# Patient Record
Sex: Female | Born: 1990 | Race: White | Hispanic: No | Marital: Single | Smoking: Never smoker
Health system: Southern US, Community
[De-identification: ages and names within clinical notes are randomized; demographics above are authoritative.]

## PROBLEM LIST (undated history)

## (undated) ENCOUNTER — Inpatient Hospital Stay (HOSPITAL_COMMUNITY): Payer: Self-pay

## (undated) ENCOUNTER — Emergency Department (HOSPITAL_COMMUNITY): Admission: EM | Payer: Managed Care, Other (non HMO)

## (undated) DIAGNOSIS — F319 Bipolar disorder, unspecified: Secondary | ICD-10-CM

## (undated) DIAGNOSIS — F419 Anxiety disorder, unspecified: Secondary | ICD-10-CM

## (undated) DIAGNOSIS — F329 Major depressive disorder, single episode, unspecified: Secondary | ICD-10-CM

## (undated) DIAGNOSIS — S83519A Sprain of anterior cruciate ligament of unspecified knee, initial encounter: Secondary | ICD-10-CM

## (undated) DIAGNOSIS — F429 Obsessive-compulsive disorder, unspecified: Secondary | ICD-10-CM

## (undated) DIAGNOSIS — M419 Scoliosis, unspecified: Secondary | ICD-10-CM

## (undated) DIAGNOSIS — F32A Depression, unspecified: Secondary | ICD-10-CM

## (undated) DIAGNOSIS — F22 Delusional disorders: Secondary | ICD-10-CM

## (undated) HISTORY — PX: COSMETIC SURGERY: SHX468

---
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
## 2010-10-13 ENCOUNTER — Emergency Department (HOSPITAL_COMMUNITY)
Admission: EM | Admit: 2010-10-13 | Discharge: 2010-10-13 | Payer: Self-pay | Source: Home / Self Care | Admitting: Family Medicine

## 2010-12-06 ENCOUNTER — Other Ambulatory Visit: Payer: Self-pay | Admitting: Obstetrics and Gynecology

## 2010-12-06 DIAGNOSIS — N6320 Unspecified lump in the left breast, unspecified quadrant: Secondary | ICD-10-CM

## 2010-12-13 ENCOUNTER — Other Ambulatory Visit: Payer: Self-pay

## 2011-07-31 LAB — COMPREHENSIVE METABOLIC PANEL
Albumin: 4
BUN: 13
Calcium: 9.1
Creatinine, Ser: 0.78
Glucose, Bld: 86
Total Protein: 6.5

## 2011-07-31 LAB — CBC
HCT: 37.3
Hemoglobin: 13.1
MCHC: 35.2
MCV: 85.7
Platelets: 263
RDW: 13.1

## 2011-07-31 LAB — DIFFERENTIAL
Eosinophils Absolute: 0
Eosinophils Relative: 1
Lymphs Abs: 1.4
Monocytes Relative: 7

## 2011-07-31 LAB — POCT I-STAT, CHEM 8
Chloride: 108
Creatinine, Ser: 1
Glucose, Bld: 82
Potassium: 3.6

## 2011-07-31 LAB — RAPID URINE DRUG SCREEN, HOSP PERFORMED
Benzodiazepines: NOT DETECTED
Cocaine: NOT DETECTED
Tetrahydrocannabinol: NOT DETECTED

## 2011-07-31 LAB — ETHANOL: Alcohol, Ethyl (B): 16 — ABNORMAL HIGH

## 2011-08-08 ENCOUNTER — Ambulatory Visit
Admission: RE | Admit: 2011-08-08 | Discharge: 2011-08-08 | Disposition: A | Discharge status: Home / Self Care | Payer: Self-pay | Source: Ambulatory Visit | Attending: Obstetrics and Gynecology | Admitting: Obstetrics and Gynecology

## 2011-08-08 DIAGNOSIS — N6320 Unspecified lump in the left breast, unspecified quadrant: Secondary | ICD-10-CM

## 2012-02-06 ENCOUNTER — Encounter (HOSPITAL_COMMUNITY): Payer: Self-pay | Admitting: Family Medicine

## 2012-02-06 ENCOUNTER — Emergency Department (HOSPITAL_COMMUNITY)
Admission: EM | Admit: 2012-02-06 | Discharge: 2012-02-07 | Disposition: A | Discharge status: Home / Self Care | Payer: Managed Care, Other (non HMO) | Attending: Emergency Medicine | Admitting: Emergency Medicine

## 2012-02-06 DIAGNOSIS — F329 Major depressive disorder, single episode, unspecified: Secondary | ICD-10-CM

## 2012-02-06 DIAGNOSIS — F3289 Other specified depressive episodes: Secondary | ICD-10-CM | POA: Insufficient documentation

## 2012-02-06 HISTORY — DX: Obsessive-compulsive disorder, unspecified: F42.9

## 2012-02-06 HISTORY — DX: Anxiety disorder, unspecified: F41.9

## 2012-02-06 HISTORY — DX: Delusional disorders: F22

## 2012-02-06 LAB — CBC
Hemoglobin: 13.9 g/dL (ref 12.0–15.0)
MCHC: 33.9 g/dL (ref 30.0–36.0)
Platelets: 327 10*3/uL (ref 150–400)
RBC: 4.79 MIL/uL (ref 3.87–5.11)

## 2012-02-06 LAB — COMPREHENSIVE METABOLIC PANEL
ALT: 17 U/L (ref 0–35)
AST: 19 U/L (ref 0–37)
Albumin: 4.7 g/dL (ref 3.5–5.2)
Alkaline Phosphatase: 77 U/L (ref 39–117)
GFR calc Af Amer: >90 mL/min (ref >90)
Glucose, Bld: 84 mg/dL (ref 70–99)
Potassium: 3.1 mEq/L — ABNORMAL LOW (ref 3.5–5.1)
Sodium: 137 mEq/L (ref 135–145)
Total Protein: 7.9 g/dL (ref 6.0–8.3)

## 2012-02-06 LAB — ACETAMINOPHEN LEVEL: Acetaminophen (Tylenol), Serum: <15 ug/mL (ref 10–30)

## 2012-02-06 LAB — SALICYLATE LEVEL: Salicylate Lvl: <2 mg/dL — ABNORMAL LOW (ref 2.8–20.0)

## 2012-02-06 NOTE — ED Notes (Addendum)
Patient states "I am here to get help so my mom can be happy." Mom states that she is seeing changes in the patient. Patient states that she got her meds today and "I should be fine."

## 2012-02-07 LAB — URINE MICROSCOPIC-ADD ON

## 2012-02-07 LAB — URINALYSIS, ROUTINE W REFLEX MICROSCOPIC
Ketones, ur: 15 mg/dL — AB
Leukocytes, UA: NEGATIVE
Nitrite: NEGATIVE
Protein, ur: NEGATIVE mg/dL

## 2012-02-07 LAB — RAPID URINE DRUG SCREEN, HOSP PERFORMED: Amphetamines: NOT DETECTED

## 2012-02-07 MED ORDER — ARIPIPRAZOLE 5 MG PO TABS
5.0000 mg | ORAL_TABLET | Freq: Every day | ORAL | Status: DC
  Filled 2012-02-07: qty 1

## 2012-02-07 MED ORDER — ATOMOXETINE HCL 25 MG PO CAPS: 25.0000 mg | ORAL_CAPSULE | Freq: Every day | ORAL | Status: DC

## 2012-02-07 MED ORDER — LORAZEPAM 1 MG PO TABS: 1.0000 mg | ORAL_TABLET | Freq: Three times a day (TID) | ORAL | Status: DC | PRN

## 2012-02-07 MED ORDER — ZOLPIDEM TARTRATE 5 MG PO TABS: 5.0000 mg | ORAL_TABLET | Freq: Every evening | ORAL | Status: DC | PRN

## 2012-02-07 MED ORDER — ACETAMINOPHEN 325 MG PO TABS: 650.0000 mg | ORAL_TABLET | ORAL | Status: DC | PRN

## 2012-02-07 MED ORDER — ONDANSETRON HCL 4 MG PO TABS: 4.0000 mg | ORAL_TABLET | Freq: Three times a day (TID) | ORAL | Status: DC | PRN

## 2012-02-07 MED ORDER — ALUM & MAG HYDROXIDE-SIMETH 200-200-20 MG/5ML PO SUSP: 30.0000 mL | ORAL | Status: DC | PRN

## 2012-02-07 MED ORDER — POTASSIUM CHLORIDE CRYS ER 20 MEQ PO TBCR
40.0000 meq | EXTENDED_RELEASE_TABLET | Freq: Once | ORAL | Status: AC
  Administered 2012-02-07: 40 meq via ORAL
  Filled 2012-02-07: qty 2

## 2012-02-07 MED ORDER — IBUPROFEN 600 MG PO TABS: 600.0000 mg | ORAL_TABLET | Freq: Three times a day (TID) | ORAL | Status: DC | PRN

## 2012-02-07 MED ORDER — SERTRALINE HCL 50 MG PO TABS
150.0000 mg | ORAL_TABLET | Freq: Every day | ORAL | Status: DC
  Administered 2012-02-07: 150 mg via ORAL
  Filled 2012-02-07: qty 3

## 2012-02-07 NOTE — ED Provider Notes (Signed)
History     CSN: [id number]  Arrival date & time 02/06/12  2054   First MD Initiated Contact with Patient 02/06/12 2311      Chief Complaint  Patient presents with  . Medical Clearance    (Consider location/radiation/quality/duration/timing/severity/associated sxs/prior treatment) The history is provided by the patient, a relative and a parent.  21 y/o F with hx psychotic episodes when not on medication for OCD/anxiety/paranoia presents to ED with c/c of erratic behavior and request for medical clearance per mother. Pt denies any complaints and does not feel she needs to be here. Denies recreational drug use. Occasional alcohol use. Denies SI, HI, hallucinations. Per pt mother, pt did not take her medication for several weeks and for the last few days has begun to display increasingly erratic behavior similar to prior episodes of psychosis. Behavior includes meeting up with strangers from the internet, riding a known combative horse without a saddle down a busy street, attempting to go to a public event in soiled clothes. Her roommates recently kicked her out of the apartment and her boyfriend broke up with her this weekend as a result of her recent behavior. Medication was refilled yesterday and patient has taken it since that time, but her mother expresses concern that it typically takes a few weeks to work. She would like for the patient to have some form of inpatient care while her behavior is erratic.  Past Medical History  Diagnosis Date  . Obsessive compulsive disorder   . Anxiety   . Paranoia     History reviewed. No pertinent past surgical history.  History reviewed. No pertinent family history.  History  Substance Use Topics  . Smoking status: Never Smoker   . Smokeless tobacco: Not on file  . Alcohol Use: Yes     Occasional     Review of Systems 10 systems reviewed and are negative for acute change except as noted in the HPI.  Allergies  Review of patient's  allergies indicates no known allergies.  Home Medications   Current Outpatient Rx  Name Route Sig Dispense Refill  . ARIPIPRAZOLE 5 MG PO TABS Oral Take 5 mg by mouth daily.    . ATOMOXETINE HCL 25 MG PO CAPS Oral Take 25 mg by mouth daily.    Marland Kitchen DM-GUAIFENESIN ER 30-600 MG PO TB12 Oral Take 1 tablet by mouth every 12 (twelve) hours.    . SERTRALINE HCL 100 MG PO TABS Oral Take 150 mg by mouth daily.      BP 107/66  Pulse 91  Temp(Src) 98 F (36.7 C) (Oral)  Resp 18  SpO2 100%  LMP 02/06/2012  Physical Exam  Nursing note and vitals reviewed. Constitutional: She is oriented to person, place, and time. She appears well-developed and well-nourished. No distress.  HENT:  Head: Normocephalic and atraumatic.  Right Ear: External ear normal.  Left Ear: External ear normal.  Mouth/Throat: Oropharynx is clear and moist.  Eyes: Pupils are equal, round, and reactive to light.       Bilateral conjunctiva injected, mascara smudged around eyes as if pt has been crying  Neck: Normal range of motion. Neck supple.  Cardiovascular: Normal rate and regular rhythm.   Pulmonary/Chest: Effort normal. No respiratory distress.  Abdominal: Soft. She exhibits no distension. There is no tenderness.  Musculoskeletal: She exhibits no edema and no tenderness.  Neurological: She is alert and oriented to person, place, and time. No cranial nerve deficit. Coordination normal.  Skin: Skin is warm and  dry. No rash noted.  Psychiatric: Her mood appears anxious. Rapid and pressured: speech alternates between pressured and delayed. She is agitated and is hyperactive. She is not actively hallucinating. She expresses impulsivity and inappropriate judgment. She expresses no homicidal and no suicidal ideation. She is inattentive.    ED Course  Procedures (including critical care time)  Labs Reviewed  SALICYLATE LEVEL - Abnormal; Notable for the following:    Salicylate Lvl <2.0 (*)    All other components within  normal limits  COMPREHENSIVE METABOLIC PANEL - Abnormal; Notable for the following:    Potassium 3.1 (*)    All other components within normal limits  ACETAMINOPHEN LEVEL  CBC  POCT PREGNANCY, URINE  URINALYSIS, ROUTINE W REFLEX MICROSCOPIC  URINE RAPID DRUG SCREEN (HOSP PERFORMED)   No results found.    MDM  Manic behaviors in pt with known history of dangerous behavior when not taking psychiatric medications. ACT team consulted for hopeful placement. Will supplement hypokalemia.      1:59 AM EDP [facility] is aware of this patient and will monitor overnight while awaiting placement.  Shaaron Adler, [place] 02/07/12 [phone]

## 2012-02-07 NOTE — ED Notes (Signed)
telepsych in progress 

## 2012-02-07 NOTE — ED Notes (Signed)
Dc instructions reviewed w/ pt and mom.  Pt verbalized understanding.  Pt encouraged to take her medications as directed and follow up w/ her dr at [facility].

## 2012-02-07 NOTE — ED Provider Notes (Addendum)
7:43 AM  Resting comfortable. Will ask telepsych to evaluate the patient at this time for disposition questions. May represent medication noncompliance. Hx of OCD, anxiety and paranoia  Filed Vitals:   02/07/12 0530  BP: 95/59  Pulse: 89  Temp: 97.8 F (36.6 C)  Resp: 20     Lyanne Co, MD 02/07/12 0743  1:15 PM The patient was seen and evaluated by the psychiatrist Dr. Henderson Cloud who recommends discharge home without any changes in her medications.  He reports he is not a threat to herself or to others and is stable for outpatient treatment and therapy.  The patient is agreeable to outpatient plan  Lyanne Co, MD 02/07/12 1315

## 2012-02-07 NOTE — ED Notes (Signed)
telepsych info refaxed--origional fax did not go thru
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
## 2012-02-07 NOTE — ED Notes (Signed)
Pt's mom into see 

## 2012-02-07 NOTE — ED Provider Notes (Signed)
Medical screening examination/treatment/procedure(s) were performed by non-physician practitioner and as supervising physician I was immediately available for consultation/collaboration.   Gwyneth Sprout, MD 02/07/12 2113

## 2012-02-16 ENCOUNTER — Encounter (HOSPITAL_COMMUNITY): Payer: Self-pay | Admitting: Emergency Medicine

## 2012-02-16 ENCOUNTER — Emergency Department (HOSPITAL_COMMUNITY)
Admission: EM | Admit: 2012-02-16 | Discharge: 2012-02-16 | Disposition: A | Discharge status: Other Acute Inpatient Hospital | Payer: Managed Care, Other (non HMO) | Attending: Emergency Medicine | Admitting: Emergency Medicine

## 2012-02-16 ENCOUNTER — Inpatient Hospital Stay (HOSPITAL_COMMUNITY)
Admission: AD | Admit: 2012-02-16 | Discharge: 2012-03-01 | DRG: 885 | Disposition: A | Discharge status: Home / Self Care | Payer: Managed Care, Other (non HMO) | Source: Ambulatory Visit | Attending: Psychiatry | Admitting: Psychiatry

## 2012-02-16 DIAGNOSIS — Z79899 Other long term (current) drug therapy: Secondary | ICD-10-CM | POA: Insufficient documentation

## 2012-02-16 DIAGNOSIS — F411 Generalized anxiety disorder: Secondary | ICD-10-CM | POA: Diagnosis present

## 2012-02-16 DIAGNOSIS — F429 Obsessive-compulsive disorder, unspecified: Secondary | ICD-10-CM | POA: Diagnosis present

## 2012-02-16 DIAGNOSIS — R4586 Emotional lability: Secondary | ICD-10-CM | POA: Insufficient documentation

## 2012-02-16 DIAGNOSIS — F22 Delusional disorders: Secondary | ICD-10-CM | POA: Insufficient documentation

## 2012-02-16 DIAGNOSIS — F250 Schizoaffective disorder, bipolar type: Secondary | ICD-10-CM | POA: Diagnosis present

## 2012-02-16 DIAGNOSIS — F332 Major depressive disorder, recurrent severe without psychotic features: Secondary | ICD-10-CM | POA: Diagnosis present

## 2012-02-16 DIAGNOSIS — F339 Major depressive disorder, recurrent, unspecified: Secondary | ICD-10-CM | POA: Diagnosis present

## 2012-02-16 DIAGNOSIS — F259 Schizoaffective disorder, unspecified: Principal | ICD-10-CM | POA: Diagnosis present

## 2012-02-16 DIAGNOSIS — E876 Hypokalemia: Secondary | ICD-10-CM | POA: Diagnosis present

## 2012-02-16 DIAGNOSIS — F29 Unspecified psychosis not due to a substance or known physiological condition: Secondary | ICD-10-CM

## 2012-02-16 LAB — CBC
HCT: 39.2 % (ref 36.0–46.0)
Hemoglobin: 13.3 g/dL (ref 12.0–15.0)
MCH: 28.9 pg (ref 26.0–34.0)
MCHC: 33.9 g/dL (ref 30.0–36.0)
MCV: 85.2 fL (ref 78.0–100.0)
Platelets: 370 10*3/uL (ref 150–400)
RBC: 4.6 MIL/uL (ref 3.87–5.11)
RDW: 14.1 % (ref 11.5–15.5)
WBC: 8 K/uL (ref 4.0–10.5)

## 2012-02-16 LAB — COMPREHENSIVE METABOLIC PANEL WITH GFR
BUN: 13 mg/dL (ref 6–23)
CO2: 23 meq/L (ref 19–32)
Chloride: 102 meq/L (ref 96–112)
Creatinine, Ser: 0.74 mg/dL (ref 0.50–1.10)
GFR calc Af Amer: >90 mL/min (ref >90)
GFR calc non Af Amer: >90 mL/min (ref >90)
Total Bilirubin: 0.3 mg/dL (ref 0.3–1.2)

## 2012-02-16 LAB — RAPID URINE DRUG SCREEN, HOSP PERFORMED
Amphetamines: NOT DETECTED
Barbiturates: NOT DETECTED
Benzodiazepines: NOT DETECTED
Cocaine: NOT DETECTED
Opiates: NOT DETECTED
Tetrahydrocannabinol: NOT DETECTED

## 2012-02-16 LAB — POCT PREGNANCY, URINE: Preg Test, Ur: NEGATIVE

## 2012-02-16 LAB — COMPREHENSIVE METABOLIC PANEL
ALT: 17 U/L (ref 0–35)
AST: 20 U/L (ref 0–37)
Albumin: 4.4 g/dL (ref 3.5–5.2)
Alkaline Phosphatase: 77 U/L (ref 39–117)
Calcium: 9.6 mg/dL (ref 8.4–10.5)
Glucose, Bld: 106 mg/dL — ABNORMAL HIGH (ref 70–99)
Potassium: 3.3 mEq/L — ABNORMAL LOW (ref 3.5–5.1)
Sodium: 138 mEq/L (ref 135–145)
Total Protein: 7.6 g/dL (ref 6.0–8.3)

## 2012-02-16 LAB — ETHANOL: Alcohol, Ethyl (B): <11 mg/dL (ref 0–11)

## 2012-02-16 MED ORDER — SERTRALINE HCL 50 MG PO TABS
150.0000 mg | ORAL_TABLET | Freq: Every day | ORAL | Status: DC
  Administered 2012-02-17: 150 mg via ORAL
  Filled 2012-02-16 (×3): qty 1

## 2012-02-16 MED ORDER — ALUM & MAG HYDROXIDE-SIMETH 200-200-20 MG/5ML PO SUSP: 30.0000 mL | ORAL | Status: DC | PRN

## 2012-02-16 MED ORDER — ARIPIPRAZOLE 5 MG PO TABS
5.0000 mg | ORAL_TABLET | Freq: Every day | ORAL | Status: DC
  Administered 2012-02-17 – 2012-02-18 (×2): 5 mg via ORAL
  Filled 2012-02-16 (×3): qty 1

## 2012-02-16 MED ORDER — MAGNESIUM HYDROXIDE 400 MG/5ML PO SUSP: 30.0000 mL | Freq: Every day | ORAL | Status: DC | PRN

## 2012-02-16 MED ORDER — POTASSIUM CHLORIDE CRYS ER 20 MEQ PO TBCR
20.0000 meq | EXTENDED_RELEASE_TABLET | Freq: Once | ORAL | Status: AC
  Administered 2012-02-16: 20 meq via ORAL
  Filled 2012-02-16: qty 1

## 2012-02-16 MED ORDER — ACETAMINOPHEN 325 MG PO TABS
650.0000 mg | ORAL_TABLET | Freq: Four times a day (QID) | ORAL | Status: DC | PRN
  Administered 2012-02-25: 650 mg via ORAL

## 2012-02-16 NOTE — ED Notes (Signed)
Pt asleep, awaiting ACT recommendations.

## 2012-02-16 NOTE — ED Notes (Addendum)
Pt informed that she is going to transfer to behavioral health facility. Gave permission to inform her mother.
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
## 2012-02-16 NOTE — ED Notes (Signed)
Mother took all of patient's belongings. Patient wanded by security.
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
## 2012-02-16 NOTE — ED Notes (Addendum)
Per [facility]'s report, pt verbalized that "I don't feel safe."  Reports states that pt is paranoid, wanted to start a non-profit organization so she opened a facebook account but now is getting too many requests.  Is unsafe at home and at work.  States "I have offended someone", Pt's mother feels that pt is non-compliant with her meds.  Denies SI/HI at this time.  Pt is calm and cooperative at this time.

## 2012-02-16 NOTE — ED Notes (Signed)
Tele psyche completed 

## 2012-02-16 NOTE — ED Notes (Signed)
Pt mother called with concerns after hanging up with patient on the phone patient continuing medications and continuing care with her psychiatric, Dr. Erick Colace or Dr. Kirtland Bouchard.  Mother phone number is [phone]

## 2012-02-16 NOTE — ED Notes (Signed)
Pt. Is upset and crying after tele- psych consult. Safety sitter at bedside talking with pt. She is cooperative.

## 2012-02-16 NOTE — [facility] Counselor (Signed)
Tele psych in progress at this time. 

## 2012-02-16 NOTE — ED Provider Notes (Signed)
History     CSN: [id number]  Arrival date & time 02/16/12  4098   First MD Initiated Contact with Patient 02/16/12 1146      Chief Complaint  Patient presents with  . Medical Clearance    from [facility]    (Consider location/radiation/quality/duration/timing/severity/associated sxs/prior treatment) HPI Comments: Pt with a h/o OCD, anxiety is sent here from [facility] with pt's mother due to paranoia and possibly new onset of psychosis.  Pt is somewhat paranoid, thinking her father is sending her messages through radio commercials.  She also describes on Facebook, she has upset others and she is worried that she is getting so many friend requests by people unknown to her and thinks they are invading her pivacy and feels unsafe.  No overt SI or HI.  She has seen psychiatrists on and off for years.  I spoke to psychiatrist at [facility] who felt pt needed inpatient treatment, but they had no holding beds available at present so mother brought her here. Level 5 due to psychosis   The history is provided by the patient and a relative.    Past Medical History  Diagnosis Date  . Obsessive compulsive disorder   . Anxiety   . Paranoia     History reviewed. No pertinent past surgical history.  History reviewed. No pertinent family history.  History  Substance Use Topics  . Smoking status: Never Smoker   . Smokeless tobacco: Not on file  . Alcohol Use: Yes     Occasional    OB History    Grav Para Term Preterm Abortions TAB SAB Ect Mult Living                  Review of Systems  Unable to perform ROS: Psychiatric disorder    Allergies  Review of patient's allergies indicates no known allergies.  Home Medications   Current Outpatient Rx  Name Route Sig Dispense Refill  . ARIPIPRAZOLE 5 MG PO TABS Oral Take 5 mg by mouth daily.    . ATOMOXETINE HCL 25 MG PO CAPS Oral Take 25 mg by mouth daily.    Marland Kitchen DM-GUAIFENESIN ER 30-600 MG PO TB12 Oral Take 1 tablet by mouth every 12 (twelve)  hours.    . SERTRALINE HCL 100 MG PO TABS Oral Take 150 mg by mouth daily.      BP 111/75  Pulse 66  Temp(Src) 97.8 F (36.6 C) (Oral)  Resp 16  SpO2 100%  LMP 02/06/2012  Physical Exam  Constitutional: She is oriented to person, place, and time. She appears well-developed and well-nourished. No distress.  HENT:  Head: Normocephalic and atraumatic.  Eyes: Pupils are equal, round, and reactive to light. No scleral icterus.  Neck: Neck supple.  Cardiovascular: Normal rate.   Pulmonary/Chest: Effort normal and breath sounds normal.  Abdominal: Soft. Bowel sounds are normal.  Neurological: She is oriented to person, place, and time. Coordination normal.  Skin: Skin is warm and dry. She is not diaphoretic.  Psychiatric: Her affect is blunt and labile. Her affect is not angry. Her speech is rapid and/or pressured. Thought content is delusional. She does not exhibit a depressed mood. She expresses no homicidal and no suicidal ideation.    ED Course  Procedures (including critical care time)  Labs Reviewed  COMPREHENSIVE METABOLIC PANEL - Abnormal; Notable for the following:    Potassium 3.3 (*)    Glucose, Bld 106 (*)    All other components within normal limits  CBC  ETHANOL  URINE RAPID DRUG SCREEN (HOSP PERFORMED)  POCT PREGNANCY, URINE   No results found.   1. Psychosis    Medically clear.  K+ is minimally low, will likely normalize with proper diet, given 1 oral replacement tablet here in the ED.     MDM  Pt with admitted delusions, seems somewhat manic in behavior and has described some paranoid thoughts.  Pt wishes to be somewhere safe.  No SI or HI. Is here voluntarily for now.  I have spoken to [facility] who will see pt in the ED.          Gavin Pound. Skylah Delauter, MD 02/16/12 1310

## 2012-02-16 NOTE — ED Notes (Signed)
Pt updated on plan of care. Awaiting psychiatrist to see pt.

## 2012-02-17 DIAGNOSIS — F429 Obsessive-compulsive disorder, unspecified: Secondary | ICD-10-CM | POA: Diagnosis present

## 2012-02-17 DIAGNOSIS — F411 Generalized anxiety disorder: Secondary | ICD-10-CM

## 2012-02-17 DIAGNOSIS — F339 Major depressive disorder, recurrent, unspecified: Secondary | ICD-10-CM | POA: Diagnosis present

## 2012-02-17 DIAGNOSIS — F259 Schizoaffective disorder, unspecified: Principal | ICD-10-CM

## 2012-02-17 MED ORDER — POTASSIUM CHLORIDE CRYS ER 20 MEQ PO TBCR
20.0000 meq | EXTENDED_RELEASE_TABLET | Freq: Two times a day (BID) | ORAL | Status: DC
  Administered 2012-02-17 – 2012-02-19 (×4): 20 meq via ORAL
  Filled 2012-02-17 (×5): qty 1

## 2012-02-17 MED ORDER — SERTRALINE HCL 100 MG PO TABS
200.0000 mg | ORAL_TABLET | Freq: Every day | ORAL | Status: DC
  Administered 2012-02-18 – 2012-03-01 (×13): 200 mg via ORAL
  Filled 2012-02-17: qty 14
  Filled 2012-02-17 (×3): qty 2
  Filled 2012-02-17: qty 14
  Filled 2012-02-17 (×5): qty 2
  Filled 2012-02-17: qty 14
  Filled 2012-02-17 (×4): qty 2
  Filled 2012-02-17: qty 14

## 2012-02-17 MED ORDER — TRAZODONE HCL 50 MG PO TABS
50.0000 mg | ORAL_TABLET | Freq: Every evening | ORAL | Status: DC | PRN
  Administered 2012-02-17 – 2012-02-19 (×5): 50 mg via ORAL
  Filled 2012-02-17 (×13): qty 1

## 2012-02-17 MED ORDER — HALOPERIDOL 1 MG PO TABS
2.0000 mg | ORAL_TABLET | Freq: Four times a day (QID) | ORAL | Status: DC | PRN
  Administered 2012-02-17 – 2012-02-22 (×6): 2 mg via ORAL
  Filled 2012-02-17 (×6): qty 2
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
## 2012-02-17 NOTE — Progress Notes (Signed)
[facility] Group Notes:  (Counselor/Nursing/MHT/Case Management/Adjunct)  02/17/2012 1:53 PM  Type of Therapy:  Group Therapy  Participation Level:  Active  Participation Quality:  Attentive and Sharing  Affect:  Anxious  Cognitive:  Oriented  Insight:  Limited  Engagement in Group:  Good  Engagement in Therapy:  Good  Modes of Intervention:  Education, Limit-setting and Support  Summary of Progress/Problems: Patient talked in very global terms about the state of affairs and how this made people like her more anxious. Had difficulty focusing just on herself. Gave another peer feedback about her observations but became concerned that she had made him leave the group.  Opal Mckellips, Aram Beecham 02/17/2012, 1:53 PM
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
## 2012-02-17 NOTE — Progress Notes (Signed)
Says no prior psychiatric hospitalizations.  Unable to state  why she is here.  Denies SI, HI, or A/V hallucinations. Acknowledges feeling  frightened,  Untrusting, and  Sad. Tearful.  Tangential statements about needing to go home to help Aunt care for and feed the horses and pigs.  Says doctors have said she has OCD and anxiety.  Unable to give accurate account of home meds or last time these were taken.  Mental status evaluation reveals minimal  confused thinking, anxiety, and possible paranoia.   She is verbal and cooperative. Oriented to unit and assisted to bed.

## 2012-02-17 NOTE — Tx Team (Signed)
Interdisciplinary Treatment Plan Update (Adult)  Date:  02/17/2012  Time Reviewed:  10:15AM-11:00AM  Progress in Treatment: Attending groups:  Yes Participating in groups:    Yes Taking medication as prescribed:    Yes Tolerating medication:  Yes  Family/Significant other contact made:  Yes, counselor spoke with mother Patient understands diagnosis:   Limited insight Discussing patient identified problems/goals with staff:   Yes, fully engaged Medical problems stabilized or resolved:   Yes Denies suicidal/homicidal ideation:  Yes Issues/concerns per patient self-inventory:   None Other:    New problem(s) identified: No, Describe:    Reason for Continuation of Hospitalization: Anxiety Depression Medication stabilization Other; describe OCD symptoms, racing thoughts, fear, paranoia  Interventions implemented related to continuation of hospitalization:  Medication monitoring and adjustment, safety checks Q15 min., suicide risk assessment, group therapy, psychoeducation, collateral contact, aftercare planning, ongoing physician assessments, medication education  Additional comments:  Patient states "I see the world as so much different than when I got here.  I see a lot of flags lately, I don't know why."  Discusses some OCD issues, both past and current.  Estimated length of stay:  5-6 days  Discharge Plan:  Unknown at this point, states she does not want to return to aunt's house to live.  Unknown re follow-up.  New goal(s):  Not applicable  Review of initial/current patient goals per problem list:   1.  Goal(s):  Reduce paranoia to baseline per patient and family.  Met:  No  Target date:  By Discharge   As evidenced by:  Remains paranoid, but reports she feels safe in the hospital.  2.  Goal(s):  Reduce anxiety from admission level to no greater than 3 at discharge.  Met:  No  Target date:  By Discharge   As evidenced by:  "I get anxiety when people are mean, start  acting like they're going to fight or won't listen."  Problems in group this morning with several other residents, all for different reasons, all provoke anxiety in patient.  3.  Goal(s):  Determine aftercare placement.  Met:  No  Target date:  By Discharge   As evidenced by:    4.  Goal(s):  Medication stabilization so OCD symptoms under control.  Met:  No  Target date:  By Discharge   As evidenced by:  Still c/o OCD sx, displays these  5.  Goal(s):  Reduce racing thoughts to baseline.  Met:  No  Target date:  By Discharge   As evidenced by: Still racing  Attendees: Patient:  Cheryl Acevedo  02/17/2012 10:15AM-11:00AM  Family:     Physician:  Dr. Harvie Heck Readling 02/17/2012 10:15AM-11:00AM  Nursing:   Neill Loft, RN 02/17/2012 10:15AM -11:00AM   Case Manager:  Ambrose Mantle, LCSW 02/17/2012 10:15AM-11:00AM  Counselor:  Veto Kemps, MT-BC 02/17/2012 10:15AM-11:00AM  Other:   Lynann Bologna, NP 02/17/2012 10:15AM-11:00AM  Other:      Other:      Other:       Scribe for Treatment Team:   Sarina Ser, 02/17/2012, 10:15AM-11:15AM

## 2012-02-17 NOTE — Progress Notes (Signed)
Pt reported before dinner that she was actively hallucinating. Pt was very anxious & wanted a med to help her.Pt. Reports still having some confusion & also racing thoughts.Positive for [facility].Dr Allena Katz was made aware & haldol PRN was ordered. Pt. Was given the 2 mg. Haldol @ 1827 with good relief. Continues on 15 minute checks. Pt.safety maintained. Supported & encouraged.

## 2012-02-17 NOTE — [facility] Suicide Risk Assessment (Signed)
Suicide Risk Assessment  Admission Assessment     Demographic factors:  Assessment Details Time of Assessment: Admission Information Obtained From: Patient Current Mental Status:  AO x 3. Loss Factors:    Historical Factors:  Historical Factors: Family history of suicide Risk Reduction Factors:  Risk Reduction Factors: Sense of responsibility to family  CLINICAL FACTORS:   Severe Anxiety and/or Agitation Depression:   Anhedonia Obsessive-Compulsive Disorder More than one psychiatric diagnosis Previous Psychiatric Diagnoses and Treatments  COGNITIVE FEATURES THAT CONTRIBUTE TO RISK:  None Noted.   Diagnosis:  Axis I: Major Depressive Disorder - Recurrent. Obsessive Compulsive Disorder. Generalized Anxiety Disorder.  The patient was seen today and reports the following:   ADL's: Intact.  Sleep: The patient reports to sleeping well last night.  Appetite: The patient reports a good appetite today.   Mild>(1-10) >Severe  Hopelessness (1-10): 0  Depression (1-10): 0  Anxiety (1-10): 5   Suicidal Ideation: The patient denies any suicidal ideations today.  Plan: No  Intent: No  Means: No  Homicidal Ideation: The patient denies any homicidal ideations today.  Plan: No  Intent: No.  Means: No   General Appearance/Behavior: The patient was mostly cooperative with this provider today but appeared more depressed than she reports. Eye Contact: Good.  Speech: Appropriate in rate and volume with no pressuring noted today.  Motor Behavior: wnl.  Level of Consciousness: Alert and Oriented x 3.  Mental Status: Alert and Oriented x 3.  Mood: Moderately Depressed.  Affect: Moderately Constricted and tearful today.  Anxiety Level: Moderate anxiety reported today.  Thought Process: wnl.  Thought Content: The patient denies any auditory or visual hallucinations today. The patient denies any paranoid delusions today but states that she feels "someone is out to get me."  Perception:.  wnl.  Judgment: Fair.  Insight: Fair.  Cognition: Oriented to person, place and time.   COGNITIVE FEATURES THAT CONTRIBUTE TO RISK:  Polarized thinking   Time was spent today discussing with the patient the situation leading to her admission. The patient states that she was having "trust issues" and did not feel safe.  She reports a long history of OCD, depression and anxiety and had become non-compliant with her medications with a resultant worsening of her symptoms.  The patient states that she is in the hospital for medication stabilization and further treatment of her psychiatric symptoms.  Treatment Plan Summary:  1. Daily contact with patient to assess and evaluate symptoms and progress in treatment  2. Medication management  3. The patient will deny suicidal ideations or homicidal ideations for 48 hours prior to discharge and have a depression and anxiety rating of 3 or less. The patient will also deny any auditory or visual hallucinations or delusional thinking.  4. The patient will deny any symptoms of substance withdrawal at time of discharge.   Plan:  1. The patient was restarted on the medication Zoloft but at the increased dosage of 200 mgs po q am to further address her depression, anxiety and OCD symptoms. 2. The patient was restarted on the medication Abilify at 5 mgs po q am to provide mood stabilization  3. The patient was started on the medication KCL 20 mEq po BID x 5 doses for hypokalemia.  4. The patient was started on the medication Trazodone 50 mgs po qhs for sleep.  5. Laboratory studies reviewed.  6. Will continue to monitor.   SUICIDE RISK:  Minimal: No identifiable suicidal ideation.  Patients presenting with no  risk factors but with morbid ruminations; may be classified as minimal risk based on the severity of the depressive symptoms  Abdur Hoglund 02/17/2012, 2:24 PM
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
## 2012-02-17 NOTE — Progress Notes (Signed)
Patient ID: Cheryl Acevedo, female   DOB: 1991/05/08, 20 y.o.   MRN: [id number] Pt attended treatment team and became tearful talking about how she is feeling.  She says that her mind doesn't work the same when she is not taking her meds.  She shared a picture of a heart that had dark areas and says that she is experiencing some confusion and some racing thoughts.  Pt talked about being afraid of her father and deciding he is not a positive person in her life.  Pt has been attending groups and interacting with peers and staff

## 2012-02-18 LAB — COMPREHENSIVE METABOLIC PANEL
AST: 17 U/L (ref 0–37)
Albumin: 4.3 g/dL (ref 3.5–5.2)
BUN: 17 mg/dL (ref 6–23)
Calcium: 9.6 mg/dL (ref 8.4–10.5)
Chloride: 104 mEq/L (ref 96–112)
Creatinine, Ser: 0.87 mg/dL (ref 0.50–1.10)
Total Bilirubin: 0.2 mg/dL — ABNORMAL LOW (ref 0.3–1.2)

## 2012-02-18 MED ORDER — ARIPIPRAZOLE 10 MG PO TABS
10.0000 mg | ORAL_TABLET | Freq: Every day | ORAL | Status: DC
  Administered 2012-02-19 – 2012-02-22 (×4): 10 mg via ORAL
  Filled 2012-02-18 (×6): qty 1

## 2012-02-18 NOTE — Discharge Planning (Signed)
Met with patient in Aftercare Planning Group.   While another patient spoke about their discharge plans, patient was giggling loudly on the other side of the room, upsetting the first person who expressed feeling that they were being laughed at.  Although another person complained also of the loud laughter and feeling targeted, patient continued laughing and said that she was laughing at something personal that was going on in her head.  She gave an appropriate response, but was not able to stop the behavior.  When called on, patient spent her time speaking about not wanting to be judged by staff because she asks so many questions.  She talked at length about being a curious person her whole life.  She expressed no case management needs today.  Ambrose Mantle, LCSW 02/18/2012, 10:03 AM

## 2012-02-18 NOTE — Progress Notes (Signed)
Lying quietly in bed with eyes closed.  No physical or behavioral problems reported or observed.  Q 15 minute safety checks, in progress.

## 2012-02-18 NOTE — Tx Team (Signed)
Initial Interdisciplinary Treatment Plan  PATIENT STRENGTHS: (choose at least two) Supportive family General fund of knowledge Supportive family/friends  PATIENT STRESSORS: Medication change or noncompliance Occupational concerns   PROBLEM LIST: Problem List/Patient Goals Date to be addressed Date deferred Reason deferred Estimated date of resolution  Anxiety 02/16/12     Confused thinking 02/16/12     Hx of OCD 02/16/12                                          DISCHARGE CRITERIA:  Ability to meet basic life and health needs Adequate post-discharge living arrangements Improved stabilization in mood, thinking, and/or behavior Medical problems require only outpatient monitoring Motivation to continue treatment in a less acute level of care Verbal commitment to aftercare and medication compliance  PRELIMINARY DISCHARGE PLAN: Outpatient therapy Participate in family therapy Return to previous living arrangement Return to previous work or school arrangements  PATIENT/FAMIILY INVOLVEMENT: This treatment plan has been presented to and reviewed with the patient, Cheryl Acevedo, and/or family member, .  The patient and family have been given the opportunity to ask questions and make suggestions.  [name] 02/18/2012, 1:56 AM

## 2012-02-18 NOTE — Progress Notes (Signed)
Recreation Therapy Notes  02/18/2012         Time: 0930      Group Topic/Focus: The focus of the group is on enhancing the patients' ability to cope with stressors by understanding what coping is, why it is important, the negative effects of stress and developing healthier coping skills.  Participation Level: Active  Participation Quality: Intrusive  Affect: Excited  Cognitive: Alert   Additional Comments: Patient speaking for other patients, staring into their eyes, saying she knows what they are saying. Patient easily distracted, requiring frequent redirection.   Klea Nall 02/18/2012 1:00 PM

## 2012-02-18 NOTE — Progress Notes (Signed)
[facility] MD Progress Note  02/18/2012 1:30 PM  Diagnosis:  Axis I: Major Depressive Disorder - Recurrent.  Obsessive Compulsive Disorder.  Generalized Anxiety Disorder.  Axis II:  Schizotypal Personality Disorder.  The patient was seen today and reports the following:   ADL's: Intact.  Sleep: The patient reports to sleeping well last night with the medication Trazodone.  Appetite: The patient reports a good appetite today.   Mild>(1-10) >Severe  Hopelessness (1-10): 0  Depression (1-10): 0  Anxiety (1-10): 3   Suicidal Ideation: The patient adamantly denies any suicidal ideations today.  Plan: No  Intent: No  Means: No   Homicidal Ideation: The patient adamantly denies any homicidal ideations today.  Plan: No  Intent: No.  Means: No   General Appearance/Behavior: The patient was friendly and cooperative with this provider today but again appeared more depressed than she reports.  Eye Contact: Good.  Speech: Appropriate in rate and volume with no pressuring noted today.  Motor Behavior: wnl.  Level of Consciousness: Alert and Oriented x 3.  Mental Status: Alert and Oriented x 3.  Mood: Mild to moderately depressed.  Affect: Mild to moderately constricted today.  Anxiety Level: Mild anxiety reported today.  Thought Process: The patient tends to discuss esoteric topics such as the significant of the Wizard of Oz and [place]. Thought Content: The patient denies any current auditory or visual hallucinations today. The patient denies any paranoid delusions today. Perception:. The patient states that at times she can see things happening out of the "corner of her eye" when speaking to others..  Judgment: Fair.  Insight: Fair.  Cognition: Oriented to person, place and time.  Sleep:  Number of Hours: 6.25    Vital Signs:Blood pressure 102/66, pulse 94, temperature 97.9 F (36.6 C), temperature source Oral, resp. rate 18, height 5\' 11"  (1.803 m), weight 68.493 kg (151 lb), last  menstrual period 02/06/2012.  Current Medications: Current Facility-Administered Medications  Medication Dose Route Frequency Provider Last Rate Last Dose  . acetaminophen (TYLENOL) tablet 650 mg  650 mg Oral Q6H PRN Verne Spurr, PA-C      . alum & mag hydroxide-simeth (MAALOX/MYLANTA) 200-200-20 MG/5ML suspension 30 mL  30 mL Oral Q4H PRN Verne Spurr, PA-C      . ARIPiprazole (ABILIFY) tablet 10 mg  10 mg Oral Daily Curlene Labrum Kortney Schoenfelder, MD      . haloperidol (HALDOL) tablet 2 mg  2 mg Oral Q6H PRN Ronny Bacon, MD   2 mg at 02/17/12 1827  . magnesium hydroxide (MILK OF MAGNESIA) suspension 30 mL  30 mL Oral Daily PRN Verne Spurr, PA-C      . potassium chloride SA (K-DUR,KLOR-CON) CR tablet 20 mEq  20 mEq Oral BID Curlene Labrum Israel Wunder, MD   20 mEq at 02/18/12 0758  . sertraline (ZOLOFT) tablet 200 mg  200 mg Oral Daily Curlene Labrum Darcia Lampi, MD   200 mg at 02/18/12 0758  . traZODone (DESYREL) tablet 50 mg  50 mg Oral QHS,MR X 1 Curlene Labrum Sonnie Pawloski, MD   50 mg at 02/17/12 2240  . DISCONTD: ARIPiprazole (ABILIFY) tablet 5 mg  5 mg Oral Daily Curlene Labrum Teng Decou, MD   5 mg at 02/18/12 0757  . DISCONTD: sertraline (ZOLOFT) tablet 150 mg  150 mg Oral Daily Verne Spurr, PA-C   150 mg at 02/17/12 1610   Lab Results: No results found for this or any previous visit (from the past 48 hour(s)).  Time was spent today discussing with  the patient her current symptoms.  The patient reports to having difficulty sleeping last night but slept well after being given the medication Trazodone. She denies any depressive symptoms but does appear to be mild to moderately depressed.  She denies any suicidal ideations.  She did discuss today that she finds significance in the Wizard of [place] as well as [place].  She also reports to being able to see things happening "in her mind" while speaking to others.  Treatment Plan Summary:  1. Daily contact with patient to assess and evaluate symptoms and progress in treatment  2.  Medication management  3. The patient will deny suicidal ideations or homicidal ideations for 48 hours prior to discharge and have a depression and anxiety rating of 3 or less. The patient will also deny any auditory or visual hallucinations or delusional thinking.  4. The patient will deny any symptoms of substance withdrawal at time of discharge.   Plan:  1. Will continue the patient on her current medications. 2. Will increase the medication Abilify to 10 mgs po q am to provide further mood stabilization and to address unusual thoughts.  3. Laboratory studies reviewed. 4. Will repeat a CMP, CBC with Diff, TSH, Free T3 and Free T4 to further assess the patient's metabolic state.  5. Will continue to monitor.   Pacey Willadsen 02/18/2012, 1:30 PM

## 2012-02-18 NOTE — Progress Notes (Signed)
[facility] Group Notes:  (Counselor/Nursing/MHT/Case Management/Adjunct)  02/18/2012 2:06 PM  Type of Therapy:  Psychoeducational Skills  Participation Level:  Active  Participation: Patient was attentive to speaker from mental health association. She made a statement to the speaker "has anyone ever told you that you are a positive version of a person that is negative?".  Shavontae Gibeault, Aram Beecham 02/18/2012, 2:06 PM

## 2012-02-18 NOTE — Progress Notes (Signed)
Patient ID: Cheryl Acevedo, female   DOB: 1991/07/26, 20 y.o.   MRN: [id number] Pt. Makes eye contact, but restless. Pt. Also laughs inappropriately when asked how her day was and says "anything that's a reality to me is a reality to you, if you know what I mean." Writer asked pt. to elaborate and pt. Responds "the things I've been seeing is reality, it helps makes things better for this world", "I'm reacting the best way I can". The pt. Also reports grogginess this am with Trazodone. "I was getting sleepy with one dose, but then I took a second one that zonked me out," "I think I need to just stick to one dose." Writer explained to pt. That this medication could be reduced if it was to strong for pt. But pt. Adamant that the one dose was sufficient.  Writer will monitor for med effectiveness and possible side effects. Staff will monitor q11min for safety.

## 2012-02-18 NOTE — Progress Notes (Signed)
Patient ID: Cheryl Acevedo, female   DOB: Feb 11, 1991, 20 y.o.   MRN: [id number] Pt denies SI/HI/AVH.  She reported on her self inventory that she slept well once she went to sleep, appetite good, energy level normal, ability to pay attention improving, 1/10 on hopelessness and depression, no pain.  Imaan did write, "zombified this morning" with feelings of lightheadedness, blurred vision, and some dizziness--care providers notified.

## 2012-02-19 LAB — T4, FREE: Free T4: 1.06 ng/dL (ref 0.80–1.80)

## 2012-02-19 LAB — T3, FREE: T3, Free: 3.1 pg/mL (ref 2.3–4.2)

## 2012-02-19 LAB — TSH: TSH: 0.626 u[IU]/mL (ref 0.350–4.500)

## 2012-02-19 MED ORDER — DIVALPROEX SODIUM ER 500 MG PO TB24
500.0000 mg | ORAL_TABLET | Freq: Every day | ORAL | Status: DC
  Administered 2012-02-19: 500 mg via ORAL
  Filled 2012-02-19 (×2): qty 1

## 2012-02-19 NOTE — Progress Notes (Signed)
[facility] Group Notes:  (Counselor/Nursing/MHT/Case Management/Adjunct)  02/19/2012 9:57 AM  Type of Therapy:  Group Therapy  Participation Level:  Active  Participation Quality:  Attentive and Sharing  Affect:  Appropriate  Cognitive:  Oriented  Insight:  Limited  Engagement in Group:  Good  Engagement in Therapy:  Good  Modes of Intervention:  Clarification, Education, Problem-solving and Support  Summary of Progress/Problems: Patient came into group and talked about being calmer, less anxious and feeling like her medications were working. She was glad she wasn't talking as much. She talked about wanting to find peace and not worrying so much about others. She talked about a way of coping and getting more focused would be to laugh out loud, however in school this caused her to get in a lot of trouble because the teacher stated she was disrupting the class. She also had run into trouble with this in groups on the unit. Encouraged her to look at a different way of getting focused, but she had difficulty with this feedback. Patient had been logical in conversation until she started talking about things coming out her ears and back in again. Patient's conversation continued to regress.   [name] 02/19/2012, 9:57 AM
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
## 2012-02-19 NOTE — Progress Notes (Signed)
[facility] MD Progress Note  02/19/2012 9:49 AM  Diagnosis:  Axis I: Major Depressive Disorder - Recurrent.  Obsessive Compulsive Disorder.  Generalized Anxiety Disorder.  Axis II:  Schizotypal Personality Disorder.  The patient was seen today and reports the following:   ADL's: Intact.  Sleep: The patient reports to having significant difficulty sleeping last night stating "my shadow scared me." Appetite: The patient reports a good appetite today.   Mild>(1-10) >Severe  Hopelessness (1-10): 0  Depression (1-10): 0  Anxiety (1-10): 0   Suicidal Ideation: The patient adamantly denies any suicidal ideations today.  Plan: No  Intent: No  Means: No   Homicidal Ideation: The patient adamantly denies any homicidal ideations today.  Plan: No  Intent: No.  Means: No   General Appearance/Behavior: The patient remained friendly and cooperative with this provider today but was unusual thought processes.   Eye Contact: Good.  Speech: Appropriate in rate and volume with no pressuring noted today.  Motor Behavior: wnl.  Level of Consciousness: Alert and Oriented x 3.  Mental Status: Alert and Oriented x 3.  Mood: Mildly depressed.  Affect: Mildly constricted.  Anxiety Level: No anxiety reported at time of interview today.  Thought Process: The patient continues to display significant unusual thought processes today stating her shadow looks differently today and frightened her. Thought Content: The patient denies any current auditory or visual hallucinations today. The patient displays some paranoid thoughts today. Perception:. The patient states that she was frightened last night by her shadow. Judgment: Fair.  Insight: Fair.  Cognition: Oriented to person, place and time.  Sleep:  Number of Hours: 3.75    Vital Signs:Blood pressure 115/83, pulse 106, temperature 98.2 F (36.8 C), temperature source Oral, resp. rate 20, height 5\' 11"  (1.803 m), weight 68.493 kg (151 lb), last menstrual  period 02/06/2012.  Current Medications: Current Facility-Administered Medications  Medication Dose Route Frequency Provider Last Rate Last Dose  . acetaminophen (TYLENOL) tablet 650 mg  650 mg Oral Q6H PRN Verne Spurr, PA-C      . alum & mag hydroxide-simeth (MAALOX/MYLANTA) 200-200-20 MG/5ML suspension 30 mL  30 mL Oral Q4H PRN Verne Spurr, PA-C      . ARIPiprazole (ABILIFY) tablet 10 mg  10 mg Oral Daily Curlene Labrum Darnelle Derrick, MD   10 mg at 02/19/12 0821  . divalproex (DEPAKOTE ER) 24 hr tablet 500 mg  500 mg Oral QHS Lonnetta Kniskern D Heidi Maclin, MD      . haloperidol (HALDOL) tablet 2 mg  2 mg Oral Q6H PRN Ronny Bacon, MD   2 mg at 02/19/12 0537  . magnesium hydroxide (MILK OF MAGNESIA) suspension 30 mL  30 mL Oral Daily PRN Verne Spurr, PA-C      . sertraline (ZOLOFT) tablet 200 mg  200 mg Oral Daily Curlene Labrum Wanna Gully, MD   200 mg at 02/19/12 0821  . traZODone (DESYREL) tablet 50 mg  50 mg Oral QHS,MR X 1 Curlene Labrum Sharnell Knight, MD   50 mg at 02/18/12 2128  . DISCONTD: ARIPiprazole (ABILIFY) tablet 5 mg  5 mg Oral Daily Curlene Labrum Mela Perham, MD   5 mg at 02/18/12 0757  . DISCONTD: potassium chloride SA (K-DUR,KLOR-CON) CR tablet 20 mEq  20 mEq Oral BID Ronny Bacon, MD   20 mEq at 02/19/12 4098   Lab Results:  Results for orders placed during the hospital encounter of 02/16/12 (from the past 48 hour(s))  COMPREHENSIVE METABOLIC PANEL     Status: Abnormal  Collection Time   02/18/12  7:30 PM      Component Value Range Comment   Sodium 140  135 - 145 (mEq/L)    Potassium 4.2  3.5 - 5.1 (mEq/L)    Chloride 104  96 - 112 (mEq/L)    CO2 25  19 - 32 (mEq/L)    Glucose, Bld 81  70 - 99 (mg/dL)    BUN 17  6 - 23 (mg/dL)    Creatinine, Ser 1.61  0.50 - 1.10 (mg/dL)    Calcium 9.6  8.4 - 10.5 (mg/dL)    Total Protein 7.3  6.0 - 8.3 (g/dL)    Albumin 4.3  3.5 - 5.2 (g/dL)    AST 17  0 - 37 (U/L)    ALT 17  0 - 35 (U/L)    Alkaline Phosphatase 71  39 - 117 (U/L)    Total Bilirubin 0.2 (*) 0.3 - 1.2  (mg/dL)    GFR calc non Af Amer >90  >90 (mL/min)    GFR calc Af Amer >90  >90 (mL/min)   TSH     Status: Normal   Collection Time   02/18/12  7:30 PM      Component Value Range Comment   TSH 0.626  0.350 - 4.500 (uIU/mL)   T3, FREE     Status: Normal   Collection Time   02/18/12  7:30 PM      Component Value Range Comment   T3, Free 3.1  2.3 - 4.2 (pg/mL)   T4, FREE     Status: Normal   Collection Time   02/18/12  7:30 PM      Component Value Range Comment   Free T4 1.06  0.80 - 1.80 (ng/dL)    Time was spent today discussing with the patient her current symptoms.  The patient reports to having difficulty sleeping last night due to being frightened by her shadow.  The patient also accessorized her outfit last night with toilet tissue and reports to "dancing" in order to "burn off energy."   She denies any suicidal or homicidal ideations today.  She also denied any auditory or visual hallucinations today.  Since the patient could be displaying some evidence of a schizoaffective disorder - bipolar type, Depakote ER will be added for further mood stabilization.  Treatment Plan Summary:  1. Daily contact with patient to assess and evaluate symptoms and progress in treatment  2. Medication management  3. The patient will deny suicidal ideations or homicidal ideations for 48 hours prior to discharge and have a depression and anxiety rating of 3 or less. The patient will also deny any auditory or visual hallucinations or delusional thinking.  4. The patient will deny any symptoms of substance withdrawal at time of discharge.   Plan:  1. Will continue the patient on her current medications. 2. Will start the medication Depakote ER 500 mgs po qhs for further mood stabilization.  3. Laboratory studies reviewed. 4. Will discontinue any further supplementation of potassium since the patient's serum potassium level today is 4.2. 5. Will continue to monitor.   Sencere Symonette 02/19/2012, 9:49 AM

## 2012-02-19 NOTE — Discharge Planning (Signed)
Met with patient in Aftercare Planning Group.  She laughed constantly throughout group, and got up to distribute torn pieces of paper to everyone in the room.  She asked inappropriate questions of other patients, intruding into their personal business, was difficult to redirect.  Ambrose Mantle, LCSW 02/19/2012, 9:56 AM

## 2012-02-19 NOTE — Progress Notes (Signed)
Pt. Exhibiting erratic, labile behavior. Pt. Used toilet tissue accentuate her outfit, put tissue in her shoes, around her waist, her bra on the outside of her clothing and made her a hat out of toilet tissue wrap. Pt. Says it's "wacky tacky day". Writer informed pt. Today is not wacky tacky day and to have her bra on her top clothes was inappropriate. Pt. Agreed to take bra off, but keep on paper tissue hat. Writer gave meds accordingly see MAR. Staff will monitor q66min for safety.

## 2012-02-19 NOTE — Progress Notes (Addendum)
Nurse introduced herself to patient who shook hands with nurse.   Looking forward to going outside for recreation.   Has been laughing/talking to peers waiting in line to go to recreation.   Patient has been appropriate and cheerful with this nurse. 1830  Patient's mother, Hartley Barefoot cell phone [phone], came to see her daughter.  Daughter stated nurse could talk to her mother.  We were all together in patient's room.  Mother would like to talk to MD/case manager about patient's status.  How long will patient be here at [facility], what are the staff's thoughts about patient's status.  Mother believes her daughter's condition has not improved.  Mother stated she took her daughter to [facility] ER 2 weeks ago and discharged her.  Monday night took daughter to [facility] ER, and then to [facility] on Tuesday.   Patient told her mom that today is "tacky day".   Then patient and mother said they loved each other, hugged.   Patient stated she finally feels safe here at [facility] and safe with her mom, feels safe in her own mind.  Thanked her mother for bringing her to [facility].   Patient has drawn picture on her wall with crayons.  Crayons were taken out of patient's room.

## 2012-02-20 DIAGNOSIS — F250 Schizoaffective disorder, bipolar type: Secondary | ICD-10-CM | POA: Diagnosis present

## 2012-02-20 MED ORDER — TRAZODONE HCL 100 MG PO TABS
100.0000 mg | ORAL_TABLET | Freq: Every evening | ORAL | Status: DC | PRN
  Administered 2012-02-20 – 2012-02-21 (×2): 100 mg via ORAL
  Filled 2012-02-20 (×8): qty 1

## 2012-02-20 MED ORDER — DIVALPROEX SODIUM ER 500 MG PO TB24
1000.0000 mg | ORAL_TABLET | Freq: Every day | ORAL | Status: DC
  Administered 2012-02-20 – 2012-02-29 (×10): 1000 mg via ORAL
  Filled 2012-02-20 (×3): qty 2
  Filled 2012-02-20 (×3): qty 14
  Filled 2012-02-20 (×6): qty 2
  Filled 2012-02-20: qty 14

## 2012-02-20 NOTE — Progress Notes (Signed)
[facility] Group Notes:  (Counselor/Nursing/MHT/Case Management/Adjunct)  02/20/2012 2:44 PM  Type of Therapy:  Group Therapy  Participation Level:  Active  Participation Quality:  Inattentive, Redirectable and Resistant  Affect:  Irritable  Cognitive:  Oriented  Insight:  None  Engagement in Group:  Good  Engagement in Therapy:  Good  Modes of Intervention:  Education, Orientation, Support and Exploration  Summary of Progress/Problems: Patient exhibited irritability when counselor confronted her about her spurts of laughing. She stated "can't I have freedom of speech". She stated that her laughing had nothing to do with her peer talking however she did not see anything wrong with her behavior. She was continually talking while others were talking. Conversation was not logical. Moving from place to place in the room.   HartisAram Beecham 02/20/2012, 2:44 PM

## 2012-02-20 NOTE — Tx Team (Signed)
Interdisciplinary Treatment Plan Update (Adult)  Date:  02/20/2012  Time Reviewed:  10:15AM-11:00AM  Progress in Treatment: Attending groups:  Yes Participating in groups:    Not appropriately Taking medication as prescribed:    Yes Tolerating medication:   Yes Family/Significant other contact made:  Yes Patient understands diagnosis:   No Discussing patient identified problems/goals with staff:   Yes Medical problems stabilized or resolved:   Yes Denies suicidal/homicidal ideation:  Yes Issues/concerns per patient self-inventory:   None Other:    New problem(s) identified: Yes, Describe:  A/V hallucinations and lack of sleep  Reason for Continuation of Hospitalization: Anxiety Delusions  Hallucinations Mania Medication stabilization  Interventions implemented related to continuation of hospitalization:  Medication monitoring and adjustment, safety checks Q15 min., suicide risk assessment, group therapy, psychoeducation, collateral contact, aftercare planning, ongoing physician assessments, medication education  Additional comments:  Not applicable  Estimated length of stay:  4-5 days  Discharge Plan:  Unknown at this time where she will live although can stay with mother or aunt.  Follow-up at [facility].  New goal(s):  #6 Return to normal sleep pattern of 6+ hrs per night. - Did not sleep last night, states she was up dancing all night. #7 Deny Auditory and Visual hallucinations and delusions. - States she is seeing things that make her laugh (laughs constantly and inappropriately).  States her feet were climbing the wall last night without her.  Review of initial/current patient goals per problem list:   1.  Goal(s):  Reduce paranoia to baseline per patient and family.  Met:  No  Target date:  By Discharge   As evidenced by:  Remains paranoid  2.  Goal(s):  Reduce anxiety from admission level to no greater than 3 at discharge.  Met:  No  Target date:  By Discharge     As evidenced by:  Reports none, but appears moderately anxious often  3.  Goal(s):  Determine aftercare placement.  Met:  No  Target date:  By Discharge   As evidenced by:  Patient not stable to make decision, seems not to be considering this  4.  Goal(s):  Medication stabilization so OCD symptoms under control.  Met:  No  Target date:  By Discharge   As evidenced by:  Medications still being adjusted  5. Goal(s): Reduce racing thoughts to baseline.  Met: No  Target date: By Discharge  As evidenced by: Still racing   Attendees: Patient:  Cheryl Acevedo  02/20/2012 10:15AM-11:00AM  Family:     Physician:  Dr. Harvie Heck Readling 02/20/2012 10:15AM-11:00AM  Nursing:   Tacy Learn, RN 02/20/2012 10:15AM -11:00AM   Case Manager:  Ambrose Mantle, LCSW 02/20/2012 10:15AM-11:00AM  Counselor:  Veto Kemps, MT-BC 02/20/2012 10:15AM-11:00AM  Other:   Lynann Bologna, NP 02/20/2012 10:15AM-11:00AM  Other:      Other:      Other:       Scribe for Treatment Team:   Sarina Ser, 02/20/2012, 10:15AM-11:15AM

## 2012-02-20 NOTE — Progress Notes (Signed)
Patient ID: Cheryl Acevedo, female   DOB: 08/19/1991, 20 y.o.   MRN: [id number] Pt. denies lethality and A/V/H's, but Pt.'s speech is pressured and she shows tangential thinking as well as some loose associations at times.  Pt.'s physical behavior is often seen to be childlike at times with giggling and "dancing" and demonstrates poor boundaries, by approaching into staff or peers' personal spaces.  Pt. is not overtly sexual in her interactions with others, but sometimes makes vague statements with sexual innuendo.   Pt. was asked about the crayon drawing she applied to the wall in her room in front of her visiting mother and Pt. began to ramble on about the "working of my mind and this is my reality".  Pt. was also seen laughing at or toward her mother when her mother became upset about Pt.'s discarding some clothing and personal items she had newly bought for Pt.: Pt. also took a toothbrush her mother had given her and this Clinical research associate saw her drop in the linen basket.  Staff MHT's went down to the laundry/linen room in the lower level of the hospital to try to find some Pt.'s things: 2 shirts were found and when mother was asked if she recognized them, she said she did: mother was tearful by this time, after being irritable and accusing staff of stealing Pt's things.  Mother was calmer and stopped accusing staff and just expressed her concern about Pt.'s receiving her medication, which Pt. had stopped taking, prior to admission.  After being reassured that all of Pt.'s records had been received from [facility] and that Pt. was on the same medications she had been prescribed before, Pt.'s mother was calm and was able to smile. Pt. took her HS meds and went to bed after observing peers take theirs.

## 2012-02-20 NOTE — Progress Notes (Signed)
Pt has been up and has been active in the milieu today, pt reports having difficulty sleeping and having racing thoughts, has had bizarre behaviors today, talked about not being able to trust anyone, has received all her medications today without incident, support provided, will continue to monitor

## 2012-02-20 NOTE — Discharge Planning (Signed)
Met with patient in Aftercare Planning Group.   She was late to group, sat down and was very somber for a few minutes then broke out in raucous laughter.  Patient stated she sees "stuff and that makes me laugh".  She laughed during the rest of group, even when serious topics were being discussed such as suicide.  Later during Treatment Team there was loud screaming and wailing coming from the hall, and when Case Manager investigated, this was patient who was upset to the extent that Doctor ordered that her PRN Haldol be administered immediately.  Patient then was walking in the hall crying, and grabbed another patient, female, to hug despite staff trying to remind her of hospital guidelines.  Patient remained depressed in appearance at least until lunchtime.  Utilization review done for additional days.  Ambrose Mantle, LCSW 02/20/2012, 2:13 PM

## 2012-02-20 NOTE — Progress Notes (Signed)
[facility] MD Progress Note  02/20/2012 2:51 PM  Diagnosis:  Axis I:  Schizoaffective Disorder - Bipolar Type. Major Depressive Disorder - Recurrent.  Obsessive Compulsive Disorder.  Generalized Anxiety Disorder.  Axis II: Schizotypal Personality Disorder.   The patient was seen today and reports the following:   ADL's: Intact.  Sleep: The patient reports to having ongoing significant difficulty sleeping last night.  She states she was having racing thoughts and felt "my feet were climbing the wall." Appetite: The patient reports a good appetite today.   Mild>(1-10) >Severe  Hopelessness (1-10): 0  Depression (1-10): 0  Anxiety (1-10): 0   Suicidal Ideation: The patient adamantly denies any suicidal ideations today.  Plan: No  Intent: No  Means: No   Homicidal Ideation: The patient adamantly denies any homicidal ideations today.  Plan: No  Intent: No.  Means: No   General Appearance/Behavior: The patient remained friendly and cooperative with this provider today but with ongoing unusual thoughts today.  Eye Contact: Good.  Speech: Appropriate in rate and volume with no pressuring noted today.  Motor Behavior: wnl.  Level of Consciousness: Alert and Oriented x 3.  Mental Status: Alert and Oriented x 3.  Mood: Mildly depressed.  Affect: Mildly constricted.  Anxiety Level: No anxiety reported at time of interview today.  Thought Process: The patient continues to display significant unusual thought processes today stating that she "drank something out of a cup which made me hallucinate."  Thought Content: The patient denies any current auditory hallucinations but reports visual disturbances today related to "seeing colors" which are not there and feeling that liquids frighten her. The patient also displays some ongoing paranoid thoughts today.  Perception:. The patient reports some thought disturbances as described above.  Judgment: Fair to Poor.  Insight: Fair to Poor.  Cognition:  Oriented to person, place and time.  Sleep:  Number of Hours: 2.25    Vital Signs:Blood pressure 119/86, pulse 110, temperature 97.8 F (36.6 C), temperature source Oral, resp. rate 18, height 5\' 11"  (1.803 m), weight 68.493 kg (151 lb), last menstrual period 02/06/2012.  Current Medications: Current Facility-Administered Medications  Medication Dose Route Frequency Provider Last Rate Last Dose  . acetaminophen (TYLENOL) tablet 650 mg  650 mg Oral Q6H PRN Verne Spurr, PA-C      . alum & mag hydroxide-simeth (MAALOX/MYLANTA) 200-200-20 MG/5ML suspension 30 mL  30 mL Oral Q4H PRN Verne Spurr, PA-C      . ARIPiprazole (ABILIFY) tablet 10 mg  10 mg Oral Daily Curlene Labrum Johnwesley Lederman, MD   10 mg at 02/20/12 0805  . divalproex (DEPAKOTE ER) 24 hr tablet 1,000 mg  1,000 mg Oral QHS Curlene Labrum Johncharles Fusselman, MD      . haloperidol (HALDOL) tablet 2 mg  2 mg Oral Q6H PRN Curlene Labrum Xyla Leisner, MD   2 mg at 02/20/12 1033  . magnesium hydroxide (MILK OF MAGNESIA) suspension 30 mL  30 mL Oral Daily PRN Verne Spurr, PA-C      . sertraline (ZOLOFT) tablet 200 mg  200 mg Oral Daily Curlene Labrum Berdell Nevitt, MD   200 mg at 02/20/12 0805  . traZODone (DESYREL) tablet 50 mg  50 mg Oral QHS,MR X 1 Curlene Labrum Kymani Laursen, MD   50 mg at 02/19/12 2121  . DISCONTD: divalproex (DEPAKOTE ER) 24 hr tablet 500 mg  500 mg Oral QHS Curlene Labrum Amber Williard, MD   500 mg at 02/19/12 2121   Lab Results:  Results for orders placed during the hospital  encounter of 02/16/12 (from the past 48 hour(s))  COMPREHENSIVE METABOLIC PANEL     Status: Abnormal   Collection Time   02/18/12  7:30 PM      Component Value Range Comment   Sodium 140  135 - 145 (mEq/L)    Potassium 4.2  3.5 - 5.1 (mEq/L)    Chloride 104  96 - 112 (mEq/L)    CO2 25  19 - 32 (mEq/L)    Glucose, Bld 81  70 - 99 (mg/dL)    BUN 17  6 - 23 (mg/dL)    Creatinine, Ser 1.61  0.50 - 1.10 (mg/dL)    Calcium 9.6  8.4 - 10.5 (mg/dL)    Total Protein 7.3  6.0 - 8.3 (g/dL)    Albumin 4.3  3.5 - 5.2  (g/dL)    AST 17  0 - 37 (U/L)    ALT 17  0 - 35 (U/L)    Alkaline Phosphatase 71  39 - 117 (U/L)    Total Bilirubin 0.2 (*) 0.3 - 1.2 (mg/dL)    GFR calc non Af Amer >90  >90 (mL/min)    GFR calc Af Amer >90  >90 (mL/min)   TSH     Status: Normal   Collection Time   02/18/12  7:30 PM      Component Value Range Comment   TSH 0.626  0.350 - 4.500 (uIU/mL)   T3, FREE     Status: Normal   Collection Time   02/18/12  7:30 PM      Component Value Range Comment   T3, Free 3.1  2.3 - 4.2 (pg/mL)   T4, FREE     Status: Normal   Collection Time   02/18/12  7:30 PM      Component Value Range Comment   Free T4 1.06  0.80 - 1.80 (ng/dL)    Time was spent today discussing with the patient her current symptoms. The patient reports to having ongoing difficulty initiating and maintaining sleep stating that she wanted to "dance" and "my feet wanted to climb the wall."  She also states that she drank something this morning out of a cup in the dayroom and began to hallucinate.  The patient reports that she has no knowledge of what it was she drank.  She continues to be somewhat labile in her mood and continues to display an unusual thought process.  Treatment Plan Summary:  1. Daily contact with patient to assess and evaluate symptoms and progress in treatment  2. Medication management  3. The patient will deny suicidal ideations or homicidal ideations for 48 hours prior to discharge and have a depression and anxiety rating of 3 or less. The patient will also deny any auditory or visual hallucinations or delusional thinking.  4. The patient will deny any symptoms of substance withdrawal at time of discharge.   Plan:  1. Will continue the patient on her current medications.  2. Will increase the medication Depakote ER to 1000 mgs po qhs for further mood stabilization.  3. Will increase the medication Trazodone to 100 mgs po qhs for sleep. 4. Laboratory studies reviewed.  5. Will order a repeat serum  Depakote Level for February 22, 2012. 6. Will continue to monitor.   Chrisangel Eskenazi 02/20/2012, 2:51 PM

## 2012-02-20 NOTE — Progress Notes (Signed)
Pt is flirtatious and overly friendly  She was wearing tags on her jeans when she went to karoke group tonight  She takes her medications without a problem  She had to be brought back early from group due to inappropriate boundaries and behavior   Verbal support given  Medications administered and effectiveness monitored  Q 15 min checks   Pt safe at present

## 2012-02-21 NOTE — Progress Notes (Signed)
[facility] Group Notes:  (Counselor/Nursing/MHT/Case Management/Adjunct)  02/21/2012 11 AM  Type of Therapy:  Group Therapy, Dance/Movement Therapy   Participation Level:  Did Not Attend  Pt. Did not attend aftercare planning group.    Lanai Conlee  

## 2012-02-21 NOTE — Progress Notes (Signed)
Patient ID: Cheryl Acevedo, female   DOB: [birth year], 20 y.o.   MRN: [id number] The patient has disorganized thoughts and is behaving in a labile manner. She has some delusional thoughts regarding a female patient and needs frequent reminders regarding boundaries. Went into her room and wet her hair as she was laughing inappropriately. Was very disruptive during group and had to be asked to leave. Needs constant redirection.

## 2012-02-21 NOTE — Progress Notes (Signed)
Patient ID: Cheryl Acevedo, female   DOB: May 15, 1991, 20 y.o.   MRN: [id number] [facility] MD Progress Note  02/21/2012 9:45 PM  Diagnosis:  Axis I:  Schizoaffective Disorder - Bipolar Type. Major Depressive Disorder - Recurrent.  Obsessive Compulsive Disorder.  Generalized Anxiety Disorder.  Axis II: Schizotypal Personality Disorder.   The patient was seen today and reports the following:   ADL's: Intact.  Sleep: good per pt. Appetite: The patient reports a good appetite today.   Mild>(1-10) >Severe  Hopelessness (1-10): 0  Depression (1-10): 0  Anxiety (1-10): 0   Suicidal Ideation: The patient adamantly denies any suicidal ideations today.  Plan: No  Intent: No  Means: No   Homicidal Ideation: The patient adamantly denies any homicidal ideations today.  Plan: No  Intent: No.  Means: No   General Appearance/Behavior: The patient remained friendly and cooperative with this provider today but with ongoing unusual thoughts today.  Eye Contact: Good.  Speech: Appropriate in rate and volume with no pressuring noted today.  Motor Behavior: wnl.  Level of Consciousness: Alert and Oriented x 3.  Mental Status: Alert and Oriented x 3.  Mood: Mildly depressed.  Affect: Mildly constricted.  Anxiety Level: No anxiety reported at time of interview today.  Thought Process: disorganized, circumstantial Thought Content: The patient denies any current auditory hallucinations  Perception:. The patient reports some thought disturbances as described above.  Judgment: Fair to Poor.  Insight: Fair to Poor.  Cognition: Oriented to person, place and time.  Sleep:  Number of Hours: 6.75    Vital Signs:Blood pressure 126/87, pulse 71, temperature 97.2 F (36.2 C), temperature source Oral, resp. rate 16, height 5\' 11"  (1.803 m), weight 68.493 kg (151 lb), last menstrual period 02/06/2012.  Current Medications: Current Facility-Administered Medications  Medication Dose Route Frequency Provider  Last Rate Last Dose  . acetaminophen (TYLENOL) tablet 650 mg  650 mg Oral Q6H PRN Verne Spurr, PA-C      . alum & mag hydroxide-simeth (MAALOX/MYLANTA) 200-200-20 MG/5ML suspension 30 mL  30 mL Oral Q4H PRN Verne Spurr, PA-C      . ARIPiprazole (ABILIFY) tablet 10 mg  10 mg Oral Daily Curlene Labrum Readling, MD   10 mg at 02/21/12 4098  . divalproex (DEPAKOTE ER) 24 hr tablet 1,000 mg  1,000 mg Oral QHS Curlene Labrum Readling, MD   1,000 mg at 02/20/12 2132  . haloperidol (HALDOL) tablet 2 mg  2 mg Oral Q6H PRN Ronny Bacon, MD   2 mg at 02/21/12 1054  . magnesium hydroxide (MILK OF MAGNESIA) suspension 30 mL  30 mL Oral Daily PRN Verne Spurr, PA-C      . sertraline (ZOLOFT) tablet 200 mg  200 mg Oral Daily Curlene Labrum Readling, MD   200 mg at 02/21/12 0808  . traZODone (DESYREL) tablet 100 mg  100 mg Oral QHS,MR X 1 Curlene Labrum Readling, MD   100 mg at 02/20/12 2132   Lab Results:  No results found for this or any previous visit (from the past 48 hour(s)).  Time was spent today discussing with the patient her current symptoms. The patient reports missing her ex boy friend. Focussed on him most of the conversation. Trying to tell about the reason for the break up but could not convey on repeated attempts. In good mood at times but was angry at times. Able to sleep and fair hieygene,  Plan:   1. Will continue the patient on her current medications.  Wonda Cerise 02/21/2012, 9:45 PM

## 2012-02-21 NOTE — Progress Notes (Signed)
Pt is intrusive and flirtatious with female pts  Her mood is labile and thoughts disorganized   She said she wanted a pill that would make her feel cause she wasn't feeling   She continues to have ritualistic behavior   Messing up her room throwing paper and water on the floor  She is having frequent mood swings laughing and friendly one minute and tearful the next  Verbal support given  Medications administered and effectiveness monitored   Q 15 min checks  Pt safe at present

## 2012-02-22 MED ORDER — BENZTROPINE MESYLATE 1 MG/ML IJ SOLN
INTRAMUSCULAR | Status: AC
  Administered 2012-02-22: 2 mg via INTRAMUSCULAR
  Filled 2012-02-22: qty 2

## 2012-02-22 MED ORDER — TRAZODONE HCL 50 MG PO TABS
150.0000 mg | ORAL_TABLET | Freq: Every evening | ORAL | Status: DC | PRN
  Administered 2012-02-22 – 2012-02-26 (×5): 150 mg via ORAL
  Filled 2012-02-22 (×4): qty 3
  Filled 2012-02-22: qty 42
  Filled 2012-02-22 (×5): qty 3
  Filled 2012-02-22 (×2): qty 42
  Filled 2012-02-22 (×2): qty 3
  Filled 2012-02-22: qty 42
  Filled 2012-02-22: qty 3

## 2012-02-22 MED ORDER — BENZTROPINE MESYLATE 1 MG/ML IJ SOLN
2.0000 mg | Freq: Once | INTRAMUSCULAR | Status: AC
  Administered 2012-02-22: 2 mg via INTRAMUSCULAR

## 2012-02-22 MED ORDER — HYDROXYZINE HCL 50 MG PO TABS
50.0000 mg | ORAL_TABLET | Freq: Three times a day (TID) | ORAL | Status: DC | PRN
  Filled 2012-02-22: qty 1

## 2012-02-22 MED ORDER — OLANZAPINE 5 MG PO TBDP
5.0000 mg | ORAL_TABLET | Freq: Once | ORAL | Status: DC
  Filled 2012-02-22: qty 1

## 2012-02-22 MED ORDER — BENZTROPINE MESYLATE 1 MG/ML IJ SOLN: 2.0000 mg | Freq: Four times a day (QID) | INTRAMUSCULAR | Status: DC | PRN

## 2012-02-22 MED ORDER — OLANZAPINE 5 MG PO TBDP: 5.0000 mg | ORAL_TABLET | Freq: Four times a day (QID) | ORAL | Status: DC | PRN

## 2012-02-22 MED ORDER — HALOPERIDOL 5 MG PO TABS
5.0000 mg | ORAL_TABLET | Freq: Four times a day (QID) | ORAL | Status: DC | PRN
  Filled 2012-02-22: qty 1

## 2012-02-22 MED ORDER — BENZTROPINE MESYLATE 1 MG PO TABS: 2.0000 mg | ORAL_TABLET | Freq: Four times a day (QID) | ORAL | Status: DC | PRN

## 2012-02-22 MED ORDER — ARIPIPRAZOLE 2 MG PO TABS
12.0000 mg | ORAL_TABLET | Freq: Every day | ORAL | Status: DC
  Administered 2012-02-23: 12 mg via ORAL
  Filled 2012-02-22 (×3): qty 6
  Filled 2012-02-22: qty 1

## 2012-02-22 MED ORDER — HALOPERIDOL LACTATE 5 MG/ML IJ SOLN
5.0000 mg | Freq: Once | INTRAMUSCULAR | Status: AC
  Administered 2012-02-22: 5 mg via INTRAMUSCULAR

## 2012-02-22 MED ORDER — OLANZAPINE 5 MG PO TBDP
ORAL_TABLET | ORAL | Status: AC
  Filled 2012-02-22: qty 1

## 2012-02-22 MED ORDER — HALOPERIDOL LACTATE 5 MG/ML IJ SOLN
INTRAMUSCULAR | Status: AC
  Administered 2012-02-22: 5 mg via INTRAMUSCULAR
  Filled 2012-02-22: qty 1

## 2012-02-22 NOTE — Progress Notes (Signed)
[facility] Group Notes:  (Counselor/Nursing/MHT/Case Management/Adjunct)  02/22/2012 2:01 PM  Type of Therapy:  Group Therapy  Participation Level:  Active  Participation Quality:  Appropriate  Affect:  Anxious and Appropriate  Cognitive:  Appropriate  Insight:  Good  Engagement in Group:  Good  Engagement in Therapy:  Good  Modes of Intervention:  Clarification, Limit-setting, Socialization and Support  Summary of Progress/Problems: Pt. Participated in group discussion on supports and who their supports are in their lives. Pt. spoke about opening up and trusting people in order for them to be a support. Pt. seemed distrustful and kept getting and walking in and out of group.   Lamar Blinks [place] 02/22/2012, 2:01 PM

## 2012-02-22 NOTE — Progress Notes (Signed)
Pt has been calmer and more easily redirected since receiving the administered medications this morning around 7am  She continues to perform ritualistic behaviors and tries to wet everything  She is attention seeking and walked  in and out of spirituality group this morning and did not participate in the discussion afterward  Md and nurse spoke with pt mother about her medications this morning and made some changes regarding medications    Verbal support given  Medications administered and effectiveness monitored  Q 15 min checks  Pt safe at present

## 2012-02-22 NOTE — Progress Notes (Addendum)
Patient ID: Cheryl Acevedo, female   DOB: 10/14/1991, 21 y.o.   MRN: [id number] [facility] MD Progress Note  02/22/2012 8:14 PM  Diagnosis:  Axis I:  Schizoaffective Disorder - Bipolar Type. Major Depressive Disorder - Recurrent.  Obsessive Compulsive Disorder.  Generalized Anxiety Disorder.  Axis II: Schizotypal Personality Disorder.   The patient was seen today and reports the following:   ADL's: Intact.  Sleep: good per pt. Appetite: The patient reports a good appetite today.   Mild>(1-10) >Severe  Hopelessness (1-10): 0  Depression (1-10): 0  Anxiety (1-10): 0   Suicidal Ideation: The patient adamantly denies any suicidal ideations today.  Plan: No  Intent: No  Means: No   Homicidal Ideation: The patient adamantly denies any homicidal ideations today.  Plan: No  Intent: No.  Means: No   General Appearance/Behavior: The patient remained friendly and cooperative with this provider today  Eye Contact: Good.  Speech: Appropriate in rate and volume with no pressuring noted today.  Motor Behavior: wnl.  Level of Consciousness: Alert and Oriented x 3.  Mental Status: Alert and Oriented x 3.  Mood: fine Affect: labile Anxiety Level: No anxiety reported at time of interview today.  Thought Process: disorganized, circumstantial Thought Content: The patient denies any current auditory hallucinations  Perception:. The patient reports some thought disturbances as described above.  Judgment: Fair to Poor.  Insight: Fair to Poor.  Cognition: Oriented to person, place and time.  Sleep:  Number of Hours: 4.25    Vital Signs:Blood pressure 117/84, pulse 88, temperature 97.2 F (36.2 C), temperature source Oral, resp. rate 18, height 5\' 11"  (1.803 m), weight 68.493 kg (151 lb), last menstrual period 02/06/2012.  Current Medications: Current Facility-Administered Medications  Medication Dose Route Frequency Provider Last Rate Last Dose  . acetaminophen (TYLENOL) tablet 650 mg  650 mg  Oral Q6H PRN Verne Spurr, PA-C      . alum & mag hydroxide-simeth (MAALOX/MYLANTA) 200-200-20 MG/5ML suspension 30 mL  30 mL Oral Q4H PRN Verne Spurr, PA-C      . ARIPiprazole (ABILIFY) tablet 12 mg  12 mg Oral Daily Wonda Cerise, MD      . benztropine (COGENTIN) tablet 2 mg  2 mg Oral Q6H PRN Wonda Cerise, MD       Or  . benztropine mesylate (COGENTIN) injection 2 mg  2 mg Intramuscular Q6H PRN Wonda Cerise, MD      . benztropine mesylate (COGENTIN) injection 2 mg  2 mg Intramuscular Once Wonda Cerise, MD   2 mg at 02/22/12 0715  . divalproex (DEPAKOTE ER) 24 hr tablet 1,000 mg  1,000 mg Oral QHS Curlene Labrum Readling, MD   1,000 mg at 02/21/12 2146  . haloperidol (HALDOL) tablet 5 mg  5 mg Oral Q6H PRN Wonda Cerise, MD      . haloperidol lactate (HALDOL) injection 5 mg  5 mg Intramuscular Once Wonda Cerise, MD   5 mg at 02/22/12 0715  . hydrOXYzine (ATARAX/VISTARIL) tablet 50 mg  50 mg Oral TID PRN Wonda Cerise, MD      . magnesium hydroxide (MILK OF MAGNESIA) suspension 30 mL  30 mL Oral Daily PRN Verne Spurr, PA-C      . sertraline (ZOLOFT) tablet 200 mg  200 mg Oral Daily Curlene Labrum Readling, MD   200 mg at 02/22/12 0802  . traZODone (DESYREL) tablet 150 mg  150 mg Oral QHS,MR X 1 Wonda Cerise, MD      . DISCONTD: ARIPiprazole (ABILIFY) tablet  10 mg  10 mg Oral Daily Curlene Labrum Readling, MD   10 mg at 02/22/12 0802  . DISCONTD: haloperidol (HALDOL) tablet 2 mg  2 mg Oral Q6H PRN Ronny Bacon, MD   2 mg at 02/22/12 0450  . DISCONTD: OLANZapine zydis (ZYPREXA) disintegrating tablet 5 mg  5 mg Oral Once Wonda Cerise, MD      . DISCONTD: OLANZapine zydis (ZYPREXA) disintegrating tablet 5 mg  5 mg Oral Q6H PRN Wonda Cerise, MD      . DISCONTD: traZODone (DESYREL) tablet 100 mg  100 mg Oral QHS,MR X 1 Curlene Labrum Readling, MD   100 mg at 02/21/12 2146   Lab Results:  No results found for this or any previous visit (from the past 48 hour(s)).  Time was spent today discussing with the patient her current symptoms. She  was disruptive, naked at times, flooded her room with water, repated handwashing, obsessive about dirt on everything per nursing staff. She complained about the things that are dirty now and she needs to clean. she is focussed too much on her own hyeigene and washing hands again and again. The patient reports she is in good mood at this time and does not admit her behavior from last night other that cleaning and washing hands. She is ble to sleep and fair hieygene. Her mother was updated about her progress and her meds including meds changes today. She was encouraged to talk with her primary team tomorrow if needed. She agreed with the staff. Able to sleep but wake after few hours.  Plan:   1. Will increase abilify to12 mg QD 2. Will increase haldol prn to 5 mg for agiataion  3. Will increase trazdone to 15o mg for sleep 4. Vistaril for anxiety and sleep prn   Wonda Cerise 02/22/2012, 8:14 PM

## 2012-02-22 NOTE — Progress Notes (Signed)
Patient ID: Cheryl Acevedo, female   DOB: 08/25/1991, 20 y.o.   MRN: [id number] The patient is a little better this evening. Her thoughts are still disorganized but her obsessive/compulsive behaviors are minimal. She has an anxious mood and affect and is restless. Requested her HS medications early so that she could go to bed. Compliant with medications.

## 2012-02-22 NOTE — Progress Notes (Signed)
[facility] Group Notes:  (Counselor/Nursing/MHT/Case Management/Adjunct)  02/22/2012 1030  Type of Therapy:  Discharge Planning  Participation Level:  Active  Summary of Progress/Problems: Pt attended aftercare planning group. Pt stated that she is feeling good today. Pt states that she chose not to eat breakfast this morning. Pt was alert and talkative. Pt did not have any concerns regarding aftercare and D/C.   Kiara Mcdowell 02/22/2012, 3:00 PM
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
## 2012-02-23 MED ORDER — ARIPIPRAZOLE 10 MG PO TABS
10.0000 mg | ORAL_TABLET | Freq: Every day | ORAL | Status: DC
  Administered 2012-02-24 – 2012-03-01 (×7): 10 mg via ORAL
  Filled 2012-02-23: qty 7
  Filled 2012-02-23 (×3): qty 1
  Filled 2012-02-23 (×3): qty 7
  Filled 2012-02-23 (×2): qty 1

## 2012-02-23 MED ORDER — HALOPERIDOL 2 MG PO TABS
2.0000 mg | ORAL_TABLET | Freq: Every day | ORAL | Status: DC
  Administered 2012-02-23 – 2012-02-26 (×4): 2 mg via ORAL
  Filled 2012-02-23 (×4): qty 1
  Filled 2012-02-23: qty 7
  Filled 2012-02-23: qty 1
  Filled 2012-02-23: qty 7

## 2012-02-23 NOTE — Discharge Planning (Signed)
Met with patient in Aftercare Planning Group.   She was quiet and somber throughout group, then left early.  She requested to speak to the Case Manager in an individual session, and indicated that it has to do with making a decision regarding where she will live at discharge.  On three occasions, Case Manager went to her room to talk with her.  One time she was not there, and was with another staff.  The other two times she was in the shower.  Case Manager asked the MHTs on the hall how many showers she has had so far today, and they report at least 4-5.  This was reported by CM to the doctor.  Ambrose Mantle, LCSW 02/23/2012, 4:51 PM

## 2012-02-23 NOTE — Progress Notes (Signed)
Pt has been up and has been active while in the milieu today, pt has been attending and participating in various milieu activities, pt has been mildly intrusive in regards to other patients and their care, pt did respond appropriately to re-direction, pt also mentioned about only being able to trust her mother and mentioned that she does feel safe while she is here, pt has received all medications without incident, support provided, will continue to monitor
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
## 2012-02-23 NOTE — Progress Notes (Signed)
[facility] Group Notes:  (Counselor/Nursing/MHT/Case Management/Adjunct)  02/23/2012 2:21 PM  Type of Therapy:  Group Therapy  Participation Level:  Active  Participation Quality:  Attentive and Sharing  Affect:  Anxious  Cognitive:  Confused  Insight:  Limited  Engagement in Group:  Good  Engagement in Therapy:  Good  Modes of Intervention:  Clarification, Education, Problem-solving and Support  Summary of Progress/Problems: Patient was a little calmer in group however she talked about being anxious about certain people. She assured group members it wasn't any of them but she was vague about who was scaring her. She kept repeating the statement "it's nobody's fault" as feedback and as a statement in general. Talked about the visits with mom. Continues to laugh and giggle inappropriately.   Reuben Knoblock, Aram Beecham 02/23/2012, 2:21 PM
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
## 2012-02-23 NOTE — Progress Notes (Signed)
Patient ID: Cheryl Acevedo, female   DOB: May 23, 1991, 20 y.o.   MRN: [id number] Pt. denies lethality and A/V/H's but admits to engaging in OCD behaviors like repetative face washing to the point of causing her face to appear to be sun-burnt.  Pt. ambulates about the 400 hallway and interacts appropriately with peers. 18:00--Pt.'s mother is here to visit. 21:45--Pt. came to the med. window to take her HS meds, then she went to bed.

## 2012-02-24 NOTE — Tx Team (Signed)
Interdisciplinary Treatment Plan Update (Adult)  Date:  02/24/2012  Time Reviewed:  10:15AM-11:00AM  Progress in Treatment: Attending groups:  Yes Participating in groups:    Yes Taking medication as prescribed:    Yes Tolerating medication:   Yes Family/Significant other contact made:  Yes Patient understands diagnosis:   Yes, limited insight, poor judgment Discussing patient identified problems/goals with staff:   Yes Medical problems stabilized or resolved:   Yes Denies suicidal/homicidal ideation:  Yes Issues/concerns per patient self-inventory:   None Other:    New problem(s) identified: No, Describe:    Reason for Continuation of Hospitalization: Anxiety Depression Hallucinations Medication stabilization Other; describe sleep, OCD symptoms, therapy  Interventions implemented related to continuation of hospitalization:  Medication monitoring and adjustment, safety checks Q15 min., suicide risk assessment, group therapy, psychoeducation, collateral contact, aftercare planning, ongoing physician assessments, medication education  Additional comments:  Not applicable  Estimated length of stay:  2-3 days  Discharge Plan:  Has decided to go live with mother, follow up is with Vesta Mixer  New goal(s):  Not applicable  Review of initial/current patient goals per problem list:   1.  Goal(s):  Reduce paranoia to baseline per patient and family.  Met:  No  Target date:  By Discharge   As evidenced by: During treatment team, patient continues to display paranoia, particularly about other patients and staff at this time  2.  Goal(s):  Reduce anxiety from admission level to no greater than 3 at discharge.  Met:  No  Target date:  By Discharge   As evidenced by:  Still moderate anxiety is apparent.  3.  Goal(s):  Determine aftercare placement.  Met:  Yes  Target date:  By Discharge   As evidenced by:  Will live with mother.  4.  Goal(s):  Medication stabilization so  OCD symptoms under control.  Met:  No  Target date:  By Discharge   As evidenced by:  Still working on the OCD symptoms.  Patient is showering many times daily, is obsessing over thoughts that someone may have moved her things in her room at various times.  5. Goal(s): Reduce racing thoughts to baseline.  Met: No Target date: By Discharge  As evidenced by: Is improving daily  6.  Goal(s): Return to normal sleep pattern of 6+ hrs per night.  Met: Yes  Target date:  By Discharge   As evidenced by:  Patient notes that her sleep is much better -- record states 6.75 hours last night.  7.  Goal(s):  Deny Auditory and Visual hallucinations and delusions.  Met:  No  Target date:  By Discharge   As evidenced by:  Is improving daily.   Attendees: Patient:  Cheryl Acevedo  02/24/2012 10:15AM-11:00AM  Family:  Hartley Barefoot, mother 02/24/2012 11:00AM  Physician:  Dr. Harvie Heck Readling 02/24/2012 10:15AM-11:00AM  Nursing:   Izola Price, RN 02/24/2012 10:15AM -11:00AM   Case Manager:  Ambrose Mantle, LCSW 02/24/2012 10:15AM-11:00AM  Counselor:  Veto Kemps, MT-BC 02/24/2012 10:15AM-11:00AM  Other:   Lynann Bologna, NP 02/24/2012 10:15AM-11:00AM  Other:      Other:      Other:       Scribe for Treatment Team:   Sarina Ser, 02/24/2012, 10:15AM-11:15AM
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
## 2012-02-24 NOTE — Discharge Planning (Signed)
Met with patient in Aftercare Planning Group.   She expressed no case management needs, is aware of upcoming meeting with mother.  When asked how she feels, she stated she is fine.  Case Manager noted to her that she is no longer giggling uncontrollably, but that she appears to have gone the opposite direction now, and appears depressed.  She replied, "no I'm fine, I'm just sleepy."  Ambrose Mantle, LCSW 02/24/2012, 9:12 AM

## 2012-02-24 NOTE — Progress Notes (Signed)
Lying in bed with eyes closed.  Exhibiting normal sleep behavior.  Safety checks conducted Q 15 minutes. 

## 2012-02-24 NOTE — Progress Notes (Signed)
[name] Progress Note  02/24/2012 2:18 PM  Diagnosis:  Axis I:  Schizoaffective Disorder - Bipolar Type. Major Depressive Disorder - Recurrent.  Obsessive Compulsive Disorder.  Generalized Anxiety Disorder.  Axis II: Schizotypal Personality Disorder.   The patient was seen today and reports the following:   ADL's: Intact.  Sleep: The patient reports to sleeping very well last night. Appetite: The patient reports a good appetite today.   Mild>(1-10) >Severe  Hopelessness (1-10): 0  Depression (1-10): 0  Anxiety (1-10): 0   Suicidal Ideation: The patient adamantly denies any suicidal ideations today.  Plan: No  Intent: No  Means: No   Homicidal Ideation: The patient adamantly denies any homicidal ideations today.  Plan: No  Intent: No.  Means: No   General Appearance/Behavior: The patient remains friendly and cooperative with this provider today with a reduction in her unusual thinking.  Eye Contact: Good.  Speech: Appropriate in rate and volume with no pressuring noted today.  Motor Behavior: wnl.  Level of Consciousness: Alert and Oriented x 3.  Mental Status: Alert and Oriented x 3.  Mood: Essentially Euthymic.  Affect: Mildly constricted.  Anxiety Level: No anxiety reported at time of interview today.  Thought Process: The patient continues to display significant unusual thought processes but much improved today. Thought Content: The patient denies any current auditory or visual hallucinations.  She continues to report some unusual thinking which was much reduced today. Perception:. The patient reports some ongoing thought disturbances as described above.  Judgment: Fair.  Insight: Fair.  Cognition: Oriented to person, place and time.  Sleep:  Number of Hours: 6.75    Vital Signs:Blood pressure 90/63, pulse 108, temperature 98.9 F (37.2 C), temperature source Oral, resp. rate 16, height 5\' 11"  (1.803 m), weight 68.493 kg (151 lb), last menstrual period  02/06/2012.  Current Medications: Current Facility-Administered Medications  Medication Dose Route Frequency Provider Last Rate Last Dose  . acetaminophen (TYLENOL) tablet 650 mg  650 mg Oral Q6H PRN Verne Spurr, PA-C      . alum & mag hydroxide-simeth (MAALOX/MYLANTA) 200-200-20 MG/5ML suspension 30 mL  30 mL Oral Q4H PRN Verne Spurr, PA-C      . ARIPiprazole (ABILIFY) tablet 10 mg  10 mg Oral Daily Curlene Labrum Delona Clasby, MD   10 mg at 02/24/12 0809  . benztropine (COGENTIN) tablet 2 mg  2 mg Oral Q6H PRN Wonda Cerise, MD       Or  . benztropine mesylate (COGENTIN) injection 2 mg  2 mg Intramuscular Q6H PRN Wonda Cerise, MD      . divalproex (DEPAKOTE ER) 24 hr tablet 1,000 mg  1,000 mg Oral QHS Curlene Labrum Tayte Childers, MD   1,000 mg at 02/23/12 2201  . haloperidol (HALDOL) tablet 2 mg  2 mg Oral QHS Curlene Labrum Aleksa Catterton, MD   2 mg at 02/23/12 2202  . haloperidol (HALDOL) tablet 5 mg  5 mg Oral Q6H PRN Wonda Cerise, MD      . hydrOXYzine (ATARAX/VISTARIL) tablet 50 mg  50 mg Oral TID PRN Wonda Cerise, MD      . magnesium hydroxide (MILK OF MAGNESIA) suspension 30 mL  30 mL Oral Daily PRN Verne Spurr, PA-C      . sertraline (ZOLOFT) tablet 200 mg  200 mg Oral Daily Curlene Labrum Chayse Zatarain, MD   200 mg at 02/24/12 0809  . traZODone (DESYREL) tablet 150 mg  150 mg Oral QHS,MR X 1 Wonda Cerise, MD   150 mg at 02/23/12  2202   Lab Results:  Results for orders placed during the hospital encounter of 02/16/12 (from the past 48 hour(s))  VALPROIC ACID LEVEL     Status: Normal   Collection Time   02/22/12  7:29 PM      Component Value Range Comment   Valproic Acid Lvl 70.4  50.0 - 100.0 (ug/mL)    Time was spent today discussing with the patient her current symptoms.  The patient states that she slept well last night for the first time since her hospitalization and feels better the morning.  She reports a good appetite and denies any significant feelings of sadness, anhedonia or depressed mood.  She adamantly denies any  suicidal or homicidal ideations as well as any auditory or visual hallucinations today.  She does continue to display some unusual thought process stating that "some of the patients on the unit need to go home and get some sleep." She also states that she feels she has made someone angry.     Treatment Plan Summary:  1. Daily contact with patient to assess and evaluate symptoms and progress in treatment  2. Medication management  3. The patient will deny suicidal ideations or homicidal ideations for 48 hours prior to discharge and have a depression and anxiety rating of 3 or less. The patient will also deny any auditory or visual hallucinations or delusional thinking.  4. The patient will deny any symptoms of substance withdrawal at time of discharge.   Plan:  1. Will continue the patient on her current medications.  2. Laboratory studies reviewed.  3. Will continue to monitor.  4. Possible discharge later this week.  Cheryl Acevedo 02/24/2012, 2:18 PM

## 2012-02-25 NOTE — Progress Notes (Signed)
[facility] MD Progress Note  02/25/2012 12:06 PM  Diagnosis:  Axis I:  Schizoaffective Disorder - Bipolar Type. Major Depressive Disorder - Recurrent.  Obsessive Compulsive Disorder.  Generalized Anxiety Disorder.  Axis II: Schizotypal Personality Disorder.   The patient was seen today and reports the following:   ADL's: Intact.  Sleep: The patient reports to again sleeping very well last night. Appetite: The patient reports an improved appetite today.   Mild>(1-10) >Severe  Hopelessness (1-10): 0  Depression (1-10): 2  Anxiety (1-10): 0   Suicidal Ideation: The patient adamantly denies any suicidal ideations today.  Plan: No  Intent: No  Means: No   Homicidal Ideation: The patient adamantly denies any homicidal ideations today.  Plan: No  Intent: No.  Means: No   General Appearance/Behavior: The patient remains friendly and cooperative with this provider today with ongoing reduction in her unusual thinking.  She again states that some of the patient "are tired and need to go home."  She also states "it makes me sad to see others sad."   Eye Contact: Good.  Speech: Appropriate in rate and volume with no pressuring noted today.  Motor Behavior: wnl.  Level of Consciousness: Alert and Oriented x 3.  Mental Status: Alert and Oriented x 3.  Mood: Mildly depressed.  Affect: Mildly constricted.  Anxiety Level: No anxiety reported or noted at time of interview today.  Thought Process: The patient continues to display some mild unusual thought processes but much improved today. Thought Content: The patient denies any current auditory or visual hallucinations.  She continues to report some unusual thinking which remained much reduced today. Perception:. The patient reports some ongoing thought disturbances as described above.  Judgment: Fair.  Insight: Fair.  Cognition: Oriented to person, place and time.  Sleep:  Number of Hours: 6.75    Vital Signs:Blood pressure 106/72, pulse 102,  temperature 97.9 F (36.6 C), temperature source Oral, resp. rate 16, height 5\' 11"  (1.803 m), weight 68.493 kg (151 lb), last menstrual period 02/06/2012.  Current Medications: Current Facility-Administered Medications  Medication Dose Route Frequency Provider Last Rate Last Dose  . acetaminophen (TYLENOL) tablet 650 mg  650 mg Oral Q6H PRN Verne Spurr, PA-C   650 mg at 02/25/12 1151  . alum & mag hydroxide-simeth (MAALOX/MYLANTA) 200-200-20 MG/5ML suspension 30 mL  30 mL Oral Q4H PRN Verne Spurr, PA-C      . ARIPiprazole (ABILIFY) tablet 10 mg  10 mg Oral Daily Curlene Labrum Ulric Salzman, MD   10 mg at 02/25/12 0808  . benztropine (COGENTIN) tablet 2 mg  2 mg Oral Q6H PRN Wonda Cerise, MD       Or  . benztropine mesylate (COGENTIN) injection 2 mg  2 mg Intramuscular Q6H PRN Wonda Cerise, MD      . divalproex (DEPAKOTE ER) 24 hr tablet 1,000 mg  1,000 mg Oral QHS Curlene Labrum Addylin Manke, MD   1,000 mg at 02/24/12 2203  . haloperidol (HALDOL) tablet 2 mg  2 mg Oral QHS Curlene Labrum Stephanne Greeley, MD   2 mg at 02/24/12 2204  . haloperidol (HALDOL) tablet 5 mg  5 mg Oral Q6H PRN Wonda Cerise, MD      . hydrOXYzine (ATARAX/VISTARIL) tablet 50 mg  50 mg Oral TID PRN Wonda Cerise, MD      . magnesium hydroxide (MILK OF MAGNESIA) suspension 30 mL  30 mL Oral Daily PRN Verne Spurr, PA-C      . sertraline (ZOLOFT) tablet 200 mg  200 mg  Oral Daily Curlene Labrum Meloni Hinz, MD   200 mg at 02/25/12 1610  . traZODone (DESYREL) tablet 150 mg  150 mg Oral QHS,MR X 1 Wonda Cerise, MD   150 mg at 02/24/12 2204   Lab Results:  No results found for this or any previous visit (from the past 48 hour(s)).  Time was spent today discussing with the patient her current symptoms.  The patient reports to sleeping well without difficulty.  She reports that her appetite is improving but until today has never mentioned a decreased appetite.  She reports some mild feelings of sadness, anhedonia and depressed mood but states that she is depressed because "seeing  others sad makes me sad."  She adamantly denies any SI/HI.  The patient also denies any auditory or visual hallucinations or delusional thinking.  She continues to display some unusual thought processes as described above but appears to be approaching baseline.  Treatment Plan Summary:  1. Daily contact with patient to assess and evaluate symptoms and progress in treatment.  2. Medication management  3. The patient will deny suicidal ideations or homicidal ideations for 48 hours prior to discharge and have a depression and anxiety rating of 3 or less. The patient will also deny any auditory or visual hallucinations or delusional thinking.  4. The patient will deny any symptoms of substance withdrawal at time of discharge.   Plan:  1. Will continue the patient on her current medications.  2. Laboratory studies reviewed.  3. Will continue to monitor.  4. Possible discharge later this week.  Klare Criss 02/25/2012, 12:06 PM

## 2012-02-25 NOTE — Progress Notes (Signed)
Pt. Reports that she is fine and is concerned about others and don't want them to be sad and if the other patients are "OK " then she is "OK".   Writer told pt. That staff will take care of the other patients and  encouraged pt . to focus on working on the issues she has while she is here.  Encouragement and support given.  Pt. Receptive.

## 2012-02-25 NOTE — Progress Notes (Signed)
Pt has been up and active in the milieu today.  She filled out her self-inventory and rated her depression a 2 then wrote beside it "I don't like to see people sad" and beside hopelessness she wrote "I have hope".  She rated her anxiety a 2 and feels she is doing much better now and thinks she is ready to go home.  No medication changes thus far.  She did request tylenol for "mensrtrual cramps" at 1151 which she admitted did help her.  She stated,"This is the first period I have had in a while"  She went on to say that she used to have a birthcontrol ring in place but had it removed.  She was unsure of when this took place.  Asked what she was now using for birthcontrol.  She laughed and stated,"I am not having sex with anyone now"  Encouraged her to be sure and talk with her doctor about a birthcontrol method for her.  Pt did voice understanding.

## 2012-02-25 NOTE — Discharge Planning (Signed)
Patient was seen in Aftercare Planning Group, and she continues to show improvement in insight and mood.  She discussed at length her self-awareness and the realization that if she wants to be her best self, she must stay on the medications that helps her to actually be herself.  Case Manager called [facility] referral coordinators for a follow-up appointment, awaiting call back.  Ambrose Mantle, LCSW 02/25/2012, 10:09 AM

## 2012-02-25 NOTE — Progress Notes (Signed)
Lying quietly in bed with eyes closed.  Room very warm ----- thermostat  turned to "high" causing room to feel hot which apparently is her usual night time preference.  No obsessive compulsive behaviors reported or observed this night.

## 2012-02-26 NOTE — Progress Notes (Signed)
Patient ID: Cheryl Acevedo, female   DOB: 03-12-1991, 20 y.o.   MRN: [id number]   Appears to be sleeping at present. Respirations even and non-labored. Will continue to monitor on q 15 minute checks.

## 2012-02-26 NOTE — Progress Notes (Signed)
In dayroom on approach. Appears flat but brightens at intervals. Calm and cooperative with assessment. No acute distress. Was involved in an argument with a peer earlier. She was asked to go to her room as the other pt was escalating rapid and her presence was a contributing factor. She did not understand the request but complied. Writer was able to go back and reassure her she was not being punished and Clinical research associate was somply trying to maintain safety of milieu and regain rapport with peer. She understood the necessity more fully after explanation. States she may D/C in AM. When asked if she was ready, she replied she felt like she did need additional time for her meds to adjust and she feels like her mom has too many questions which cause her unneeded anxiety. Support and encouragement provided. Encouraged to be honest with Tx team in Am and allow them to help her decide. Denies SI/HI/AVH, but did say she can see what other people see. Denies pain. POC and medications reviewed and understanding verbalized. Safety has been maintained with Q18minute observation. Will continue current POC.

## 2012-02-26 NOTE — Discharge Planning (Signed)
Pt was present at group this morning for discharge planning group.  She reported that she felt good, "feels like herself again.  In addition, she reported that she felt at peace with herself.  Pt denied any feelings of hopelessness and depression.  Pt denies SI/HI and no AVH.  Pt stated that she eat well, yet did not sleep as well last night as she had the previous nights due to a disturbance from one of the other patients on her hall.  Pt agreed to follow up [facility] and she does has access to medication.  Upon discharge pt's mother will pick her up from the hospital and she will return to live with her mother. Johnny Gorter Claudette Laws, [place] 02/26/2012  11:43 AM

## 2012-02-26 NOTE — Progress Notes (Signed)
[facility] MD Progress Note  02/26/2012 2:20 PM  Diagnosis:  Axis I:  Schizoaffective Disorder - Bipolar Type. Major Depressive Disorder - Recurrent.  Obsessive Compulsive Disorder.  Generalized Anxiety Disorder.  Axis II: Schizotypal Personality Disorder.   The patient was seen today and reports the following:   ADL's: Intact.  Sleep: The patient reports to having some difficulty sleeping last night secondary to noise on the unit. Appetite: The patient reports a good appetite today.   Mild>(1-10) >Severe  Hopelessness (1-10): 0  Depression (1-10): 0  Anxiety (1-10): 0   Suicidal Ideation: The patient adamantly denies any suicidal ideations today.  Plan: No  Intent: No  Means: No   Homicidal Ideation: The patient adamantly denies any homicidal ideations today.  Plan: No  Intent: No.  Means: No   General Appearance/Behavior: The patient remains friendly and cooperative with this provider today and was appropriate in her thought processes. Eye Contact: Good.  Speech: Appropriate in rate and volume with no pressuring noted today.  Motor Behavior: wnl.  Level of Consciousness: Alert and Oriented x 3.  Mental Status: Alert and Oriented x 3.  Mood: Essentially Euthymic.  Affect: Bright and Full.  Anxiety Level: No anxiety reported today.  Thought Process: wnl. Thought Content: The patient denies any current auditory or visual hallucinations or delusional thinking. Perception:. wnl Judgment: Fair to good.  Insight: Fair to good.  Cognition: Oriented to person, place and time.  Sleep:  Number of Hours: 5.75    Vital Signs:Blood pressure 106/72, pulse 102, temperature 97.9 F (36.6 C), temperature source Oral, resp. rate 16, height 5\' 11"  (1.803 m), weight 68.493 kg (151 lb), last menstrual period 02/06/2012.  Current Medications: Current Facility-Administered Medications  Medication Dose Route Frequency Provider Last Rate Last Dose  . acetaminophen (TYLENOL) tablet 650 mg  650  mg Oral Q6H PRN Verne Spurr, PA-C   650 mg at 02/25/12 1151  . alum & mag hydroxide-simeth (MAALOX/MYLANTA) 200-200-20 MG/5ML suspension 30 mL  30 mL Oral Q4H PRN Verne Spurr, PA-C      . ARIPiprazole (ABILIFY) tablet 10 mg  10 mg Oral Daily Curlene Labrum Rhyatt Muska, MD   10 mg at 02/26/12 0749  . benztropine (COGENTIN) tablet 2 mg  2 mg Oral Q6H PRN Wonda Cerise, MD       Or  . benztropine mesylate (COGENTIN) injection 2 mg  2 mg Intramuscular Q6H PRN Wonda Cerise, MD      . divalproex (DEPAKOTE ER) 24 hr tablet 1,000 mg  1,000 mg Oral QHS Curlene Labrum Robinn Overholt, MD   1,000 mg at 02/25/12 2224  . haloperidol (HALDOL) tablet 2 mg  2 mg Oral QHS Curlene Labrum Dawid Dupriest, MD   2 mg at 02/25/12 2224  . haloperidol (HALDOL) tablet 5 mg  5 mg Oral Q6H PRN Wonda Cerise, MD      . hydrOXYzine (ATARAX/VISTARIL) tablet 50 mg  50 mg Oral TID PRN Wonda Cerise, MD      . magnesium hydroxide (MILK OF MAGNESIA) suspension 30 mL  30 mL Oral Daily PRN Verne Spurr, PA-C      . sertraline (ZOLOFT) tablet 200 mg  200 mg Oral Daily Curlene Labrum Liala Codispoti, MD   200 mg at 02/26/12 0749  . traZODone (DESYREL) tablet 150 mg  150 mg Oral QHS,MR X 1 Wonda Cerise, MD   150 mg at 02/25/12 2224   Lab Results:  No results found for this or any previous visit (from the past 48 hour(s)).  Time  was spent today discussing with the patient her current symptoms.  The patient reports to having some difficulty sleeping last night due to noise on the unit.  She reports a good appetite and denies any SI/HI.  She also denies any auditory or visual hallucinations or delusional thinking.  She also denies any significant depressive symptoms.  The patient states that she feels ready for discharge soon and would like to arrange with her mother to transport her home tomorrow.  Treatment Plan Summary:  1. Daily contact with patient to assess and evaluate symptoms and progress in treatment.  2. Medication management  3. The patient will deny suicidal ideations or homicidal  ideations for 48 hours prior to discharge and have a depression and anxiety rating of 3 or less. The patient will also deny any auditory or visual hallucinations or delusional thinking.  4. The patient will deny any symptoms of substance withdrawal at time of discharge.   Plan:  1. Will continue the patient on her current medications.  2. Laboratory studies reviewed.  3. Will continue to monitor.  4. Likely discharge tomorrow.  Rasheed Welty 02/26/2012, 2:20 PM

## 2012-02-26 NOTE — Progress Notes (Signed)
Patient ID: Cheryl Acevedo, female   DOB: 1991-01-20, 20 y.o.   MRN: [id number] Called patient's mother to inform her that the doctor planned to discharge patient on Friday. Left a message asking her to call with any questions.

## 2012-02-26 NOTE — Progress Notes (Signed)
Patient up and in the milieu today.  Attending groups.  Interacting well with staff and peers.  States she feels ready for discharge tomorrow and is looking forward to it.  Denies depressive symptoms or thoughts of self harm.  Appetite good.  Has been pleasant and cooperative today.

## 2012-02-27 MED ORDER — TRAZODONE HCL 150 MG PO TABS: 150.0000 mg | ORAL_TABLET | Freq: Every evening | ORAL | Status: DC | PRN

## 2012-02-27 MED ORDER — SERTRALINE HCL 100 MG PO TABS: 200.0000 mg | ORAL_TABLET | Freq: Every day | ORAL | Status: DC

## 2012-02-27 MED ORDER — HALOPERIDOL 2 MG PO TABS: 2.0000 mg | ORAL_TABLET | Freq: Every day | ORAL | Status: DC

## 2012-02-27 MED ORDER — TRAZODONE HCL 50 MG PO TABS
50.0000 mg | ORAL_TABLET | Freq: Every evening | ORAL | Status: DC | PRN
  Administered 2012-02-27 – 2012-02-29 (×3): 50 mg via ORAL
  Filled 2012-02-27 (×6): qty 1
  Filled 2012-02-27: qty 14
  Filled 2012-02-27 (×2): qty 1

## 2012-02-27 MED ORDER — DIVALPROEX SODIUM ER 500 MG PO TB24: 1000.0000 mg | ORAL_TABLET | Freq: Every day | ORAL | Status: DC

## 2012-02-27 MED ORDER — ARIPIPRAZOLE 10 MG PO TABS: 10.0000 mg | ORAL_TABLET | Freq: Every day | ORAL | Status: DC

## 2012-02-27 MED ORDER — HALOPERIDOL 5 MG PO TABS
5.0000 mg | ORAL_TABLET | Freq: Every day | ORAL | Status: DC
  Administered 2012-02-27 – 2012-02-29 (×3): 5 mg via ORAL
  Filled 2012-02-27 (×5): qty 1

## 2012-02-27 NOTE — Progress Notes (Signed)
[facility] MD Progress Note  02/27/2012 3:51 PM  Diagnosis:  Axis I:  Schizoaffective Disorder - Bipolar Type. Major Depressive Disorder - Recurrent.  Obsessive Compulsive Disorder.  Generalized Anxiety Disorder.  Axis II: Schizotypal Personality Disorder.   The patient was seen today and reports the following:   ADL's: Intact.  Sleep: The patient reports to again sleeping very well last night. Appetite: The patient reports an improved appetite today.   Mild>(1-10) >Severe  Hopelessness (1-10): 0  Depression (1-10): 0  Anxiety (1-10): 0   Suicidal Ideation: The patient adamantly denies any suicidal ideations today.  Plan: No  Intent: No  Means: No   Homicidal Ideation: The patient adamantly denies any homicidal ideations today.  Plan: No  Intent: No.  Means: No   General Appearance/Behavior: The patient remains friendly and cooperative with this provider today but was again preoccupied with the safety of patient's on the unit and with her Mother. Eye Contact: Good.  Speech: Appropriate in rate and volume with no pressuring noted today.  Motor Behavior: wnl.  Level of Consciousness: Alert and Oriented x 3.  Mental Status: Alert and Oriented x 3.  Mood: Appears mildly depressed.  Affect: Appears mildly constricted.  Anxiety Level: No anxiety reported or noted at time of interview today.  Thought Process: The patient continues to display some mild unusual thought processes which is worse today. Thought Content: The patient denies any current auditory or visual hallucinations.  She continues to report some unusual thinking which is worse today. Perception:. The patient reports some ongoing thought disturbances as described above.  Judgment: Fair.  Insight: Fair.  Cognition: Oriented to person, place and time.  Sleep:  Number of Hours: 6.75    Vital Signs:Blood pressure 105/68, pulse 85, temperature 98 F (36.7 C), temperature source Oral, resp. rate 18, height 5\' 11"  (1.803 m),  weight 68.493 kg (151 lb), last menstrual period 02/06/2012.  Current Medications: Current Facility-Administered Medications  Medication Dose Route Frequency Provider Last Rate Last Dose  . acetaminophen (TYLENOL) tablet 650 mg  650 mg Oral Q6H PRN Verne Spurr, PA-C   650 mg at 02/25/12 1151  . alum & mag hydroxide-simeth (MAALOX/MYLANTA) 200-200-20 MG/5ML suspension 30 mL  30 mL Oral Q4H PRN Verne Spurr, PA-C      . ARIPiprazole (ABILIFY) tablet 10 mg  10 mg Oral Daily Curlene Labrum Angle Karel, MD   10 mg at 02/27/12 0835  . benztropine (COGENTIN) tablet 2 mg  2 mg Oral Q6H PRN Wonda Cerise, MD       Or  . benztropine mesylate (COGENTIN) injection 2 mg  2 mg Intramuscular Q6H PRN Wonda Cerise, MD      . divalproex (DEPAKOTE ER) 24 hr tablet 1,000 mg  1,000 mg Oral QHS Curlene Labrum Ocie Tino, MD   1,000 mg at 02/26/12 2217  . haloperidol (HALDOL) tablet 5 mg  5 mg Oral Q6H PRN Wonda Cerise, MD      . haloperidol (HALDOL) tablet 5 mg  5 mg Oral QHS Curlene Labrum Sherine Cortese, MD      . hydrOXYzine (ATARAX/VISTARIL) tablet 50 mg  50 mg Oral TID PRN Wonda Cerise, MD      . magnesium hydroxide (MILK OF MAGNESIA) suspension 30 mL  30 mL Oral Daily PRN Verne Spurr, PA-C      . sertraline (ZOLOFT) tablet 200 mg  200 mg Oral Daily Curlene Labrum Jaylinn Hellenbrand, MD   200 mg at 02/27/12 0835  . traZODone (DESYREL) tablet 50 mg  50 mg  Oral QHS,MR X 1 Curlene Labrum Siarra Gilkerson, MD      . DISCONTD: haloperidol (HALDOL) tablet 2 mg  2 mg Oral QHS Curlene Labrum Keileigh Vahey, MD   2 mg at 02/26/12 2217  . DISCONTD: traZODone (DESYREL) tablet 150 mg  150 mg Oral QHS,MR X 1 Wonda Cerise, MD   150 mg at 02/26/12 2217   Lab Results:  No results found for this or any previous visit (from the past 48 hour(s)).  Time was spent today discussing with the patient her current symptoms.  The patient reports to sleeping well without difficulty.  She reports that her appetite is good and denies any depressive symptoms.  However the patient appears to have mild feelings of sadness,  anhedonia and depressed mood.  She adamantly denies any SI/HI.  The patient also denies any auditory or visual hallucinations or delusional thinking.  She continues to display some unusual thought processes today which have worsened.  She reports concerns about the safety of patient's on the unit as well as the safety of her Mother.    The patient's Mother phoned the Case Manager today and reported that these safety concerns were voiced last night by the patient and she does not feel the patient is ready to discharge and is reluctant to allow her to return home today.  Treatment Plan Summary:  1. Daily contact with patient to assess and evaluate symptoms and progress in treatment.  2. Medication management  3. The patient will deny suicidal ideations or homicidal ideations for 48 hours prior to discharge and have a depression and anxiety rating of 3 or less. The patient will also deny any auditory or visual hallucinations or delusional thinking.  4. The patient will deny any symptoms of substance withdrawal at time of discharge.   Plan:  1. Will continue the patient on her current medications.  2. Will increase the medication Haldol to 5 mgs po qhs to further address the patient's thought disturbances. 3. Will decrease the medication Trazodone to 50 mgs po qhs to help prevent oversedation with the above increase in Haldol. 4. Laboratory studies reviewed.  5. Will continue to monitor.  5. Planned discharge for today was cancelled secondary to concerns voiced by patient's Mother.  Kamesha Herne 02/27/2012, 3:51 PM

## 2012-02-27 NOTE — Progress Notes (Signed)
Pt tearful and paranoid. Mother is present. Pt states that "people" are talking about her and she "will not put up with them talking about her personal life when they don't know her from [place]." Pt would not state the specifics of what was said, nor would she state who said it. Pt states that the comments about her were said "indirectly."

## 2012-02-27 NOTE — Discharge Planning (Signed)
Met with patient in Aftercare Planning Group.  She expressed readiness for discharge, and was pleasant.  She does anticipate going back to work, and requests a letter at discharge.  During Treatment Team, it was brought up by MHT that mother is very concerned about patient's readiness for discharge.  At direction of team, Case Manager called her mother Hartley Barefoot [phone] to discuss concerns.  She voiced the following:   Patient is obsessing about the things she is paranoid about, whispering everything in her mother's ear.  Says "you can't trust anyone here.")  Keeps telling mother she (mother) is not safe and needs to go on vacation.  Says that she cannot tell mother why she is not safe, that secret could also make her not safe.  Is checking on mother's safety through phone calls multiple times daily.  She has taken an empty bottle and put water in it, says she has added mascara although that cannot be seen, and says it is for her hair, that she is putting it on her hair, although it just appears to be water.  Is washing her floors and bathroom with towels multiple times daily.  Is paranoid about some staff members here more than others (We also saw this in Treatment Team, when she described whom she trusts and whom she does not.)  Case Manager mentioned to mother that people on the hall are quite sick right now, and we question how good this may be for the patient, especially because she keeps wanting to take care of the other patients.  She responded that it is always the first sign to her that patient is getting sick when she wants to help other people intensely, that she feels she sees signs that they need help.  Case Manager asked mother if patient will still be able to come home if we discharge her today, and she responded that she does not know, since she has no way of knowing where patient will end up.  She said that when she is at work if patient becomes scared, she does not know what  lengths patient will go to in order to hide, and if they would be able to find her.  She also said this is the worst episode patient has had since her first one at the age of 70.  Case Manager spoke to doctor, who decided to keep patient over the weekend.  Mother was once again called, was informed of this.  She asked if any medications could have made this change to more paranoia, and CM reviewed, told her no significant changes had been made mid-week.  Utilization review done due to unexpected continued stay.  Ambrose Mantle, LCSW 02/27/2012, 12:42 PM

## 2012-02-27 NOTE — Progress Notes (Signed)
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  Therapist prompted Pts to identify what they plan to do after discharge to improve their life.  Pt reported she plans to take her medications as prescribed and get plenty of sleep.  She is also looking forward to riding her horse.  Pt was confronted on care-taking in group.   Therapist explained how the use of illicit drugs could cause brain damage.  Therapist encouraged Pt's not to give up and understand that treatment is a process.  Therapist offered support and encouragement. Minimal Progress noted.    Marni Griffon C 02/27/2012, 3:20 PM

## 2012-02-27 NOTE — Progress Notes (Addendum)
Patient ID: Cheryl Acevedo, female   DOB: [birth year], 20 y.o.   MRN: [id number] The patient was very quiet and isolative this evening. She only attended evening group for a few minutes. Stated she was staying away from everyone because she didn't want any trouble. She was unable to explain what she meant by that statement. Compliant with medication.

## 2012-02-27 NOTE — Progress Notes (Signed)
[facility] Group Notes:  (Counselor/Nursing/MHT/Case Management/Adjunct)  02/27/2012 8:00 AM  Type of Therapy:  Music Therapy  Participation Level:  Active  Participation Quality:  Attentive and Sharing  Affect:  Appropriate  Cognitive:  Oriented  Insight:  Good  Engagement in Group:  Good  Engagement in Therapy:  Good  Modes of Intervention:  Activity, Education and Support  Summary of Progress/Problems: Patient participated in music relaxation activities and stated that she uses music to relax at home. Also listens to [employer] when she is trying to get out her anger.   Vartan Kerins 02/27/2012, 8:00 AM

## 2012-02-27 NOTE — Progress Notes (Signed)
[facility] Group Notes:  (Counselor/Nursing/MHT/Case Management/Adjunct)  02/27/2012 7:58 AM  Type of Therapy:  Group Therapy 02/25/12  Participation Level:  Active  Participation Quality:  Attentive and Sharing  Affect:  Appropriate  Cognitive:  Oriented  Insight:  Good  Engagement in Group:  Good  Engagement in Therapy:  Good  Modes of Intervention:  Education, Problem-solving and Support  Summary of Progress/Problems: Patient stated that she is doing better. She appears less anxious. Talked about being concerned about people but can also focus on herself.   Reisa Coppola, Aram Beecham 02/27/2012, 7:58 AM

## 2012-02-27 NOTE — Tx Team (Addendum)
Interdisciplinary Treatment Plan Update (Adult)  Date:  02/27/2012  Time Reviewed:  10:15AM-11:15AM  Progress in Treatment: Attending groups:  Yes Participating in groups:   Yes , fully engaged Taking medication as prescribed:    Yes, no refusals Tolerating medication:   Yes, side effects have not been reported by patient or noted by staff Family/Significant other contact made:  Yes, with mother Patient understands diagnosis:   Yes, to some extent Discussing patient identified problems/goals with staff:   Yes, fully cooperative Medical problems stabilized or resolved:   Yes Denies suicidal/homicidal ideation:  Yes Issues/concerns per patient self-inventory:   None Other:    New problem(s) identified: Yes, Describe:  mother is concerned about her discharging today.  Case Manager will contact mother prior to D/C.  Reason for Continuation of Hospitalization: None  Interventions implemented related to continuation of hospitalization:  Medication monitoring and adjustment, safety checks Q15 min., suicide risk assessment, group therapy, psychoeducation, collateral contact, aftercare planning, ongoing physician assessments, medication education - UNTIL DISCHARGE  Additional comments:  Not applicable  Estimated length of stay:  Discharge today  Discharge Plan:  Go home to live with mother, follow up with [facility]  New goal(s):  Not applicable  Review of initial/current patient goals per problem list:   1.  Goal(s):  Reduce paranoia to baseline per patient and family.  Met:  Yes  Target date:  By Discharge   As evidenced by:  Stable  2.  Goal(s):  Reduce anxiety from admission level to no greater than 3 at discharge.  Met:  Yes  Target date:  By Discharge   As evidenced by:  "0" today  3.  Goal(s):  Determine aftercare placement.  Met:  Yes  Target date:  By Discharge   As evidenced by:  Will live with mother  4.  Goal(s):  Medication stabilization so OCD symptoms under  control.  Met:  Yes  Target date:  By Discharge   As evidenced by:  stable  5.  Goal(s):  Reduce racing thoughts to baseline.   Met:  Yes  Target date:  By Discharge   As evidenced by:  Stable, under control  6.  Goal(s):  Return to normal sleep pattern of 6+ hrs per night.   Met:  Yes  Target date:  By Discharge   As evidenced by:  Has been having more than 6 hours nightly  5.  Goal(s):  Deny Auditory and Visual hallucinations and delusions.  Met:  Yes  Target date:  By Discharge   As evidenced by:  Denies all Attendees: Patient:  Cheryl Acevedo  02/27/2012 10:15AM-11:15AM  Family:     Physician:  Dr. Harvie Heck Readling 02/27/2012 10:15AM-11:15AM  Nursing:   Waynetta Sandy, RN 02/27/2012 10:15AM -11:15AM   Case Manager:  Ambrose Mantle, LCSW 02/27/2012 10:15AM-11:15AM  Counselor:  Veto Kemps, MT-BC 02/27/2012 10:15AM-11:15AM  Other:   Verne Spurr, PA 02/27/2012 10:15AM-11:15AM  Other:      Other:      Other:       Scribe for Treatment Team:   Sarina Ser, 02/27/2012, 10:15AM-11:15AM   Due to concerns voice by mother, discharge has been postponed.  Case Manager called mother, see progress note for information.  Ambrose Mantle, LCSW 02/27/2012, 12:23 PM

## 2012-02-27 NOTE — Progress Notes (Signed)
[facility] Group Notes:  (Counselor/Nursing/MHT/Case Management/Adjunct)  02/27/2012 7:59 AM  Type of Therapy:  Group Therapy 02/26/12  Participation Level:  Active  Participation Quality:  Attentive and Sharing  Affect:  Appropriate  Cognitive:  Oriented  Insight:  Good  Engagement in Group:  Good  Engagement in Therapy:  Good  Modes of Intervention:  Education and Support  Summary of Progress/Problems: Patient continues to show improvement. She is looking forward to going home. Talked about need to take her medications.   Branson Kranz, Aram Beecham 02/27/2012, 7:59 AM

## 2012-02-28 DIAGNOSIS — F332 Major depressive disorder, recurrent severe without psychotic features: Secondary | ICD-10-CM

## 2012-02-28 NOTE — Progress Notes (Signed)
[facility] Group Notes:  (Counselor/Nursing/MHT/Case Management/Adjunct)  02/28/2012 5:23 PM  Type of Therapy:  Group Therapy  Participation Level:  Active  Participation Quality:  Appropriate  Affect:  Appropriate  Cognitive:  Appropriate  Insight:  Good  Engagement in Group:  Good  Engagement in Therapy:  Good  Modes of Intervention:  Education, Limit-setting and Support  Summary of Progress/Problems: The pt. participated in group on self-sabotaging behaviors and was asked how they were doing today and to share what color that they felt like today. Pt. Stated she felt like a yellow orange due to it reminding er of the sun. The pt. stated that the pt. color she did not want to feel like  was brown. Pt. discussed making her self feel better and by moving ahead  and helping herself. Pt. Did talk and at times said some similar things but appears to be enjoying the groups and after group the pt. told the therapist that their groups were helpful to her.   Lamar Blinks [place] 02/28/2012, 5:23 PM

## 2012-02-28 NOTE — Progress Notes (Signed)
[facility] MD Progress Note  02/28/2012 2:16 PM  Diagnosis:  Axis I: Generalized Anxiety Disorder, Major Depression, Recurrent severe, Obsessive Compulsive Disorder and Schizoaffective Disorder  ADL's:  Intact  Sleep: Good  Appetite:  Good  Suicidal Ideation:  Plan:  no Intent:  no Means:  no Homicidal Ideation:  Plan:  no Intent:  no Means:  no  AEB (as evidenced by): Patient has no complaints today.  Mental Status Examination/Evaluation: Objective:  Appearance: Casual, Fairly Groomed, Neat and Well Groomed  Eye Contact::  Good  Speech:  Clear and Coherent and Normal Rate  Volume:  Normal  Mood:  Euthymic  Affect:  Appropriate, Congruent and Full Range  Thought Process:  Coherent and Goal Directed  Orientation:  Full  Thought Content:  WDL  Suicidal Thoughts:  No  Homicidal Thoughts:  No  Memory:  Immediate;   Good  Judgement:  Intact  Insight:  Fair  Psychomotor Activity:  Normal  Concentration:  Fair  Recall:  Good  Akathisia:  No  Handed:  Right  AIMS (if indicated):     Assets:  Communication Skills Desire for Improvement Housing Leisure Time Physical Health Resilience Social Support Transportation  Sleep:  Number of Hours: 6.75    Vital Signs:Blood pressure 97/63, pulse 100, temperature 97.9 F (36.6 C), temperature source Oral, resp. rate 18, height 5\' 11"  (1.803 m), weight 151 lb (68.493 kg), last menstrual period 02/06/2012. Current Medications: Current Facility-Administered Medications  Medication Dose Route Frequency Provider Last Rate Last Dose  . acetaminophen (TYLENOL) tablet 650 mg  650 mg Oral Q6H PRN Verne Spurr, PA-C   650 mg at 02/25/12 1151  . alum & mag hydroxide-simeth (MAALOX/MYLANTA) 200-200-20 MG/5ML suspension 30 mL  30 mL Oral Q4H PRN Verne Spurr, PA-C      . ARIPiprazole (ABILIFY) tablet 10 mg  10 mg Oral Daily Curlene Labrum Readling, MD   10 mg at 02/28/12 0808  . benztropine (COGENTIN) tablet 2 mg  2 mg Oral Q6H PRN Wonda Cerise, MD         Or  . benztropine mesylate (COGENTIN) injection 2 mg  2 mg Intramuscular Q6H PRN Wonda Cerise, MD      . divalproex (DEPAKOTE ER) 24 hr tablet 1,000 mg  1,000 mg Oral QHS Curlene Labrum Readling, MD   1,000 mg at 02/27/12 2058  . haloperidol (HALDOL) tablet 5 mg  5 mg Oral Q6H PRN Wonda Cerise, MD      . haloperidol (HALDOL) tablet 5 mg  5 mg Oral QHS Curlene Labrum Readling, MD   5 mg at 02/27/12 2059  . hydrOXYzine (ATARAX/VISTARIL) tablet 50 mg  50 mg Oral TID PRN Wonda Cerise, MD      . magnesium hydroxide (MILK OF MAGNESIA) suspension 30 mL  30 mL Oral Daily PRN Verne Spurr, PA-C      . sertraline (ZOLOFT) tablet 200 mg  200 mg Oral Daily Curlene Labrum Readling, MD   200 mg at 02/28/12 7425  . traZODone (DESYREL) tablet 50 mg  50 mg Oral QHS,MR X 1 Curlene Labrum Readling, MD   50 mg at 02/27/12 2058  . DISCONTD: haloperidol (HALDOL) tablet 2 mg  2 mg Oral QHS Curlene Labrum Readling, MD   2 mg at 02/26/12 2217  . DISCONTD: traZODone (DESYREL) tablet 150 mg  150 mg Oral QHS,MR X 1 Wonda Cerise, MD   150 mg at 02/26/12 2217    Lab Results: No results found for this or any previous visit (from  the past 48 hour(s)).  Physical Findings: AIMS:  , ,  ,  ,    CIWA:    COWS:     Treatment Plan Summary: Daily contact with patient to assess and evaluate symptoms and progress in treatment Medication management  Plan: Awaiting for discharge and waiting for clearance   Ambreen Tufte,JANARDHAHA R. 02/28/2012, 2:16 PM

## 2012-02-28 NOTE — Progress Notes (Signed)
Patient ID: EVVA DIN, female   DOB: 1991-02-06, 20 y.o.   MRN: [id number] The patient was much improved from yesterday. She was able to tolerate being in the dayroom all evening. Attended and actively participated in the evening group. Interacted appropriately in the milieu. Her thoughts and speech were logical and relevant. Compliant wit medication.

## 2012-02-28 NOTE — Progress Notes (Signed)
[facility] Group Notes:  (Counselor/Nursing/MHT/Case Management/Adjunct)  02/28/2012 10:30 am   Type of Therapy:  Discharge Planning  Summary of Progress/Problems: Pt. attended and participated in aftercare planning group. Suicide prevention information was given as well as crisis line numbers to use. Pt described her mood today as the color yellow and orange. Pt stated that she wants to stay on her medications everyday. Pt stated that she is happy and that she is not letting things get to her anymore. Pt denies SI/HI.       [place], Desiree 02/28/2012; 3:15 PM

## 2012-02-28 NOTE — Progress Notes (Signed)
Pt is sad and depressed this morning   She took her medications and was able to indicate the dosages and theraputic use  She is calm and cooperative and compliant with treatment  Verbal support given  Medications administered and effectiveness monitored  Q 15 min checks  Pt safe at present

## 2012-02-29 LAB — COMPREHENSIVE METABOLIC PANEL
ALT: 64 U/L — ABNORMAL HIGH (ref 0–35)
Calcium: 9.4 mg/dL (ref 8.4–10.5)
Creatinine, Ser: 0.81 mg/dL (ref 0.50–1.10)
GFR calc Af Amer: >90 mL/min (ref >90)
Glucose, Bld: 104 mg/dL — ABNORMAL HIGH (ref 70–99)
Sodium: 139 mEq/L (ref 135–145)
Total Protein: 7.3 g/dL (ref 6.0–8.3)

## 2012-02-29 LAB — CBC
Hemoglobin: 14.1 g/dL (ref 12.0–15.0)
MCH: 28.7 pg (ref 26.0–34.0)
MCHC: 33.6 g/dL (ref 30.0–36.0)
MCV: 85.5 fL (ref 78.0–100.0)
Platelets: 202 10*3/uL (ref 150–400)

## 2012-02-29 LAB — DIFFERENTIAL
Eosinophils Relative: 1 % (ref 0–5)
Lymphocytes Relative: 55 % — ABNORMAL HIGH (ref 12–46)
Lymphs Abs: 3.3 10*3/uL (ref 0.7–4.0)
Monocytes Relative: 10 % (ref 3–12)

## 2012-02-29 NOTE — Progress Notes (Signed)
Pt is pleasant and appropriate  She is compliant with treatment and her thinking is logical and coherent   She attends groups and interacts appropriately with others   Verbal support given  Medications administered and effectiveness monitored  q 15 min checks  Pt safe at present

## 2012-02-29 NOTE — Progress Notes (Signed)
Patient ID: Cheryl Acevedo, female   DOB: [birth year], 20 y.o.   MRN: [id number] The patient interacted appropriately in the milieu all evening. She attended and participated in the evening group. She asked for her medication to go to bed because she was getting anxious due to a female patient getting loud and threatening. Praised for her good judgment to remove herself from the situation.

## 2012-02-29 NOTE — Progress Notes (Signed)
[facility] MD Progress Note  02/29/2012 12:18 PM  Diagnosis:  Axis I: Generalized Anxiety Disorder, Obsessive Compulsive Disorder and Schizoaffective Disorder  ADL's:  Intact  Sleep: Good  Appetite:  Good  Suicidal Ideation:  Plan:  no Intent:  no Means:  no Homicidal Ideation:  Plan:  no Intent:  no Means:  no  AEB (as evidenced by):  Mental Status Examination/Evaluation: Objective:  Appearance: Casual, Fairly Groomed, Neat and Well Groomed  Eye Contact::  Good  Speech:  Clear and Coherent  Volume:  Normal  Mood:  Euthymic  Affect:  Congruent and Full Range  Thought Process:  Coherent  Orientation:  Full  Thought Content:  WDL  Suicidal Thoughts:  No  Homicidal Thoughts:  No  Memory:  Immediate;   Fair  Judgement:  Good  Insight:  Good  Psychomotor Activity:  Normal  Concentration:  Fair  Recall:  Fair  Akathisia:  No  Handed:  Right  AIMS (if indicated):     Assets:  Communication Skills Physical Health  Sleep:  Number of Hours: 6    Vital Signs:Blood pressure 97/67, pulse 80, temperature 97.4 F (36.3 C), temperature source Oral, resp. rate 20, height 5\' 11"  (1.803 m), weight 151 lb (68.493 kg), last menstrual period 02/06/2012. Current Medications: Current Facility-Administered Medications  Medication Dose Route Frequency Provider Last Rate Last Dose  . acetaminophen (TYLENOL) tablet 650 mg  650 mg Oral Q6H PRN Verne Spurr, PA-C   650 mg at 02/25/12 1151  . alum & mag hydroxide-simeth (MAALOX/MYLANTA) 200-200-20 MG/5ML suspension 30 mL  30 mL Oral Q4H PRN Verne Spurr, PA-C      . ARIPiprazole (ABILIFY) tablet 10 mg  10 mg Oral Daily Curlene Labrum Readling, MD   10 mg at 02/29/12 0800  . benztropine (COGENTIN) tablet 2 mg  2 mg Oral Q6H PRN Wonda Cerise, MD       Or  . benztropine mesylate (COGENTIN) injection 2 mg  2 mg Intramuscular Q6H PRN Wonda Cerise, MD      . divalproex (DEPAKOTE ER) 24 hr tablet 1,000 mg  1,000 mg Oral QHS Curlene Labrum Readling, MD   1,000 mg at  02/28/12 2207  . haloperidol (HALDOL) tablet 5 mg  5 mg Oral Q6H PRN Wonda Cerise, MD      . haloperidol (HALDOL) tablet 5 mg  5 mg Oral QHS Curlene Labrum Readling, MD   5 mg at 02/28/12 2207  . hydrOXYzine (ATARAX/VISTARIL) tablet 50 mg  50 mg Oral TID PRN Wonda Cerise, MD      . magnesium hydroxide (MILK OF MAGNESIA) suspension 30 mL  30 mL Oral Daily PRN Verne Spurr, PA-C      . sertraline (ZOLOFT) tablet 200 mg  200 mg Oral Daily Curlene Labrum Readling, MD   200 mg at 02/29/12 0800  . traZODone (DESYREL) tablet 50 mg  50 mg Oral QHS,MR X 1 Curlene Labrum Readling, MD   50 mg at 02/28/12 2208    Lab Results: No results found for this or any previous visit (from the past 48 hour(s)).  Physical Findings: AIMS:  , ,  ,  ,    CIWA:    COWS:     Treatment Plan Summary: Daily contact with patient to assess and evaluate symptoms and progress in treatment Medication management  Plan: Pending discharge home tomorrow. No medication changes today.  Takina Busser,JANARDHAHA R. 02/29/2012, 12:18 PM

## 2012-02-29 NOTE — Progress Notes (Signed)
[facility] Group Notes:  (Counselor/Nursing/MHT/Case Management/Adjunct)  02/29/2012 4:28 PM  Type of Therapy:  Group Therapy  Participation Level:  Active  Participation Quality:  Appropriate and Attentive  Affect:  Appropriate  Cognitive:  Appropriate  Insight:  Good  Engagement in Group:  Good  Engagement in Therapy:  Good  Modes of Intervention:  Clarification, Socialization and Support    Summary of Progress/Problems: Pt. participated in group on supports and how to support themselves and make themselves feel better. Pt. Spoke about being a support to her self and about her mother being a great support to her. The pt. Spoke about the fact that if her supports were not around that she would learn to survive and adapt to the situation.   Cheryl Acevedo [place] 02/29/2012, 4:28 PM

## 2012-02-29 NOTE — Progress Notes (Signed)
[facility] Group Notes:  (Counselor/Nursing/MHT/Case Management/Adjunct)  02/29/2012 10:45 AM  Type of Therapy:  Discharge Planning  Summary of Progress/Problems: Pt. attended and participated in aftercare planning group. [facility] support group information was given as well as information on suicide prevention information, warning signs to look for with suicide and crisis line numbers to use. Pt stated that she is feeling neutral and emotional today. Pt stated that she does not want to say things that people my misunderstand.   [place], Desiree 02/29/2012, 3:51 PM

## 2012-03-01 MED ORDER — TRAZODONE HCL 50 MG PO TABS: 50.0000 mg | ORAL_TABLET | Freq: Every evening | ORAL | Status: DC | PRN

## 2012-03-01 MED ORDER — HALOPERIDOL 5 MG PO TABS: 5.0000 mg | ORAL_TABLET | Freq: Every day | ORAL | Status: DC

## 2012-03-01 MED ORDER — ARIPIPRAZOLE 10 MG PO TABS: 10.0000 mg | ORAL_TABLET | Freq: Every day | ORAL | Status: DC

## 2012-03-01 NOTE — Progress Notes (Signed)
03/01/2012         Time: 0930      Group Topic/Focus: The focus of this group is on discussing various styles of communication and communicating assertively using 'I' (feeling) statements.  Participation Level: Active  Participation Quality: Attentive  Affect: Blunted  Cognitive: Oriented   Additional Comments: Patient quiet, cooperative. Patient able to identify positive communication strategies.   Emmalea Treanor 03/01/2012 2:10 PM

## 2012-03-01 NOTE — Discharge Planning (Signed)
Met with patient in Aftercare Planning Group.   She was quiet and affirmed that she needs a letter for her employer at discharge, which she hopes will be today.  Mother has asked to meet with [place] in Treatment Team again, but Case Manager spoke with her by phone and found out her concerns.  She states that on Saturday patient was visited by mother and a friend, and was whispering in their ears still about things she is paranoid about.  She stated that her "neighbor", another patient in the next room, is planning a murder.  She stated that another patient is with the [employer] and has been watching her for 5 years.  She stated that yet another patient is part of the [place] family, and that Donn Pierini is not dead but is paying the hospital a lot of money.  Mother is concerned because these delusions seem very real to the patient.  Case Manager shared with mother that part of the issue is that patient is with other quite sick patients, who have in their minds their own realities, and share them as such with each other.  Thus, she is exposed to things that she will not be exposed to at home.  For instance, the delusion about Donn Pierini came from another patient who called her after his discharge to share that delusion.  CM encouraged mother that patient will continue to get better and better as she gets home to where she is comfortable and to where she is not surrounded by illness that contributes to her own delusions.  Mother agreed and is ready to come get her today at 1pm.  Mother asked about the possibility of starting patient on the new Abilify injection in the outpatient setting at [facility].  Case Manager called the pharmacy at [facility] and found out that they are not yet doing that injection, but are anticipating going through training later this month.  At some point in the future, it will be offered as an option, but it is not currently.  Case Manager called mother to convey this information to  her.  Ambrose Mantle, LCSW 03/01/2012, 9:55 AM

## 2012-03-01 NOTE — Progress Notes (Addendum)
Pt denies SI/HI/AVH. Pt given discharge instructions, f/u appt information, and prescriptions. Pt states understanding of given information. Pt given all belongings from locker 24. Pt states that all belongings received.

## 2012-03-01 NOTE — Progress Notes (Signed)
[facility] Case Management Discharge Plan:  Will you be returning to the same living situation after discharge: No.  Will live with mother At discharge, do you have transportation home?:Yes,  with mother Do you have the ability to pay for your medications:Yes,  insurance and mother's assistance  Interagency Information:     Release of information consent forms completed and in the chart;  Patient's signature needed at discharge.  Patient to Follow up at:  Follow-up Information    Schedule an appointment as soon as possible for a visit with Dr. Ladona Ridgel. [facility] is open 8AM-3PM;  Please go  Wednesday 5/1 or Thursday 5/2 for a hospital discharge visit.)    Contact information:   [facility] [address]  [address] Telephone:  [phone]         Patient denies SI/HI:   Yes,      Safety Planning and Suicide Prevention discussed:  Yes,  During multiple Aftercare Planning Groups, Case Managers provided psychoeducation on "Suicide Prevention Information."  This included descriptions of risk factors for suicide, warning signs that an individual is in crisis and thinking of suicide, and what to do if this occurs.  Pt indicated understanding of information provided, and will read brochure given upon discharge.     Barrier to discharge identified:No.  Summary and Recommendations:  Patient will go to [facility] for medication management, where they will consider possibly transitioning to an injectable medication.  She could benefit from ongoing therapy and skill-building groups.   Sarina Ser 03/01/2012, 11:30 AM

## 2012-03-01 NOTE — Progress Notes (Signed)
[facility] Group Notes:  (Counselor/Nursing/MHT/Case Management/Adjunct)  03/01/2012 12:33 PM  Type of Therapy:  Group Therapy  Participation Level:  Did Not Attend   Cheryl Acevedo 03/01/2012, 12:33 PM

## 2012-03-01 NOTE — [facility] Suicide Risk Assessment (Signed)
Suicide Risk Assessment  Discharge Assessment     Demographic factors:  Single White Female.  Lives with Aunt and Uncle.  Current Mental Status Per Nursing Assessment::   On Admission:    At Discharge:  AO x 3.  Patient denies any significant depressive symptoms as well as any SI/HI.  Also denies any auditory or visual hallucinations or delusional thinking.  Current Mental Status Per Physician:  Diagnosis:  Axis I:  Schizoaffective Disorder - Bipolar Type. Major Depressive Disorder - Recurrent.  Obsessive Compulsive Disorder.  Generalized Anxiety Disorder.  Axis II: Schizotypal Personality Disorder.   The patient was seen today and reports the following:   ADL's: Intact.  Sleep: The patient reports to sleeping very well last night. Appetite: The patient reports a good appetite today.   Mild>(1-10) >Severe  Hopelessness (1-10): 0  Depression (1-10): 1-2 Anxiety (1-10): 0   Suicidal Ideation: The patient adamantly denies any suicidal ideations today.  Plan: No  Intent: No  Means: No   Homicidal Ideation: The patient adamantly denies any homicidal ideations today.  Plan: No  Intent: No.  Means: No   General Appearance/Behavior: The patient remains friendly and cooperative with this provider today. Eye Contact: Good.  Speech: Appropriate in rate and volume with no pressuring noted today.  Motor Behavior: wnl.  Level of Consciousness: Alert and Oriented x 3.  Mental Status: Alert and Oriented x 3.  Mood: Appears mildly depressed.  Affect: Appears mildly constricted.  Anxiety Level: No anxiety reported or noted.  Thought Process: The patient continues to display some mild unusual thought processes but appears at her baseline. Thought Content: The patient denies any current auditory or visual hallucinations.  She continues to report display some unusual thought processes which appear to be baseline. Perception:. The patient reports some ongoing thought disturbances as  described above.  Judgment: Fair to Good.  Insight: Fair to Good.  Cognition: Oriented to person, place and time.  Sleep:  Number of Hours: 6.5    Vital Signs:Blood pressure 108/74, pulse 76, temperature 97.6 F (36.4 C), temperature source Oral, resp. rate 16, height 5\' 11"  (1.803 m), weight 68.493 kg (151 lb), last menstrual period 02/06/2012.  Current Medications: Current Facility-Administered Medications  Medication Dose Route Frequency Provider Last Rate Last Dose  . acetaminophen (TYLENOL) tablet 650 mg  650 mg Oral Q6H PRN Verne Spurr, PA-C   650 mg at 02/25/12 1151  . alum & mag hydroxide-simeth (MAALOX/MYLANTA) 200-200-20 MG/5ML suspension 30 mL  30 mL Oral Q4H PRN Verne Spurr, PA-C      . ARIPiprazole (ABILIFY) tablet 10 mg  10 mg Oral Daily Curlene Labrum Solomiya Pascale, MD   10 mg at 03/01/12 0833  . benztropine (COGENTIN) tablet 2 mg  2 mg Oral Q6H PRN Wonda Cerise, MD       Or  . benztropine mesylate (COGENTIN) injection 2 mg  2 mg Intramuscular Q6H PRN Wonda Cerise, MD      . divalproex (DEPAKOTE ER) 24 hr tablet 1,000 mg  1,000 mg Oral QHS Curlene Labrum Lanetta Figuero, MD   1,000 mg at 02/29/12 2158  . haloperidol (HALDOL) tablet 5 mg  5 mg Oral Q6H PRN Wonda Cerise, MD      . haloperidol (HALDOL) tablet 5 mg  5 mg Oral QHS Curlene Labrum Lila Lufkin, MD   5 mg at 02/29/12 2158  . hydrOXYzine (ATARAX/VISTARIL) tablet 50 mg  50 mg Oral TID PRN Wonda Cerise, MD      . magnesium hydroxide (MILK  OF MAGNESIA) suspension 30 mL  30 mL Oral Daily PRN Verne Spurr, PA-C      . sertraline (ZOLOFT) tablet 200 mg  200 mg Oral Daily Curlene Labrum Irbin Fines, MD   200 mg at 03/01/12 0833  . traZODone (DESYREL) tablet 50 mg  50 mg Oral QHS,MR X 1 Curlene Labrum Fernande Treiber, MD   50 mg at 02/29/12 2158   Lab Results:  Results for orders placed during the hospital encounter of 02/16/12 (from the past 48 hour(s))  CBC     Status: Normal   Collection Time   02/29/12  7:40 PM      Component Value Range Comment   WBC 6.0  4.0 - 10.5 (K/uL)     RBC 4.91  3.87 - 5.11 (MIL/uL)    Hemoglobin 14.1  12.0 - 15.0 (g/dL)    HCT 16.1  09.6 - 04.5 (%)    MCV 85.5  78.0 - 100.0 (fL)    MCH 28.7  26.0 - 34.0 (pg)    MCHC 33.6  30.0 - 36.0 (g/dL)    RDW 40.9  81.1 - 91.4 (%)    Platelets 202  150 - 400 (K/uL)   COMPREHENSIVE METABOLIC PANEL     Status: Abnormal   Collection Time   02/29/12  7:40 PM      Component Value Range Comment   Sodium 139  135 - 145 (mEq/L)    Potassium 4.1  3.5 - 5.1 (mEq/L)    Chloride 103  96 - 112 (mEq/L)    CO2 27  19 - 32 (mEq/L)    Glucose, Bld 104 (*) 70 - 99 (mg/dL)    BUN 12  6 - 23 (mg/dL)    Creatinine, Ser 7.82  0.50 - 1.10 (mg/dL)    Calcium 9.4  8.4 - 10.5 (mg/dL)    Total Protein 7.3  6.0 - 8.3 (g/dL)    Albumin 3.9  3.5 - 5.2 (g/dL)    AST 59 (*) 0 - 37 (U/L)    ALT 64 (*) 0 - 35 (U/L)    Alkaline Phosphatase 80  39 - 117 (U/L)    Total Bilirubin 0.1 (*) 0.3 - 1.2 (mg/dL)    GFR calc non Af Amer >90  >90 (mL/min)    GFR calc Af Amer >90  >90 (mL/min)   DIFFERENTIAL     Status: Abnormal   Collection Time   02/29/12  7:40 PM      Component Value Range Comment   Neutrophils Relative 32 (*) 43 - 77 (%)    Lymphocytes Relative 55 (*) 12 - 46 (%)    Monocytes Relative 10  3 - 12 (%)    Eosinophils Relative 1  0 - 5 (%)    Basophils Relative 2 (*) 0 - 1 (%)    Neutro Abs 1.9  1.7 - 7.7 (K/uL)    Lymphs Abs 3.3  0.7 - 4.0 (K/uL)    Monocytes Absolute 0.6  0.1 - 1.0 (K/uL)    Eosinophils Absolute 0.1  0.0 - 0.7 (K/uL)    Basophils Absolute 0.1  0.0 - 0.1 (K/uL)    WBC Morphology TOXIC GRANULATION   DOHLE BODIES  VALPROIC ACID LEVEL     Status: Normal   Collection Time   02/29/12  7:40 PM      Component Value Range Comment   Valproic Acid Lvl 77.5  50.0 - 100.0 (ug/mL)    Loss Factors: Recent breakup with boyfriend.  Historical Factors:  Family history of suicide.  Long past psychiatric history.  Risk Reduction Factors:   Very good family support.  Good access to healthcare. In good  physical health.  Continued Clinical Symptoms:  Schizoaffective Disorder - Bipolar Type. Major Depressive Disorder - Recurrent.  Obsessive Compulsive Disorder.  Generalized Anxiety Disorder.  Schizotypal Personality Disorder.  More than one psychiatric diagnosis. Past history of psychiatric care.  Discharge Diagnoses:   AXIS I:   Schizoaffective Disorder - Bipolar Type.   Major Depressive Disorder - Recurrent.    Obsessive Compulsive Disorder.    Generalized Anxiety Disorder.  AXIS II:   Schizotypal Personality Disorder. AXIS III:  1.  No Acute Illnesses Noted.  AXIS IV:   Some family discord.  Recent breakup with boyfriend. AXIS V:   GAF at time of admission approximately 35.  GAF at time of discharge approximately 60.  Cognitive Features That Contribute To Risk:  Thought constriction (tunnel vision)    Time was spent today discussing with the patient her current symptoms.  The patient reports to sleeping well without difficulty.  She reports a good appetite and reports some mild feelings of sadness, anhedonia and depressed mood.  She adamantly denies any SI/HI.  The patient also denies any auditory or visual hallucinations or delusional thinking.  She continues to display some unusual thought processes today which appears to be at baseline.  The patient is requesting discharge today and this will be ordered.  Treatment Plan Summary:  1. Daily contact with patient to assess and evaluate symptoms and progress in treatment.  2. Medication management  3. The patient will deny suicidal ideations or homicidal ideations for 48 hours prior to discharge and have a depression and anxiety rating of 3 or less. The patient will also deny any auditory or visual hallucinations or delusional thinking.  4. The patient will deny any symptoms of substance withdrawal at time of discharge.   Plan:  1. Will continue the patient on her current medications.  2. Laboratory studies reviewed.  3. Will  continue to monitor.  4. Will discharge today to outpatient follow up with [facility] this week.  Suicide Risk:  Minimal: No identifiable suicidal ideation.  Patients presenting with no risk factors but with morbid ruminations; may be classified as minimal risk based on the severity of the depressive symptoms  Plan Of Care/Follow-up recommendations:  Activity:  As tolerated. Diet:  Regular. Tests:  The patient needs to have repeat liver enymes obtained this week.  Her last enzymes were slightly elevated. Other:  Please keep all scheduled follow up appoinments and take all medications only as directed.  Note:  It is VERY IMPORTANT that the liver enzymes be rechecked.  Khristen Cheyney 03/01/2012, 12:02 PM

## 2012-03-01 NOTE — Discharge Summary (Signed)
Physician Discharge Summary Note  Patient:  Cheryl Acevedo is an 21 y.o., female MRN:  [id number] DOB:  11-29-1990 Patient phone:  [phone] (home)  Patient address:   [address] [address] [place] [address],   Date of Admission:  02/16/2012 Date of Discharge: 03/01/2012  Reason for Admission: Increasing depression with dark thoughts  Discharge Diagnoses: Principal Problem:  *Schizoaffective disorder, bipolar type Active Problems:  Major depressive disorder, recurrent  OCD (obsessive compulsive disorder)  Generalized anxiety disorder   Axis Diagnosis:  Discharge Diagnoses:  AXIS I: Schizoaffective Disorder - Bipolar Type.  Major Depressive Disorder - Recurrent.  Obsessive Compulsive Disorder.  Generalized Anxiety Disorder.  AXIS II: Schizotypal Personality Disorder.  AXIS III: 1. No Acute Illnesses Noted.  AXIS IV: Some family discord. Recent breakup with boyfriend.  AXIS V: GAF at time of admission approximately 35. GAF at time of discharge approximately 60.   Level of Care:  OP  Hospital Course:  Fleeta was as admitted for crisis management and stabilization. She was treated with Abilify, Depaote, Haldol, Zoloft, and Trazodone, generic forms when available. Her medical problems were identified and treated.  Home medication was restarted as appropriate.     Improvement was monitored by patient's daily self inventory of her emotional and mental status, clinical input from providers, her attendance and participation in individual and group programming.  Her mother was also actively involved in Akesha's recovery and spoke to the staff on several occasions voicing her concerns and offering feedback when appropriate and with Neisha's permission.     The patient was evaluated by the treatment team for stability and plans for continued recovery upon discharge. She was offered further treatment options upon discharge including Residential, IOP, and Outpatient treatment.  The patient's  motivation was an integral factor for scheduling further treatment.  Employment, transportation, bed availability, health status, family support, and any pending legal issues were also considered.  The patient elected to participate in out patient therapy and will follow up with Dr. Ladona Ridgel.    Upon discharge the patient was both mentally and medically stable for discharge.    Consults: None  Significant Diagnostic Studies:  Routine labs  Discharge Vitals:   Blood pressure 108/74, pulse 76, temperature 97.6 F (36.4 C), temperature source Oral, resp. rate 16, height 5\' 11"  (1.803 m), weight 68.493 kg (151 lb), last menstrual period 02/06/2012.  Mental Status Exam: See Mental Status Examination and Suicide Risk Assessment completed by Attending Physician prior to discharge.  Discharge destination:  home  Is patient on multiple antipsychotic therapies at discharge:  She is on two antipsychotic medications. Ariprazole which she has been on for several years and a low dose Haldol was added for adjuvant therapy and sleep.   Has Patient had three or more failed trials of antipsychotic monotherapy by history:  No  Recommended Plan for Multiple Antipsychotic Therapies: Not applicable at this time.   Discharge Orders    Future Orders Please Complete By Expires   Diet - low sodium heart healthy      Increase activity slowly      Discharge instructions      Comments:   Take all medication as prescribed.  Follow up with your Primary Care provider or your psychiatrist as scheduled.     Medication List  As of 03/01/2012 10:36 AM   STOP taking these medications         atomoxetine 25 MG capsule  For ADD      dextromethorphan-guaiFENesin 30-600 MG per  12 hr tablet for cough         TAKE these medications      Indication    ARIPiprazole 10 MG tablet   Commonly known as: ABILIFY   Take 1 tablet (10 mg total) by mouth daily. For mood stabilization and psychosis and depression.        divalproex 500 MG 24 hr tablet   Commonly known as: DEPAKOTE ER   Take 2 tablets (1,000 mg total) by mouth at bedtime. For mood stabilization.       haloperidol 2 MG tablet   Commonly known as: HALDOL   Take 1 tablet (2 mg total) by mouth at bedtime. For psychosis and mental clarity.       haloperidol 5 MG tablet   Commonly known as: HALDOL   Take 1 tablet (5 mg total) by mouth at bedtime. For depression and mental clarity.       sertraline 100 MG tablet   Commonly known as: ZOLOFT   Take 2 tablets (200 mg total) by mouth daily. For anxiety and depression.       traZODone 150 MG tablet   Commonly known as: DESYREL   Take 1 tablet (150 mg total) by mouth at bedtime and may repeat dose one time if needed. For sleep.       traZODone 50 MG tablet   Commonly known as: DESYREL   Take 1 tablet (50 mg total) by mouth at bedtime and may repeat dose one time if needed. For sleep.            Follow-up Information    Follow up with Dr. Ladona Ridgel. Schedule an appointment as soon as possible for a visit on 03/02/2012. [facility] is open 8AM-3PM;  Please go Tuesday 4/30 or Wednesday 5/1. for a hospital discharge visit.)    Contact information:   [facility] [address]  [address] Telephone:  [phone]         Follow-up recommendations:  Activity as tolerated, heart healthy diet, keep follow up appointments.  Comments:  None  Signed: Lloyd Huger T. [place] Straus PAC For Dr. Harvie Heck D. Readling 03/01/2012, 10:36 AM12

## 2012-03-01 NOTE — Tx Team (Signed)
Interdisciplinary Treatment Plan Update (Adult)  Date:  03/01/2012  Time Reviewed:  10:15AM-11:15AM  Progress in Treatment: Attending groups:  Yes Participating in groups:    Yes Taking medication as prescribed:    Yes Tolerating medication:   Yes Family/Significant other contact made:  Yes Patient understands diagnosis:   Yes, limited insight Discussing patient identified problems/goals with staff:   Yes Medical problems stabilized or resolved:   Yes Denies suicidal/homicidal ideation:  Yes Issues/concerns per patient self-inventory:   None Other:    New problem(s) identified: No, Describe:  \  Reason for Continuation of Hospitalization: None  Interventions implemented related to continuation of hospitalization:  Medication monitoring and adjustment, safety checks Q15 min., suicide risk assessment, group therapy, psychoeducation, collateral contact, aftercare planning, ongoing physician assessments, medication education  Additional comments:  Not applicable  Estimated length of stay:  Discharge today  Discharge Plan:  Go home to live with mother, follow up with [facility]  New goal(s):  Not applicable  Review of initial/current patient goals per problem list:   1.  Goal(s):  Reduce paranoia to baseline per patient and family.  Met:  Yes  Target date:  By Discharge   As evidenced by:  Still has some paranoia but will continue to get better at home. This was discussed at length with her mother, who agrees that patient will continue to get better when no longer on the unit exposed to others' paranoid systems  2.  Goal(s):  Reduce anxiety from admission level to no greater than 3 at discharge.  Met:  Yes  Target date:  By Discharge   As evidenced by:  "0" today  3.  Goal(s):  Determine aftercare placement.  Met:  Yes  Target date:  By Discharge   As evidenced by:  Will live with mother  4.  Goal(s):  Medication stabilization so OCD symptoms under control.  Met:   Yes  Target date:  By Discharge   As evidenced by:  Symptoms are better   5. Goal(s): Reduce racing thoughts to baseline.  Met: Yes  Target date: By Discharge  As evidenced by: Stable, under control  6. Goal(s): Return to normal sleep pattern of 6+ hrs per night.  Met: Yes  Target date: By Discharge  As evidenced by: Has been having more than 6 hours nightly  5. Goal(s): Deny Auditory and Visual hallucinations and delusions.  Met: Yes  Target date: By Discharge  As evidenced by: Denies all  Attendees: Patient:  Cheryl Acevedo  03/01/2012 10:15AM-11:15AM  Family:     Physician:  Dr. Harvie Heck Readling 03/01/2012 10:15AM-11:15AM  Nursing:   Neill Loft, RN 03/01/2012 10:15AM -11:15AM   Case Manager:  Ambrose Mantle, LCSW 03/01/2012 10:15AM-11:15AM  Counselor:  Veto Kemps, MT-BC 03/01/2012 10:15AM-11:15AM  Other:   Verne Spurr, PA 03/01/2012 10:15AM-11:15AM  Other:      Other:      Other:       Scribe for Treatment Team:   Sarina Ser, 03/01/2012, 10:15AM-11:15AM

## 2012-03-03 NOTE — Progress Notes (Signed)
Patient Discharge Instructions:  Psychiatric Admission Assessment Note Provided,  03/02/2012 After Visit Summary (AVS) Provided,  03/02/2012 Face Sheet Provided, 03/02/2012 Faxed/Sent to the Next Level Care provider:  03/02/2012 Provided Suicide Risk Assessment - Discharge Assessment 03/02/2012  Faxed to [facility] - Dr. Ladona Ridgel @ [phone]  Wandra Scot, 03/03/2012, 4:31 PM

## 2012-03-22 ENCOUNTER — Emergency Department (HOSPITAL_COMMUNITY)
Admission: EM | Admit: 2012-03-22 | Discharge: 2012-03-22 | Discharge status: Against Medical Advice | Payer: Managed Care, Other (non HMO) | Attending: Emergency Medicine | Admitting: Emergency Medicine

## 2012-03-22 DIAGNOSIS — F411 Generalized anxiety disorder: Secondary | ICD-10-CM | POA: Insufficient documentation

## 2012-03-22 DIAGNOSIS — R209 Unspecified disturbances of skin sensation: Secondary | ICD-10-CM | POA: Insufficient documentation

## 2012-03-22 NOTE — ED Notes (Signed)
On arrival, crying, stating lips numb, at present, calm, lips no longer numb. Alert oriented X3.

## 2012-03-22 NOTE — ED Notes (Signed)
Was in [facility] two weeks ago and had meds re-adjusted three days ago.

## 2012-04-01 ENCOUNTER — Emergency Department (HOSPITAL_COMMUNITY)
Admission: EM | Admit: 2012-04-01 | Discharge: 2012-04-01 | Discharge status: Against Medical Advice | Payer: Managed Care, Other (non HMO) | Attending: Emergency Medicine | Admitting: Emergency Medicine

## 2012-04-01 ENCOUNTER — Encounter (HOSPITAL_COMMUNITY): Payer: Self-pay

## 2012-04-01 DIAGNOSIS — R209 Unspecified disturbances of skin sensation: Secondary | ICD-10-CM | POA: Insufficient documentation

## 2012-04-01 NOTE — ED Notes (Signed)
Pt denies any suicidal ideations at present, reports compliance with medication given with less depression.
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
## 2014-01-03 ENCOUNTER — Emergency Department (HOSPITAL_COMMUNITY)
Admission: EM | Admit: 2014-01-03 | Discharge: 2014-01-03 | Disposition: A | Discharge status: Home / Self Care | Payer: Managed Care, Other (non HMO) | Attending: Emergency Medicine | Admitting: Emergency Medicine

## 2014-01-03 ENCOUNTER — Encounter (HOSPITAL_COMMUNITY): Payer: Self-pay | Admitting: Emergency Medicine

## 2014-01-03 DIAGNOSIS — Z79899 Other long term (current) drug therapy: Secondary | ICD-10-CM | POA: Insufficient documentation

## 2014-01-03 DIAGNOSIS — R51 Headache: Secondary | ICD-10-CM

## 2014-01-03 DIAGNOSIS — F411 Generalized anxiety disorder: Secondary | ICD-10-CM | POA: Insufficient documentation

## 2014-01-03 DIAGNOSIS — S0990XA Unspecified injury of head, initial encounter: Secondary | ICD-10-CM | POA: Insufficient documentation

## 2014-01-03 DIAGNOSIS — Y92410 Unspecified street and highway as the place of occurrence of the external cause: Secondary | ICD-10-CM | POA: Insufficient documentation

## 2014-01-03 DIAGNOSIS — S0993XA Unspecified injury of face, initial encounter: Secondary | ICD-10-CM | POA: Insufficient documentation

## 2014-01-03 DIAGNOSIS — Y9389 Activity, other specified: Secondary | ICD-10-CM | POA: Insufficient documentation

## 2014-01-03 DIAGNOSIS — S199XXA Unspecified injury of neck, initial encounter: Secondary | ICD-10-CM

## 2014-01-03 DIAGNOSIS — R519 Headache, unspecified: Secondary | ICD-10-CM

## 2014-01-03 MED ORDER — TRAMADOL HCL 50 MG PO TABS: 50.0000 mg | ORAL_TABLET | Freq: Four times a day (QID) | ORAL | Status: DC | PRN

## 2014-01-03 MED ORDER — IBUPROFEN 600 MG PO TABS: 600.0000 mg | ORAL_TABLET | Freq: Four times a day (QID) | ORAL | Status: DC | PRN
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
## 2014-01-03 NOTE — ED Notes (Signed)
Pt observed ambulating to the room from triage without any difficulty. 
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
## 2014-01-11 NOTE — ED Provider Notes (Signed)
I personally performed the services described in this documentation, which was scribed in my presence. The recorded information has been reviewed and is accurate.   Rolland PorterMark Derrall Hicks, MD 01/11/14 [phone]

## 2014-09-18 ENCOUNTER — Emergency Department (HOSPITAL_COMMUNITY)
Admission: EM | Admit: 2014-09-18 | Discharge: 2014-09-18 | Disposition: A | Discharge status: Home / Self Care | Payer: Managed Care, Other (non HMO) | Attending: Emergency Medicine | Admitting: Emergency Medicine

## 2014-09-18 ENCOUNTER — Encounter (HOSPITAL_COMMUNITY): Payer: Self-pay | Admitting: Emergency Medicine

## 2014-09-18 DIAGNOSIS — J0100 Acute maxillary sinusitis, unspecified: Secondary | ICD-10-CM | POA: Insufficient documentation

## 2014-09-18 DIAGNOSIS — H9202 Otalgia, left ear: Secondary | ICD-10-CM | POA: Insufficient documentation

## 2014-09-18 DIAGNOSIS — J029 Acute pharyngitis, unspecified: Secondary | ICD-10-CM | POA: Insufficient documentation

## 2014-09-18 DIAGNOSIS — F419 Anxiety disorder, unspecified: Secondary | ICD-10-CM

## 2014-09-18 DIAGNOSIS — F319 Bipolar disorder, unspecified: Secondary | ICD-10-CM | POA: Diagnosis not present

## 2014-09-18 DIAGNOSIS — Z79899 Other long term (current) drug therapy: Secondary | ICD-10-CM | POA: Diagnosis not present

## 2014-09-18 DIAGNOSIS — F200 Paranoid schizophrenia: Secondary | ICD-10-CM | POA: Insufficient documentation

## 2014-09-18 DIAGNOSIS — F42 Obsessive-compulsive disorder: Secondary | ICD-10-CM | POA: Diagnosis not present

## 2014-09-18 HISTORY — DX: Bipolar disorder, unspecified: F31.9

## 2014-09-18 MED ORDER — AMOXICILLIN 500 MG PO CAPS: 1000.0000 mg | ORAL_CAPSULE | Freq: Two times a day (BID) | ORAL | Status: DC

## 2014-09-18 NOTE — ED Notes (Signed)
Pt ambulatory upon DC. She was advised to follow up 3-4 days for a recheck. She reports she is leaving with ALL belongings she arrived with.

## 2014-09-18 NOTE — ED Notes (Signed)
Pt states she has an earache on the left and states she feels like it may be infected  Pt states she is also having problems with anxiety  Pt states she had an anxiety attack last night and she has not had one in a really long time and wants to talk to a dr regarding her medications  Pt states she wants her nose checked too as she feels like she has congestion

## 2014-09-18 NOTE — ED Provider Notes (Signed)
CSN: [id number]     Arrival date & time 09/18/14  1935 History   First MD Initiated Contact with Patient 09/18/14 2055     Chief Complaint  Patient presents with  . Otalgia  . Anxiety   Patient is a 23 y.o. female presenting with ear pain and anxiety. The history is provided by the patient. No language interpreter was used.  Otalgia Associated symptoms: congestion and sore throat   Associated symptoms: no vomiting   Anxiety  This chart was scribed for non-physician practitioner, Elpidio AnisShari Inaara Tye, PA-C working with Mirian MoMatthew Gentry, MD, by Andrew Auaven Small, ED Scribe. This patient was seen in room WTR4/WLPT4 and the patient's care was started at 10:07 PM.  Cheryl Acevedo is a 23 y.o. female who presents to the Emergency Department complaining of sinus congestion with associated postnasal drip, bilateral sore throat, and left otalgia that began about 1 week ago.  Pt reports her ear has been "popping" and believes ear may be infected. Pt denies recent infection. Pt denies nausea and emesis.  Pt also c/o anxiety. Pt states she has been taking vyvanse for ADD. She states she had a 30 min panic attack last night, feeling as if she couldn't breath and losing the sense of where she was at. Pt states she is not supposed to drink but had 1 beer last night which she believes she triggered the attack. Pt reports she was calmed down by another person who encouraged deep breaths. She reports she was in behavioral health about a year ago for instability but now feels stable.   Past Medical History  Diagnosis Date  . Obsessive compulsive disorder   . Anxiety   . Paranoia   . Bipolar disorder    Past Surgical History  Procedure Laterality Date  . Wisdom tooth extraction Bilateral 2000    x4  . Cosmetic surgery     Family History  Problem Relation Age of Onset  . Cancer Mother   . Diabetes Other   . Cancer Other    History  Substance Use Topics  . Smoking status: Never Smoker   . Smokeless tobacco:  Never Used  . Alcohol Use: Yes     Comment: Occasional   OB History    No data available     Review of Systems  HENT: Positive for congestion, ear pain, sinus pressure and sore throat.   Gastrointestinal: Negative for nausea and vomiting.  Psychiatric/Behavioral: The patient is nervous/anxious.     Allergies  Review of patient's allergies indicates no known allergies.  Home Medications   Prior to Admission medications   Medication Sig Start Date End Date Taking? Authorizing Provider  ARIPiprazole (ABILIFY MAINTENA) 400 MG SUSR Inject into the muscle every 30 (thirty) days.   Yes Historical Provider, MD  BIOTIN PO Take 1 tablet by mouth daily.   Yes Historical Provider, MD  ibuprofen (ADVIL,MOTRIN) 600 MG tablet Take 1 tablet (600 mg total) by mouth every 6 (six) hours as needed. 01/03/14  Yes Junius FinnerErin O'Malley, PA-C  lisdexamfetamine (VYVANSE) 30 MG capsule Take 30 mg by mouth daily.   Yes Historical Provider, MD  Phenyleph-Doxylamine-DM-APAP (ALKA SELTZER PLUS PO) Take 1 packet by mouth daily as needed (cold symptoms).   Yes Historical Provider, MD  sertraline (ZOLOFT) 100 MG tablet Take 100 mg by mouth daily.   Yes Historical Provider, MD  haloperidol (HALDOL) 5 MG tablet Take 1 tablet (5 mg total) by mouth at bedtime. For depression and mental clarity. 03/01/12 03/31/12  Verne SpurrNeil Mashburn, PA-C  sertraline (ZOLOFT) 100 MG tablet Take 2 tablets (200 mg total) by mouth daily. For anxiety and depression. 02/27/12 02/26/13  Verne SpurrNeil Mashburn, PA-C  traMADol (ULTRAM) 50 MG tablet Take 1 tablet (50 mg total) by mouth every 6 (six) hours as needed. 01/03/14   Junius FinnerErin O'Malley, PA-C  traZODone (DESYREL) 50 MG tablet Take 1 tablet (50 mg total) by mouth at bedtime and may repeat dose one time if needed. For sleep. 03/01/12 03/31/12  Verne SpurrNeil Mashburn, PA-C   BP 116/66 mmHg  Pulse 84  Temp(Src) 98.2 F (36.8 C) (Oral)  Resp 18  SpO2 99%  LMP 08/20/2014 (Approximate) Physical Exam  Constitutional: She is oriented  to person, place, and time. She appears well-developed and well-nourished. No distress.  HENT:  Head: Normocephalic and atraumatic.  Right Ear: Hearing, tympanic membrane, external ear and ear canal normal.  Left Ear: Hearing, tympanic membrane, external ear and ear canal normal.  Nose: No mucosal edema. Right sinus exhibits maxillary sinus tenderness ( mild). Left sinus exhibits maxillary sinus tenderness ( mild).  Mouth/Throat: Uvula is midline. No oropharyngeal exudate, posterior oropharyngeal edema or posterior oropharyngeal erythema.  TM's clear bilaterally.   Eyes: Conjunctivae and EOM are normal.  Neck: Neck supple.  Cardiovascular: Normal rate.   Pulmonary/Chest: Effort normal.  Musculoskeletal: Normal range of motion.  Lymphadenopathy:    She has no cervical adenopathy.  Neurological: She is alert and oriented to person, place, and time.  Skin: Skin is warm and dry.  Psychiatric: She has a normal mood and affect. Her behavior is normal.  Nursing note and vitals reviewed.   ED Course  Procedures  DIAGNOSTIC STUDIES: Oxygen Saturation is 99% on RA, norma; by my interpretation.    COORDINATION OF CARE: 10:17 PM- Pt advised of plan for treatment and pt agrees.  Labs Review Labs Reviewed - No data to display  Imaging Review No results found.   EKG Interpretation None      MDM   Final diagnoses:  None  1. Sinusitis 2. Otalgia 3. Anxiety  She is well appearing here without symptoms of anxiety. She describes episode last evening of panic, lasting 30 minutes without recurrence with history of same. [facility] her routine treatment for psychiatric illness (schoizoaffective). She seems stable from this standpoint.  Will treat sinus tenderness with abx. Refer to PCP for persistent symptoms.   I personally performed the services described in this documentation, which was scribed in my presence. The recorded information has been  reviewed and is accurate.      Arnoldo HookerShari A Yarlin Breisch, PA-C 09/19/14 [id number]  Mirian MoMatthew Gentry, MD 09/20/14 [phone]

## 2014-09-18 NOTE — Discharge Instructions (Signed)
Generalized Anxiety Disorder Generalized anxiety disorder (GAD) is a mental disorder. It interferes with life functions, including relationships, work, and school. GAD is different from normal anxiety, which everyone experiences at some point in their lives in response to specific life events and activities. Normal anxiety actually helps us prepare for and get through these life events and activities. Normal anxiety goes away after the event or activity is over.  GAD causes anxiety that is not necessarily related to specific events or activities. It also causes excess anxiety in proportion to specific events or activities. The anxiety associated with GAD is also difficult to control. GAD can vary from mild to severe. People with severe GAD can have intense waves of anxiety with physical symptoms (panic attacks).  SYMPTOMS The anxiety and worry associated with GAD are difficult to control. This anxiety and worry are related to many life events and activities and also occur more days than not for 6 months or longer. People with GAD also have three or more of the following symptoms (one or more in children):  Restlessness.   Fatigue.  Difficulty concentrating.   Irritability.  Muscle tension.  Difficulty sleeping or unsatisfying sleep. DIAGNOSIS GAD is diagnosed through an assessment by your health care provider. Your health care provider will ask you questions aboutyour mood,physical symptoms, and events in your life. Your health care provider may ask you about your medical history and use of alcohol or drugs, including prescription medicines. Your health care provider may also do a physical exam and blood tests. Certain medical conditions and the use of certain substances can cause symptoms similar to those associated with GAD. Your health care provider may refer you to a mental health specialist for further evaluation. TREATMENT The following therapies are usually used to treat GAD:    Medication. Antidepressant medication usually is prescribed for long-term daily control. Antianxiety medicines may be added in severe cases, especially when panic attacks occur.   Talk therapy (psychotherapy). Certain types of talk therapy can be helpful in treating GAD by providing support, education, and guidance. A form of talk therapy called cognitive behavioral therapy can teach you healthy ways to think about and react to daily life events and activities.  Stress managementtechniques. These include yoga, meditation, and exercise and can be very helpful when they are practiced regularly. A mental health specialist can help determine which treatment is best for you. Some people see improvement with one therapy. However, other people require a combination of therapies. Document Released: 02/14/2013 Document Revised: 03/06/2014 Document Reviewed: 02/14/2013 [facility] Patient Information 2015 [place], [place]. This information is not intended to replace advice given to you by your health care provider. Make sure you discuss any questions you have with your health care provider. Sinusitis Sinusitis is redness, soreness, and inflammation of the paranasal sinuses. Paranasal sinuses are air pockets within the bones of your face (beneath the eyes, the middle of the forehead, or above the eyes). In healthy paranasal sinuses, mucus is able to drain out, and air is able to circulate through them by way of your nose. However, when your paranasal sinuses are inflamed, mucus and air can become trapped. This can allow bacteria and other germs to grow and cause infection. Sinusitis can develop quickly and last only a short time (acute) or continue over a long period (chronic). Sinusitis that lasts for more than 12 weeks is considered chronic.  CAUSES  Causes of sinusitis include:  Allergies.  Structural abnormalities, such as displacement of the cartilage that  separates your nostrils (deviated septum), which  can decrease the air flow through your nose and sinuses and affect sinus drainage.  Functional abnormalities, such as when the small hairs (cilia) that line your sinuses and help remove mucus do not work properly or are not present. SIGNS AND SYMPTOMS  Symptoms of acute and chronic sinusitis are the same. The primary symptoms are pain and pressure around the affected sinuses. Other symptoms include:  Upper toothache.  Earache.  Headache.  Bad breath.  Decreased sense of smell and taste.  A cough, which worsens when you are lying flat.  Fatigue.  Fever.  Thick drainage from your nose, which often is green and may contain pus (purulent).  Swelling and warmth over the affected sinuses. DIAGNOSIS  Your health care provider will perform a physical exam. During the exam, your health care provider may:  Look in your nose for signs of abnormal growths in your nostrils (nasal polyps).  Tap over the affected sinus to check for signs of infection.  View the inside of your sinuses (endoscopy) using an imaging device that has a light attached (endoscope). If your health care provider suspects that you have chronic sinusitis, one or more of the following tests may be recommended:  Allergy tests.  Nasal culture. A sample of mucus is taken from your nose, sent to a lab, and screened for bacteria.  Nasal cytology. A sample of mucus is taken from your nose and examined by your health care provider to determine if your sinusitis is related to an allergy. TREATMENT  Most cases of acute sinusitis are related to a viral infection and will resolve on their own within 10 days. Sometimes medicines are prescribed to help relieve symptoms (pain medicine, decongestants, nasal steroid sprays, or saline sprays).  However, for sinusitis related to a bacterial infection, your health care provider will prescribe antibiotic medicines. These are medicines that will help kill the bacteria causing the  infection.  Rarely, sinusitis is caused by a fungal infection. In theses cases, your health care provider will prescribe antifungal medicine. For some cases of chronic sinusitis, surgery is needed. Generally, these are cases in which sinusitis recurs more than 3 times per year, despite other treatments. HOME CARE INSTRUCTIONS   Drink plenty of water. Water helps thin the mucus so your sinuses can drain more easily.  Use a humidifier.  Inhale steam 3 to 4 times a day (for example, sit in the bathroom with the shower running).  Apply a warm, moist washcloth to your face 3 to 4 times a day, or as directed by your health care provider.  Use saline nasal sprays to help moisten and clean your sinuses.  Take medicines only as directed by your health care provider.  If you were prescribed either an antibiotic or antifungal medicine, finish it all even if you start to feel better. SEEK IMMEDIATE MEDICAL CARE IF:  You have increasing pain or severe headaches.  You have nausea, vomiting, or drowsiness.  You have swelling around your face.  You have vision problems.  You have a stiff neck.  You have difficulty breathing. MAKE SURE YOU:   Understand these instructions.  Will watch your condition.  Will get help right away if you are not doing well or get worse. Document Released: 10/20/2005 Document Revised: 03/06/2014 Document Reviewed: 11/04/2011 [facility] Patient Information 2015 [place], [place]. This information is not intended to replace advice given to you by your health care provider. Make sure you discuss any questions you  have with your health care provider. ° °

## 2014-11-22 ENCOUNTER — Other Ambulatory Visit: Payer: Self-pay | Admitting: Obstetrics and Gynecology

## 2014-11-22 DIAGNOSIS — N6315 Unspecified lump in the right breast, overlapping quadrants: Secondary | ICD-10-CM

## 2014-11-22 DIAGNOSIS — Z803 Family history of malignant neoplasm of breast: Secondary | ICD-10-CM

## 2014-11-22 DIAGNOSIS — N6310 Unspecified lump in the right breast, unspecified quadrant: Principal | ICD-10-CM

## 2014-11-30 ENCOUNTER — Ambulatory Visit
Admission: RE | Admit: 2014-11-30 | Discharge: 2014-11-30 | Disposition: A | Discharge status: Home / Self Care | Payer: Managed Care, Other (non HMO) | Source: Ambulatory Visit | Attending: Obstetrics and Gynecology | Admitting: Obstetrics and Gynecology

## 2014-11-30 DIAGNOSIS — N6315 Unspecified lump in the right breast, overlapping quadrants: Secondary | ICD-10-CM

## 2014-11-30 DIAGNOSIS — Z803 Family history of malignant neoplasm of breast: Secondary | ICD-10-CM

## 2014-11-30 DIAGNOSIS — N6310 Unspecified lump in the right breast, unspecified quadrant: Principal | ICD-10-CM

## 2015-05-04 ENCOUNTER — Other Ambulatory Visit: Payer: Self-pay | Admitting: Obstetrics and Gynecology

## 2015-05-04 DIAGNOSIS — N6310 Unspecified lump in the right breast, unspecified quadrant: Secondary | ICD-10-CM

## 2015-06-01 ENCOUNTER — Other Ambulatory Visit: Payer: Self-pay | Admitting: Obstetrics and Gynecology

## 2015-06-01 ENCOUNTER — Ambulatory Visit
Admission: RE | Admit: 2015-06-01 | Discharge: 2015-06-01 | Disposition: A | Discharge status: Home / Self Care | Payer: Managed Care, Other (non HMO) | Source: Ambulatory Visit | Attending: Obstetrics and Gynecology | Admitting: Obstetrics and Gynecology

## 2015-06-01 DIAGNOSIS — N6310 Unspecified lump in the right breast, unspecified quadrant: Secondary | ICD-10-CM

## 2015-06-07 ENCOUNTER — Inpatient Hospital Stay: Admission: RE | Admit: 2015-06-07 | Payer: Managed Care, Other (non HMO) | Source: Ambulatory Visit

## 2015-11-10 ENCOUNTER — Emergency Department (HOSPITAL_COMMUNITY)
Admission: EM | Admit: 2015-11-10 | Discharge: 2015-11-10 | Disposition: A | Discharge status: Home / Self Care | Payer: Managed Care, Other (non HMO) | Attending: Emergency Medicine | Admitting: Emergency Medicine

## 2015-11-10 ENCOUNTER — Encounter (HOSPITAL_COMMUNITY): Payer: Self-pay | Admitting: *Deleted

## 2015-11-10 DIAGNOSIS — Y9389 Activity, other specified: Secondary | ICD-10-CM | POA: Insufficient documentation

## 2015-11-10 DIAGNOSIS — W1839XA Other fall on same level, initial encounter: Secondary | ICD-10-CM | POA: Diagnosis not present

## 2015-11-10 DIAGNOSIS — F319 Bipolar disorder, unspecified: Secondary | ICD-10-CM | POA: Diagnosis not present

## 2015-11-10 DIAGNOSIS — Y998 Other external cause status: Secondary | ICD-10-CM | POA: Insufficient documentation

## 2015-11-10 DIAGNOSIS — S0181XA Laceration without foreign body of other part of head, initial encounter: Secondary | ICD-10-CM | POA: Diagnosis not present

## 2015-11-10 DIAGNOSIS — Y9289 Other specified places as the place of occurrence of the external cause: Secondary | ICD-10-CM | POA: Diagnosis not present

## 2015-11-10 DIAGNOSIS — Z792 Long term (current) use of antibiotics: Secondary | ICD-10-CM | POA: Diagnosis not present

## 2015-11-10 DIAGNOSIS — Z79899 Other long term (current) drug therapy: Secondary | ICD-10-CM | POA: Insufficient documentation

## 2015-11-10 DIAGNOSIS — F419 Anxiety disorder, unspecified: Secondary | ICD-10-CM | POA: Insufficient documentation

## 2015-11-10 DIAGNOSIS — F429 Obsessive-compulsive disorder, unspecified: Secondary | ICD-10-CM | POA: Insufficient documentation

## 2015-11-10 DIAGNOSIS — S0993XA Unspecified injury of face, initial encounter: Secondary | ICD-10-CM | POA: Diagnosis present

## 2015-11-10 DIAGNOSIS — IMO0002 Reserved for concepts with insufficient information to code with codable children: Secondary | ICD-10-CM

## 2015-11-10 MED ORDER — LIDOCAINE-EPINEPHRINE (PF) 2 %-1:200000 IJ SOLN
10.0000 mL | Freq: Once | INTRAMUSCULAR | Status: AC
  Administered 2015-11-10: 10 mL
  Filled 2015-11-10: qty 10

## 2015-11-10 NOTE — Discharge Instructions (Signed)
Follow-up with this department in 5-7 days for suture removal. May wash with soap and water. Apply antibacterial ointment to affected area. Return to the emergency department if you experience severe increase in your pain, redness or swelling around the affected area, drainage from the wound, fever.

## 2015-11-10 NOTE — ED Provider Notes (Signed)
CSN: [id number]     Arrival date & time 11/10/15  1920 History  By signing my name below, I, Cheryl Acevedo, attest that this documentation has been prepared under the direction and in the presence of AvayaSamantha Rada Zegers, PA-C. Electronically Signed: Lyndel SafeKaitlyn Acevedo, ED Scribe. 11/10/2015. 7:38 PM.   Chief Complaint  Patient presents with  . Laceration   The history is provided by the patient. No language interpreter was used.   HPI Comments: Cheryl Acevedo is a 25 y.o. female, with no pertinent PMhx, who presents to the Emergency Department complaining of a laceration sustained to her chin 2 hours ago s/p falling and clipping her chin on a horse's hoof. No meds taken PTA. Pt denies LOC, other trauma or injury, malocclusion, or dental injury, loose teeth, mandibular pain. Pt had her tetanus booster last week.  Past Medical History  Diagnosis Date  . Obsessive compulsive disorder   . Anxiety   . Paranoia (HCC)   . Bipolar disorder [facility](HCC)    Past Surgical History  Procedure Laterality Date  . Wisdom tooth extraction Bilateral 2000    x4  . Cosmetic surgery     Family History  Problem Relation Age of Onset  . Cancer Mother   . Diabetes Other   . Cancer Other    Social History  Substance Use Topics  . Smoking status: Never Smoker   . Smokeless tobacco: Never Used  . Alcohol Use: Yes     Comment: Occasional   OB History    No data available     Review of Systems  HENT: Negative for dental problem.   Skin: Positive for wound ( laceration to chin).  All other systems reviewed and are negative.  Allergies  Review of patient's allergies indicates no known allergies.  Home Medications   Prior to Admission medications   Medication Sig Start Date End Date Taking? Authorizing Provider  amoxicillin (AMOXIL) 500 MG capsule Take 2 capsules (1,000 mg total) by mouth 2 (two) times daily. 09/18/14   Elpidio AnisShari Upstill, PA-C  ARIPiprazole (ABILIFY MAINTENA) 400 MG SUSR Inject into the  muscle every 30 (thirty) days.    Historical Provider, MD  BIOTIN PO Take 1 tablet by mouth daily.    Historical Provider, MD  haloperidol (HALDOL) 5 MG tablet Take 1 tablet (5 mg total) by mouth at bedtime. For depression and mental clarity. 03/01/12 03/31/12  Tamala JulianNeil T Mashburn, PA-C  ibuprofen (ADVIL,MOTRIN) 600 MG tablet Take 1 tablet (600 mg total) by mouth every 6 (six) hours as needed. 01/03/14   Junius FinnerErin O'Malley, PA-C  lisdexamfetamine (VYVANSE) 30 MG capsule Take 30 mg by mouth daily.    Historical Provider, MD  Phenyleph-Doxylamine-DM-APAP (ALKA SELTZER PLUS PO) Take 1 packet by mouth daily as needed (cold symptoms).    Historical Provider, MD  sertraline (ZOLOFT) 100 MG tablet Take 2 tablets (200 mg total) by mouth daily. For anxiety and depression. 02/27/12 02/26/13  Tamala JulianNeil T Mashburn, PA-C  sertraline (ZOLOFT) 100 MG tablet Take 100 mg by mouth daily.    Historical Provider, MD  traMADol (ULTRAM) 50 MG tablet Take 1 tablet (50 mg total) by mouth every 6 (six) hours as needed. 01/03/14   Junius FinnerErin O'Malley, PA-C  traZODone (DESYREL) 50 MG tablet Take 1 tablet (50 mg total) by mouth at bedtime and may repeat dose one time if needed. For sleep. 03/01/12 03/31/12  Tamala JulianNeil T Mashburn, PA-C   Pulse 88  Temp(Src) 98.6 F (37 C)  Resp 18  Ht  5\' 11"  (1.803 m)  Wt 180 lb 8 oz (81.874 kg)  BMI 25.19 kg/m2  LMP 11/05/2015 Physical Exam  Constitutional: She is oriented to person, place, and time. She appears well-developed and well-nourished. No distress.  HENT:  Head: Normocephalic and atraumatic.  Eyes: Conjunctivae are normal. Right eye exhibits no discharge. Left eye exhibits no discharge. No scleral icterus.  Neck: Normal range of motion. Neck supple.  Cardiovascular: Normal rate.   Pulmonary/Chest: Effort normal.  Lymphadenopathy:    She has no cervical adenopathy.  Neurological: She is alert and oriented to person, place, and time. Coordination normal.  Skin: Skin is warm and dry. No rash noted. She is  not diaphoretic. No erythema. No pallor.  2.5 horizontal laceration to underside of chin. No foreign bodies seen.  No mandibular tenderness. No loose or chipped teeth.   Psychiatric: She has a normal mood and affect. Her behavior is normal.  Nursing note and vitals reviewed.   ED Course  Procedures  DIAGNOSTIC STUDIES: Oxygen Saturation is 100% on room air, normal by my interpretation.    COORDINATION OF CARE: 7:32 PM Discussed treatment plan which includes to perform laceration repair with pt. Pt acknowledges and agrees to plan.   LACERATION REPAIR PROCEDURE NOTE The patient's identification was confirmed and consent was obtained. This procedure was performed by Gaylyn Rong, PA-C at 7:37 PM. Site: chin  Sterile procedures observed Anesthetic used (type and amt): lidocaine 2% with epinephrine  Suture type/size:4-0 prolene Length:2.5 cm # of Sutures: 7 Technique:simple interrupted Antibx ointment applied, bacitracin Tetanus UTD or ordered: UTD. Site anesthetized, irrigated with NS, explored without evidence of foreign body, wound well approximated, site covered with dry, sterile dressing.  Patient tolerated procedure well without complications. Instructions for care discussed verbally and patient provided with additional written instructions for homecare and f/u.    MDM   Final diagnoses:  Laceration    Pressure irrigation performed. Laceration occurred < 8 hours prior to repair which was well tolerated. Pt has no co morbidities to effect normal wound healing. Discussed suture home care w pt and answered questions. Pt to f-u for wound check and suture removal in 5 days. Pt is hemodynamically stable w no complaints prior to dc.  Return precautions outlined in patient discharge instructions.     I personally performed the services described in this documentation, which was scribed in my presence. The recorded information has been reviewed and is accurate.      Lester Kinsman [place], PA-C 11/10/15 2034  Benjiman Core, MD 11/10/15 2056

## 2015-11-10 NOTE — ED Notes (Signed)
The pt has a lac chin after the horse she was riding  Clipped her chin with her hooves  After falling.  No other injuries no loose teeth no jaw pain. lmp 3 weeks

## 2016-09-23 LAB — OB RESULTS CONSOLE GC/CHLAMYDIA
Chlamydia: NEGATIVE
Gonorrhea: NEGATIVE

## 2016-09-23 LAB — OB RESULTS CONSOLE ABO/RH: RH TYPE: POSITIVE

## 2016-09-23 LAB — OB RESULTS CONSOLE RUBELLA ANTIBODY, IGM: Rubella: IMMUNE

## 2016-09-23 LAB — OB RESULTS CONSOLE HIV ANTIBODY (ROUTINE TESTING): HIV: NONREACTIVE

## 2016-09-23 LAB — OB RESULTS CONSOLE ANTIBODY SCREEN: ANTIBODY SCREEN: NEGATIVE

## 2016-09-23 LAB — OB RESULTS CONSOLE HEPATITIS B SURFACE ANTIGEN: Hepatitis B Surface Ag: NEGATIVE

## 2016-09-23 LAB — OB RESULTS CONSOLE RPR: RPR: NONREACTIVE

## 2016-11-03 NOTE — L&D Delivery Note (Signed)
Delivery Note Pt pushed for 45mins and at 4:13 PM a viable female was delivered via Vaginal, Spontaneous Delivery (Presentation: ;  ).  APGAR: 8, 9; weight  7lbs 10oz.   Placenta status:shultz with trailing membranes , .  Cord:3vc  with the following complications: nuchal x 1 easily reduced over infants head.   Anesthesia:  epidural Episiotomy:  none Lacerations: Vaginal;2nd degree Suture Repair: 2.0 vicryl Est. Blood Loss (mL): 300  Mom to postpartum.  Baby to Couplet care / Skin to Skin.  Cathrine MusterCecilia W Banga 04/22/2017, 4:41 PM

## 2016-11-14 ENCOUNTER — Encounter: Payer: Self-pay | Admitting: Emergency Medicine

## 2016-11-14 ENCOUNTER — Emergency Department
Admission: EM | Admit: 2016-11-14 | Discharge: 2016-11-14 | Disposition: A | Discharge status: Home / Self Care | Payer: Medicaid Other | Attending: Emergency Medicine | Admitting: Emergency Medicine

## 2016-11-14 DIAGNOSIS — Z3A16 16 weeks gestation of pregnancy: Secondary | ICD-10-CM | POA: Insufficient documentation

## 2016-11-14 DIAGNOSIS — O219 Vomiting of pregnancy, unspecified: Secondary | ICD-10-CM | POA: Diagnosis not present

## 2016-11-14 DIAGNOSIS — Z5321 Procedure and treatment not carried out due to patient leaving prior to being seen by health care provider: Secondary | ICD-10-CM | POA: Insufficient documentation

## 2016-11-14 NOTE — ED Triage Notes (Signed)
Pt ambulatory to triage with steady gait, no distress noted. Pt reports she is [redacted] weeks pregnant and is worried about her baby since she has been vomiting and had diarrhea since eating dinner last night. Pt denies fever. Pt sts "I just want to hear my baby."

## 2017-01-06 ENCOUNTER — Telehealth (HOSPITAL_COMMUNITY): Payer: Self-pay | Admitting: *Deleted

## 2017-01-06 ENCOUNTER — Inpatient Hospital Stay (HOSPITAL_COMMUNITY)
Admission: AD | Admit: 2017-01-06 | Discharge: 2017-01-06 | Disposition: A | Discharge status: Home / Self Care | Payer: Medicaid Other | Source: Ambulatory Visit | Attending: Obstetrics and Gynecology | Admitting: Obstetrics and Gynecology

## 2017-01-06 ENCOUNTER — Encounter (HOSPITAL_COMMUNITY): Payer: Self-pay | Admitting: *Deleted

## 2017-01-06 DIAGNOSIS — O4702 False labor before 37 completed weeks of gestation, second trimester: Secondary | ICD-10-CM | POA: Insufficient documentation

## 2017-01-06 DIAGNOSIS — O26892 Other specified pregnancy related conditions, second trimester: Secondary | ICD-10-CM | POA: Diagnosis present

## 2017-01-06 DIAGNOSIS — Z3A24 24 weeks gestation of pregnancy: Secondary | ICD-10-CM | POA: Insufficient documentation

## 2017-01-06 DIAGNOSIS — O479 False labor, unspecified: Secondary | ICD-10-CM | POA: Diagnosis not present

## 2017-01-06 DIAGNOSIS — R109 Unspecified abdominal pain: Secondary | ICD-10-CM | POA: Diagnosis present

## 2017-01-06 LAB — URINALYSIS, ROUTINE W REFLEX MICROSCOPIC
Bilirubin Urine: NEGATIVE
Glucose, UA: NEGATIVE mg/dL
Hgb urine dipstick: NEGATIVE
KETONES UR: NEGATIVE mg/dL
LEUKOCYTES UA: NEGATIVE
NITRITE: NEGATIVE
PH: 7 (ref 5.0–8.0)
PROTEIN: NEGATIVE mg/dL
Specific Gravity, Urine: 1.008 (ref 1.005–1.030)

## 2017-01-06 NOTE — Progress Notes (Signed)
Electronic signature pad not working. Pt signed paper copy and filed with chart.

## 2017-01-06 NOTE — MAU Provider Note (Signed)
Chief Complaint:  Abdominal Cramping   None     HPI: Cheryl Acevedo is a 26 y.o. G1P0 at 6445w2d who presents to maternity admissions reporting an episode of frequent abdominal cramping/tightening that started 2-3 hours ago. This is a new symptom. It continued until arrival in MAU with 5-6 contractions/cramps per hour but is now gradually resolving. She reports drinking enough water today and resting but the cramps did not change.  There are no associated symptoms. She reports good fetal movement, denies LOF, vaginal bleeding, vaginal itching/burning, urinary symptoms, h/a, dizziness, n/v, or fever/chills.    HPI  Past Medical History: Past Medical History:  Diagnosis Date  . Anxiety   . Bipolar disorder (HCC)   . Obsessive compulsive disorder   . Paranoia (HCC)     Past obstetric history: OB History  Gravida Para Term Preterm AB Living  1            SAB TAB Ectopic Multiple Live Births               # Outcome Date GA Lbr Len/2nd Weight Sex Delivery Anes PTL Lv  1 Current               Past Surgical History: Past Surgical History:  Procedure Laterality Date  . COSMETIC SURGERY    . WISDOM TOOTH EXTRACTION Bilateral 2000   x4    Family History: Family History  Problem Relation Age of Onset  . Cancer Mother   . Diabetes Other   . Cancer Other     Social History: Social History  Substance Use Topics  . Smoking status: Never Smoker  . Smokeless tobacco: Never Used  . Alcohol use No     Comment: Occasional    Allergies: No Known Allergies  Meds:  Prescriptions Prior to Admission  Medication Sig Dispense Refill Last Dose  . lurasidone (LATUDA) 20 MG TABS tablet Take 20 mg by mouth daily.   01/05/2017 at Unknown time  . Prenatal Vit-Fe Fumarate-FA (PRENATAL MULTIVITAMIN) TABS tablet Take 1 tablet by mouth daily at 12 noon.   01/06/2017 at Unknown time  . sertraline (ZOLOFT) 100 MG tablet Take 100 mg by mouth daily.   01/05/2017 at Unknown time  . haloperidol (HALDOL)  5 MG tablet Take 1 tablet (5 mg total) by mouth at bedtime. For depression and mental clarity. 30 tablet 0 03/21/2012 at Unknown  . sertraline (ZOLOFT) 100 MG tablet Take 2 tablets (200 mg total) by mouth daily. For anxiety and depression. 60 tablet 0 03/31/2012 at Unknown  . traZODone (DESYREL) 50 MG tablet Take 1 tablet (50 mg total) by mouth at bedtime and may repeat dose one time if needed. For sleep. 30 tablet 0 03/21/2012 at Unknown    ROS:  Review of Systems  Constitutional: Negative for chills, fatigue and fever.  Respiratory: Negative for shortness of breath.   Cardiovascular: Negative for chest pain.  Gastrointestinal: Positive for abdominal pain. Negative for constipation, nausea and vomiting.  Genitourinary: Negative for difficulty urinating, dysuria, flank pain, pelvic pain, vaginal bleeding, vaginal discharge and vaginal pain.  Neurological: Negative for dizziness and headaches.  Psychiatric/Behavioral: Negative.      I have reviewed patient's Past Medical Hx, Surgical Hx, Family Hx, Social Hx, medications and allergies.   Physical Exam   Patient Vitals for the past 24 hrs:  BP Temp Pulse Resp Height  01/06/17 1212 110/64 97 F (36.1 C) 90 18 5\' 10"  (1.778 m)   Constitutional: Well-developed, well-nourished  female in no acute distress.  Cardiovascular: normal rate Respiratory: normal effort GI: Abd soft, non-tender, gravid appropriate for gestational age.  MS: Extremities nontender, no edema, normal ROM Neurologic: Alert and oriented x 4.  GU: Neg CVAT.  Cervix 0/thick/high/posterior  Dilation: Closed Effacement (%): Thick Cervical Position: Posterior Exam by:: Leftwich-Kirby, CNM  FHT:  Baseline 140 , moderate variability, accelerations present, no decelerations Contractions: None on toco or to palpation   Labs: Results for orders placed or performed during the hospital encounter of 01/06/17 (from the past 24 hour(s))  Urinalysis, Routine w reflex microscopic      Status: None   Collection Time: 01/06/17 12:05 PM  Result Value Ref Range   Color, Urine YELLOW YELLOW   APPearance CLEAR CLEAR   Specific Gravity, Urine 1.008 1.005 - 1.030   pH 7.0 5.0 - 8.0   Glucose, UA NEGATIVE NEGATIVE mg/dL   Hgb urine dipstick NEGATIVE NEGATIVE   Bilirubin Urine NEGATIVE NEGATIVE   Ketones, ur NEGATIVE NEGATIVE mg/dL   Protein, ur NEGATIVE NEGATIVE mg/dL   Nitrite NEGATIVE NEGATIVE   Leukocytes, UA NEGATIVE NEGATIVE      Imaging:  No results found.  MAU Course/MDM: I have ordered labs and reviewed results.  NST reviewed Consult Dr Senaida Ores with presentation, exam findings and test results.  Reassurance provided to pt.  Preterm labor signs reviewed. Pt stable at time of discharge.   Assessment: 1. Braxton Hicks contractions     Plan: Discharge home with preterm labor precautions.  Follow-up Information    [facility] Follow up.   Why:  As scheduled, return to MAU as needed for signs of labor or emergencies Contact information: [address]  [address] [place] [address] [phone]          Allergies as of 01/06/2017   No Known Allergies     Medication List    STOP taking these medications   haloperidol 5 MG tablet Commonly known as:  HALDOL   traZODone 50 MG tablet Commonly known as:  DESYREL     TAKE these medications   LATUDA 20 MG Tabs tablet Generic drug:  lurasidone Take 20 mg by mouth daily.   prenatal multivitamin Tabs tablet Take 1 tablet by mouth daily at 12 noon.   sertraline 100 MG tablet Commonly known as:  ZOLOFT Take 100 mg by mouth daily. What changed:  Another medication with the same name was removed. Continue taking this medication, and follow the directions you see here.       Sharen Counter Certified Nurse-Midwife 01/06/2017 2:57 PM

## 2017-03-02 ENCOUNTER — Encounter (HOSPITAL_COMMUNITY): Payer: Self-pay | Admitting: *Deleted

## 2017-03-02 ENCOUNTER — Inpatient Hospital Stay (HOSPITAL_COMMUNITY)
Admission: AD | Admit: 2017-03-02 | Discharge: 2017-03-02 | Disposition: A | Discharge status: Home / Self Care | Payer: Medicaid Other | Source: Ambulatory Visit | Attending: Obstetrics and Gynecology | Admitting: Obstetrics and Gynecology

## 2017-03-02 DIAGNOSIS — O99343 Other mental disorders complicating pregnancy, third trimester: Secondary | ICD-10-CM | POA: Diagnosis not present

## 2017-03-02 DIAGNOSIS — R102 Pelvic and perineal pain: Secondary | ICD-10-CM | POA: Insufficient documentation

## 2017-03-02 DIAGNOSIS — N949 Unspecified condition associated with female genital organs and menstrual cycle: Secondary | ICD-10-CM

## 2017-03-02 DIAGNOSIS — M549 Dorsalgia, unspecified: Secondary | ICD-10-CM

## 2017-03-02 DIAGNOSIS — O26893 Other specified pregnancy related conditions, third trimester: Secondary | ICD-10-CM | POA: Insufficient documentation

## 2017-03-02 DIAGNOSIS — F429 Obsessive-compulsive disorder, unspecified: Secondary | ICD-10-CM | POA: Diagnosis not present

## 2017-03-02 DIAGNOSIS — Z3A32 32 weeks gestation of pregnancy: Secondary | ICD-10-CM | POA: Diagnosis not present

## 2017-03-02 DIAGNOSIS — M545 Low back pain: Secondary | ICD-10-CM | POA: Diagnosis present

## 2017-03-02 DIAGNOSIS — O99891 Other specified diseases and conditions complicating pregnancy: Secondary | ICD-10-CM

## 2017-03-02 DIAGNOSIS — F319 Bipolar disorder, unspecified: Secondary | ICD-10-CM | POA: Diagnosis not present

## 2017-03-02 DIAGNOSIS — O9989 Other specified diseases and conditions complicating pregnancy, childbirth and the puerperium: Secondary | ICD-10-CM | POA: Insufficient documentation

## 2017-03-02 DIAGNOSIS — F419 Anxiety disorder, unspecified: Secondary | ICD-10-CM | POA: Insufficient documentation

## 2017-03-02 DIAGNOSIS — R05 Cough: Secondary | ICD-10-CM | POA: Diagnosis not present

## 2017-03-02 LAB — URINALYSIS, ROUTINE W REFLEX MICROSCOPIC
BILIRUBIN URINE: NEGATIVE
Glucose, UA: NEGATIVE mg/dL
Hgb urine dipstick: NEGATIVE
KETONES UR: NEGATIVE mg/dL
LEUKOCYTES UA: NEGATIVE
Nitrite: NEGATIVE
PH: 7 (ref 5.0–8.0)
Protein, ur: NEGATIVE mg/dL
Specific Gravity, Urine: 1.005 (ref 1.005–1.030)

## 2017-03-02 LAB — WET PREP, GENITAL
CLUE CELLS WET PREP: NONE SEEN
Sperm: NONE SEEN
Trich, Wet Prep: NONE SEEN
Yeast Wet Prep HPF POC: NONE SEEN

## 2017-03-02 NOTE — MAU Provider Note (Signed)
History     CSN: [id number]  Arrival date and time: 03/02/17 4098   First Provider Initiated Contact with Patient 03/02/17 [phone]      Chief Complaint  Patient presents with  . Back Pain  . Cough   Cheryl Acevedo is a 26 y.o. G1P0 at [redacted]w[redacted]d who presents today with back pain and cough since yesterday. She denies any VB or LOF. She confirms normal fetal movement. She is also having some pressure and cramps. She is worried about possible preterm labor.    Back Pain  This is a new problem. The current episode started yesterday. The problem occurs constantly. The problem is unchanged. The pain is present in the lumbar spine. The pain does not radiate. The pain is at a severity of 6/10. The symptoms are aggravated by lying down. Stiffness is present in the morning. Associated symptoms include abdominal pain ("period cramp" sensation intermittantly. ). Pertinent negatives include no dysuria or fever. Risk factors include pregnancy. She has tried nothing for the symptoms.  Cough  This is a new problem. The current episode started yesterday. The problem has been unchanged. The cough is non-productive. Pertinent negatives include no chills or fever. The symptoms are aggravated by pollens.   Past Medical History:  Diagnosis Date  . Anxiety   . Bipolar disorder (HCC)   . Obsessive compulsive disorder   . Paranoia [facility])     Past Surgical History:  Procedure Laterality Date  . COSMETIC SURGERY    . WISDOM TOOTH EXTRACTION Bilateral 2000   x4    Family History  Problem Relation Age of Onset  . Cancer Mother   . Diabetes Other   . Cancer Other     Social History  Substance Use Topics  . Smoking status: Never Smoker  . Smokeless tobacco: Never Used  . Alcohol use No     Comment: Occasional    Allergies: No Known Allergies  Prescriptions Prior to Admission  Medication Sig Dispense Refill Last Dose  . lurasidone (LATUDA) 20 MG TABS tablet Take 20 mg by mouth daily.   01/05/2017 at  Unknown time  . Prenatal Vit-Fe Fumarate-FA (PRENATAL MULTIVITAMIN) TABS tablet Take 1 tablet by mouth daily at 12 noon.   01/06/2017 at Unknown time  . sertraline (ZOLOFT) 100 MG tablet Take 100 mg by mouth daily.   01/05/2017 at Unknown time    Review of Systems  Constitutional: Negative for chills and fever.  Respiratory: Positive for cough.   Gastrointestinal: Positive for abdominal pain ("period cramp" sensation intermittantly. ) and vomiting ("vomited once yesterday, it could have been from drinking too much orange juice"). Negative for nausea.  Genitourinary: Negative for dysuria, frequency, urgency, vaginal bleeding and vaginal discharge.  Musculoskeletal: Positive for back pain.   Physical Exam   Blood pressure 117/72, pulse 96, temperature 97.6 F (36.4 C), temperature source Oral, resp. rate 20, height  (1.803 m), weight 203 lb 12 oz (92.4 kg), last menstrual period 07/20/2016.  Physical Exam  Nursing note and vitals reviewed. Constitutional: She is oriented to person, place, and time. She appears well-developed and well-nourished. No distress.  HENT:  Head: Normocephalic.  Cardiovascular: Normal rate.   Respiratory: Effort normal.  GI: Soft. There is no tenderness. There is no rebound.  Genitourinary:  Genitourinary Comments: Cervix: closed/thick/ ballotable   Neurological: She is alert and oriented to person, place, and time.  Skin: Skin is warm and dry.  Psychiatric: She has a normal mood and affect.  Bedside US: Breech   FHT: 130, moderate with 15x15 accels, no decels Toco: no UCs   Results for orders placed or performed during the hospital encounter of 03/02/17 (from the past 24 hour(s))  Urinalysis, Routine w reflex microscopic     Status: Abnormal   Collection Time: 03/02/17  3:10 AM  Result Value Ref Range   Color, Urine STRAW (A) YELLOW   APPearance CLEAR CLEAR   Specific Gravity, Urine 1.005 1.005 - 1.030   pH 7.0 5.0 - 8.0   Glucose, UA NEGATIVE  NEGATIVE mg/dL   Hgb urine dipstick NEGATIVE NEGATIVE   Bilirubin Urine NEGATIVE NEGATIVE   Ketones, ur NEGATIVE NEGATIVE mg/dL   Protein, ur NEGATIVE NEGATIVE mg/dL   Nitrite NEGATIVE NEGATIVE   Leukocytes, UA NEGATIVE NEGATIVE  Wet prep, genital     Status: Abnormal   Collection Time: 03/02/17  3:35 AM  Result Value Ref Range   Yeast Wet Prep HPF POC NONE SEEN NONE SEEN   Trich, Wet Prep NONE SEEN NONE SEEN   Clue Cells Wet Prep HPF POC NONE SEEN NONE SEEN   WBC, Wet Prep HPF POC FEW (A) NONE SEEN   Sperm NONE SEEN     MAU Course  Procedures  MDM Patient expressing a lot of concern about baby being at her last check up. Wants to know if baby is still breech today. Limited US done. Discussed spinning babies or acupuncture to encourage baby to turn.   1610: D/W Dr. Jackelyn Knife ok for DC home.   Assessment and Plan   1. Back pain affecting pregnancy in third trimester   2. Round ligament pain   3. [redacted] weeks gestation of pregnancy    DC home Comfort measures reviewed  3rd Trimester precautions  PTL precautions  Fetal kick counts RX: none, OTC tylenol as needed for back pain  Return to MAU as needed FU with OB as planned  Follow-up Information    Zenaida Niece, MD Follow up.   Specialty:  Obstetrics and Gynecology Contact information: [address] [place] [address] [phone]            Thressa Sheller 03/02/2017, 3:25 AM

## 2017-03-02 NOTE — Discharge Instructions (Signed)
Round Ligament Pain during Pregnancy Many women will experience a type of pain referred to as round ligament pain during their pregnancy. This is associated with abdominal pain or discomfort. Since any type of abdominal pain during pregnancy can be disconcerting, it is important to talk about round ligament pain to relieve any anxiety or fears you may have regarding the symptoms you are feeling. Round ligament pain is due to normal changes that take place in the body during pregnancy. It is caused by stretching of the round ligaments attached to the uterus. More commonly it occurs on the right side of the pelvis. Round Ligament: An Overview Typically in the non-pregnant state the uterus is about the size of an apple or pear. There are thick ligaments which hold the uterus in place in the abdomen, referred to as round ligaments. During pregnancy, your uterus will expand in size and weight, and the ligaments supporting it will have to stretch, becoming longer and thinner. As these ligaments pull and tug they may irritate nearby nerve fibers, which causes pain. The severity of the pain in some cases can seem extreme. Some common symptoms of round ligament pain include:  Ligament spasms or contractions/cramps that trigger a sharp pain typically on the right side of the abdomen.  Pain upon waking or suddenly rolling over in your sleep.  Pain in the abdomen that is sharp brought on by exercise or other vigorous activity. Similar Problems Round ligament pain is often mistaken for other medical conditions because the symptoms are similar. Acute abdominal pain during pregnancy may also be a sign of other conditions including:  Abdominal cramps - Some abdominal pain is simply caused by change in bowel habits associated with pregnancy. Gas is a common problem that can cause sharp, shooting pain. You should always seek out medical care if your pain is accompanied by fever, chills, pain  upon urination or if you have difficulty walking. Further exams and tests will be conducted to ensure that you do not have a more serious condition. It is not uncommon for women with lower abdominal pain to have a urinary tract infection, thus you may also be asked for a urine sample. Treatment If all other conditions are ruled out you can treat your round ligament pain relatively easily. You may be advised to take some acetaminophen (Tylenol) to reduce the severity of any persistent pain and asked to reduce your activity level. You can apply a heating pad to the area of pain or take a warm bath. Lying on the opposite side of the pain may help as well. Most women will find relief from round ligament pain simply by altering their daily routines slightly. The good news is round ligament pain will disappear completely once you have given birth to your child!   Back Exercises The following exercises strengthen the muscles that help to support the back. They also help to keep the lower back flexible. Doing these exercises can help to prevent back pain or lessen existing pain. If you have back pain or discomfort, try doing these exercises 2-3 times each day or as told by your health care provider. When the pain goes away, do them once each day, but increase the number of times that you repeat the steps for each exercise (do more repetitions). If you do not have back pain or discomfort, do these exercises once each day or as told by your health care provider. Exercises Single Knee to Chest   Repeat these steps 3-5 times  for each leg: 1. Lie on your back on a firm bed or the floor with your legs extended. 2. Bring one knee to your chest. Your other leg should stay extended and in contact with the floor. 3. Hold your knee in place by grabbing your knee or thigh. 4. Pull on your knee until you feel a gentle stretch in your lower back. 5. Hold the stretch for 10-30 seconds. 6. Slowly release and  straighten your leg. Pelvic Tilt   Repeat these steps 5-10 times: 1. Lie on your back on a firm bed or the floor with your legs extended. 2. Bend your knees so they are pointing toward the ceiling and your feet are flat on the floor. 3. Tighten your lower abdominal muscles to press your lower back against the floor. This motion will tilt your pelvis so your tailbone points up toward the ceiling instead of pointing to your feet or the floor. 4. With gentle tension and even breathing, hold this position for 5-10 seconds. Cat-Cow   Repeat these steps until your lower back becomes more flexible: 1. Get into a hands-and-knees position on a firm surface. Keep your hands under your shoulders, and keep your knees under your hips. You may place padding under your knees for comfort. 2. Let your head hang down, and point your tailbone toward the floor so your lower back becomes rounded like the back of a cat. 3. Hold this position for 5 seconds. 4. Slowly lift your head and point your tailbone up toward the ceiling so your back forms a sagging arch like the back of a cow. 5. Hold this position for 5 seconds. Press-Ups   Repeat these steps 5-10 times: 1. Lie on your abdomen (face-down) on the floor. 2. Place your palms near your head, about shoulder-width apart. 3. While you keep your back as relaxed as possible and keep your hips on the floor, slowly straighten your arms to raise the top half of your body and lift your shoulders. Do not use your back muscles to raise your upper torso. You may adjust the placement of your hands to make yourself more comfortable. 4. Hold this position for 5 seconds while you keep your back relaxed. 5. Slowly return to lying flat on the floor. Bridges   Repeat these steps 10 times: 1. Lie on your back on a firm surface. 2. Bend your knees so they are pointing toward the ceiling and your feet are flat on the floor. 3. Tighten your buttocks muscles and lift your  buttocks off of the floor until your waist is at almost the same height as your knees. You should feel the muscles working in your buttocks and the back of your thighs. If you do not feel these muscles, slide your feet 1-2 inches farther away from your buttocks. 4. Hold this position for 3-5 seconds. 5. Slowly lower your hips to the starting position, and allow your buttocks muscles to relax completely. If this exercise is too easy, try doing it with your arms crossed over your chest. Abdominal Crunches   Repeat these steps 5-10 times: 1. Lie on your back on a firm bed or the floor with your legs extended. 2. Bend your knees so they are pointing toward the ceiling and your feet are flat on the floor. 3. Cross your arms over your chest. 4. Tip your chin slightly toward your chest without bending your neck. 5. Tighten your abdominal muscles and slowly raise your trunk (torso) high enough  to lift your shoulder blades a tiny bit off of the floor. Avoid raising your torso higher than that, because it can put too much stress on your low back and it does not help to strengthen your abdominal muscles. 6. Slowly return to your starting position. Back Lifts  Repeat these steps 5-10 times: 1. Lie on your abdomen (face-down) with your arms at your sides, and rest your forehead on the floor. 2. Tighten the muscles in your legs and your buttocks. 3. Slowly lift your chest off of the floor while you keep your hips pressed to the floor. Keep the back of your head in line with the curve in your back. Your eyes should be looking at the floor. 4. Hold this position for 3-5 seconds. 5. Slowly return to your starting position. Contact a health care provider if:  Your back pain or discomfort gets much worse when you do an exercise.  Your back pain or discomfort does not lessen within 2 hours after you exercise. If you have any of these problems, stop doing these exercises right away. Do not do them again unless  your health care provider says that you can. Get help right away if:  You develop sudden, severe back pain. If this happens, stop doing the exercises right away. Do not do them again unless your health care provider says that you can. This information is not intended to replace advice given to you by your health care provider. Make sure you discuss any questions you have with your health care provider. Document Released: 11/27/2004 Document Revised: 02/27/2016 Document Reviewed: 12/14/2014 Elsevier Interactive Patient Education  2017 [employer].

## 2017-03-02 NOTE — MAU Note (Signed)
PT  SAYS SHE STARTED  COUGHING  Sunday AM.     CALLED CALL A NURSE- TONIGHT .        PT  THINKS  SHE HAS DROPPED-FEELS  CRAMPING -STARTED LAST NIGHT .    VOMITED  Sunday - X1   AFTER COUGHING - NON - PRODUCTIVE -     BACK HURTS   TO WALK

## 2017-03-05 NOTE — Progress Notes (Signed)
FHT from 4-30 reviewed.  Reactive NST, no significant decels

## 2017-03-21 ENCOUNTER — Emergency Department (HOSPITAL_COMMUNITY)
Admission: EM | Admit: 2017-03-21 | Discharge: 2017-03-21 | Disposition: A | Discharge status: Home / Self Care | Payer: Medicaid Other | Attending: Emergency Medicine | Admitting: Emergency Medicine

## 2017-03-21 ENCOUNTER — Encounter (HOSPITAL_COMMUNITY): Payer: Self-pay | Admitting: Nurse Practitioner

## 2017-03-21 DIAGNOSIS — Z3A34 34 weeks gestation of pregnancy: Secondary | ICD-10-CM | POA: Insufficient documentation

## 2017-03-21 DIAGNOSIS — Z79899 Other long term (current) drug therapy: Secondary | ICD-10-CM | POA: Diagnosis not present

## 2017-03-21 DIAGNOSIS — O4700 False labor before 37 completed weeks of gestation, unspecified trimester: Secondary | ICD-10-CM

## 2017-03-21 DIAGNOSIS — O479 False labor, unspecified: Secondary | ICD-10-CM

## 2017-03-21 MED ORDER — SODIUM CHLORIDE 0.9 % IV BOLUS (SEPSIS)
1000.0000 mL | Freq: Once | INTRAVENOUS | Status: AC
  Administered 2017-03-21: 1000 mL via INTRAVENOUS

## 2017-03-21 NOTE — Discharge Instructions (Signed)
As discussed, your evaluation today has been largely reassuring.  But, it is important that you monitor your condition carefully, and do not hesitate to return to the ED if you develop new, or concerning changes in your condition. ? ?Otherwise, please follow-up with your physician for appropriate ongoing care. ? ?

## 2017-03-21 NOTE — ED Provider Notes (Signed)
[facility]-EMERGENCY DEPT Provider Note   CSN: [id number] Arrival date & time: 03/21/17  4098     History   Chief Complaint Chief Complaint  Patient presents with  . Contractions    HPI Cheryl Acevedo is a 26 y.o. female.  HPI  Patient p/w concern of contractions.  She is ~[redacted]wk pregnant w her second (G2P0) pregnancy.  After receiving her breast pump in the mail, she tried it out.  Soon thereafter she felt contractions.  This occurred just PTA. No new vag bleeding, fluid drainage.  She notes that the pregnancy has been going generally well.  She denies other medical issues.  Past Medical History:  Diagnosis Date  . Anxiety   . Bipolar disorder (HCC)   . Obsessive compulsive disorder   . Paranoia [facility])     Patient Active Problem List   Diagnosis Date Noted  . Schizoaffective disorder, bipolar type (HCC) 02/20/2012  . Major depressive disorder, recurrent (HCC) 02/17/2012  . OCD (obsessive compulsive disorder) 02/17/2012  . Generalized anxiety disorder 02/17/2012    Past Surgical History:  Procedure Laterality Date  . COSMETIC SURGERY    . WISDOM TOOTH EXTRACTION Bilateral 2000   x4    OB History    Gravida Para Term Preterm AB Living   2       1     SAB TAB Ectopic Multiple Live Births     1             Home Medications    Prior to Admission medications   Medication Sig Start Date End Date Taking? Authorizing Provider  lurasidone (LATUDA) 20 MG TABS tablet Take 20 mg by mouth daily.    [provider]  Prenatal Vit-Fe Fumarate-FA (PRENATAL MULTIVITAMIN) TABS tablet Take 1 tablet by mouth daily at 12 noon.    [provider]  sertraline (ZOLOFT) 100 MG tablet Take 100 mg by mouth daily.    [provider]    Family History Family History  Problem Relation Age of Onset  . Cancer Mother   . Diabetes Other   . Cancer Other     Social History Social History  Substance Use Topics  . Smoking status: Never Smoker  . Smokeless  tobacco: Never Used  . Alcohol use No     Comment: Occasional     Allergies   Patient has no known allergies.   Review of Systems Review of Systems  Constitutional:       Per HPI, otherwise negative  HENT:       Per HPI, otherwise negative  Respiratory:       Per HPI, otherwise negative  Cardiovascular:       Per HPI, otherwise negative  Gastrointestinal: Negative for vomiting.  Endocrine:       Negative aside from HPI  Genitourinary:       Neg aside from HPI   Musculoskeletal:       Per HPI, otherwise negative  Skin: Negative.   Allergic/Immunologic: Negative for immunocompromised state.  Neurological: Negative for syncope.     Physical Exam Updated Vital Signs LMP 07/20/2016   Physical Exam  Constitutional: She is oriented to person, place, and time. She appears well-developed and well-nourished. No distress.  Gravid young F awake and alert  HENT:  Head: Normocephalic and atraumatic.  Eyes: Conjunctivae and EOM are normal.  Cardiovascular: Normal rate, regular rhythm and intact distal pulses.   Pulmonary/Chest: Effort normal. No stridor. No respiratory distress.  Abdominal:  Protuberant abdomen  Musculoskeletal: She exhibits no edema.  Neurological: She is alert and oriented to person, place, and time. No cranial nerve deficit.  Skin: Skin is warm and dry.  Psychiatric: She has a normal mood and affect.  Nursing note and vitals reviewed.    ED Treatments / Results  Labs (all labs ordered are listed, but only abnormal results are displayed) Labs Reviewed  URINALYSIS, ROUTINE W REFLEX MICROSCOPIC     Procedures Procedures (including critical care time)  Medications Ordered in ED Medications  sodium chloride 0.9 % bolus 1,000 mL (1,000 mLs Intravenous New Bag/Given 03/21/17 0700)     Initial Impression / Assessment and Plan / ED Course  I have reviewed the triage vital signs and the nursing notes.  Pertinent labs & imaging results that were  available during my care of the patient were reviewed by me and considered in my medical decision making (see chart for details).  After arrival the patient  Was made known to our rapid response OB team.  She was placed on monitors, and her cervix was evaluated (by OB RN).   On monitors, no desaturations, FHT ~130.  No e/o immanent delivery.  No other complaints.  Fetus monitored for hours w no decelerations.  Patient d/w OB (via RN) as well. Final Clinical Impressions(s) / ED Diagnoses  Premature contractions of pregnancy   Gerhard MunchLockwood, Jaeleah Smyser, MD 03/21/17 [phone]

## 2017-03-21 NOTE — Progress Notes (Signed)
Pt is a G2P0 at 34 6/[redacted] weeks gestation here because she used her breast pump last night. Says she started feeling cramping about 3 hours later. No vaginal bleeding or leaking of fluid. [facility]. FHR tracing is a category 1 with no uc's. toco adj. Moved lower down on abd. [facility]

## 2017-03-21 NOTE — Progress Notes (Signed)
Spoke with Dr. Senaida Oresichardson. Pt is a G2P0 at 34 6/[redacted] weeks gestation here with c/o abd pain. Says she used her breast pump last night and started having pain after that. FHR tracing is a category 1 with 1 uc. No vaginal bleeding or leaking of fluid. Cervix is closed, thk, presenting part high. Pt is OB cleared.[facility] related complaints and to not use her breast pump until she is no longer pregnant.

## 2017-03-21 NOTE — ED Triage Notes (Signed)
Pt states she received her breast pump in the mail yesterday and to be curious she decided to start pumping last night. About 2 hours after pumping she started having mild abdominal cramping similar to braxton hicks, however throughout the night the contractions worsened and became more constant and about every 2-3 min lasting 30 seconds. Severe and takes her breath when they happened. States she has been having a mild discharge for the past 2-3 weeks and her OB is aware of.

## 2017-03-21 NOTE — ED Notes (Signed)
[facility] nurse at bedside report given, IVF infusing, rainbow set blood tubes obtained and at bedside.

## 2017-03-21 NOTE — ED Notes (Signed)
[facility] called at [phone] and notified of pt's arrival staff member enroute to evaluate Ms Cheryl Acevedo

## 2017-03-25 LAB — OB RESULTS CONSOLE GBS: GBS: POSITIVE

## 2017-04-21 ENCOUNTER — Inpatient Hospital Stay (HOSPITAL_COMMUNITY)
Admission: AD | Admit: 2017-04-21 | Discharge: 2017-04-21 | Disposition: A | Discharge status: Home / Self Care | Payer: Commercial Managed Care - PPO | Source: Ambulatory Visit | Attending: Obstetrics and Gynecology | Admitting: Obstetrics and Gynecology

## 2017-04-21 ENCOUNTER — Encounter (HOSPITAL_COMMUNITY): Payer: Self-pay | Admitting: *Deleted

## 2017-04-21 DIAGNOSIS — O471 False labor at or after 37 completed weeks of gestation: Secondary | ICD-10-CM

## 2017-04-21 DIAGNOSIS — Z3A39 39 weeks gestation of pregnancy: Secondary | ICD-10-CM | POA: Insufficient documentation

## 2017-04-21 DIAGNOSIS — F250 Schizoaffective disorder, bipolar type: Secondary | ICD-10-CM

## 2017-04-21 DIAGNOSIS — Z3A00 Weeks of gestation of pregnancy not specified: Secondary | ICD-10-CM

## 2017-04-21 NOTE — Discharge Instructions (Signed)
Fetal Movement Counts °Patient Name: ________________________________________________ Patient Due Date: ____________________ °What is a fetal movement count? °A fetal movement count is the number of times that you feel your baby move during a certain amount of time. This may also be called a fetal kick count. A fetal movement count is recommended for every pregnant woman. You may be asked to start counting fetal movements as early as week 28 of your pregnancy. °Pay attention to when your baby is most active. You may notice your baby's sleep and wake cycles. You may also notice things that make your baby move more. You should do a fetal movement count: °· When your baby is normally most active. °· At the same time each day. ° °A good time to count movements is while you are resting, after having something to eat and drink. °How do I count fetal movements? °1. Find a quiet, comfortable area. Sit, or lie down on your side. °2. Write down the date, the start time and stop time, and the number of movements that you felt between those two times. Take this information with you to your health care visits. °3. For 2 hours, count kicks, flutters, swishes, rolls, and jabs. You should feel at least 10 movements during 2 hours. °4. You may stop counting after you have felt 10 movements. °5. If you do not feel 10 movements in 2 hours, have something to eat and drink. Then, keep resting and counting for 1 hour. If you feel at least 4 movements during that hour, you may stop counting. °Contact a health care provider if: °· You feel fewer than 4 movements in 2 hours. °· Your baby is not moving like he or she usually does. °Date: ____________ Start time: ____________ Stop time: ____________ Movements: ____________ °Date: ____________ Start time: ____________ Stop time: ____________ Movements: ____________ °Date: ____________ Start time: ____________ Stop time: ____________ Movements: ____________ °Date: ____________ Start time:  ____________ Stop time: ____________ Movements: ____________ °Date: ____________ Start time: ____________ Stop time: ____________ Movements: ____________ °Date: ____________ Start time: ____________ Stop time: ____________ Movements: ____________ °Date: ____________ Start time: ____________ Stop time: ____________ Movements: ____________ °Date: ____________ Start time: ____________ Stop time: ____________ Movements: ____________ °Date: ____________ Start time: ____________ Stop time: ____________ Movements: ____________ °This information is not intended to replace advice given to you by your health care provider. Make sure you discuss any questions you have with your health care provider. °Document Released: 11/19/2006 Document Revised: 06/18/2016 Document Reviewed: 11/29/2015 °Elsevier Interactive Patient Education © 2018 [employer] °Braxton Hicks Contractions °Contractions of the uterus can occur throughout pregnancy, but they are not always a sign that you are in labor. You may have practice contractions called Braxton Hicks contractions. These false labor contractions are sometimes confused with true labor. °What are Braxton Hicks contractions? °Braxton Hicks contractions are tightening movements that occur in the muscles of the uterus before labor. Unlike true labor contractions, these contractions do not result in opening (dilation) and thinning of the cervix. Toward the end of pregnancy (32-34 weeks), Braxton Hicks contractions can happen more often and may become stronger. These contractions are sometimes difficult to tell apart from true labor because they can be very uncomfortable. You should not feel embarrassed if you go to the hospital with false labor. °Sometimes, the only way to tell if you are in true labor is for your health care provider to look for changes in the cervix. The health care provider will do a physical exam and may monitor your contractions. If   you are not in true labor, the exam  should show that your cervix is not dilating and your water has not broken. °If there are no prenatal problems or other health problems associated with your pregnancy, it is completely safe for you to be sent home with false labor. You may continue to have Braxton Hicks contractions until you go into true labor. °How can I tell the difference between true labor and false labor? °· Differences °? False labor °? Contractions last 30-70 seconds.: Contractions are usually shorter and not as strong as true labor contractions. °? Contractions become very regular.: Contractions are usually irregular. °? Discomfort is usually felt in the top of the uterus, and it spreads to the lower abdomen and low back.: Contractions are often felt in the front of the lower abdomen and in the groin. °? Contractions do not go away with walking.: Contractions may go away when you walk around or change positions while lying down. °? Contractions usually become more intense and increase in frequency.: Contractions get weaker and are shorter-lasting as time goes on. °? The cervix dilates and gets thinner.: The cervix usually does not dilate or become thin. °Follow these instructions at home: °· Take over-the-counter and prescription medicines only as told by your health care provider. °· Keep up with your usual exercises and follow other instructions from your health care provider. °· Eat and drink lightly if you think you are going into labor. °· If Braxton Hicks contractions are making you uncomfortable: °? Change your position from lying down or resting to walking, or change from walking to resting. °? Sit and rest in a tub of warm water. °? Drink enough fluid to keep your urine clear or pale yellow. Dehydration may cause these contractions. °? Do slow and deep breathing several times an hour. °· Keep all follow-up prenatal visits as told by your health care provider. This is important. °Contact a health care provider if: °· You have a  fever. °· You have continuous pain in your abdomen. °Get help right away if: °· Your contractions become stronger, more regular, and closer together. °· You have fluid leaking or gushing from your vagina. °· You pass blood-tinged mucus (bloody show). °· You have bleeding from your vagina. °· You have low back pain that you never had before. °· You feel your baby’s head pushing down and causing pelvic pressure. °· Your baby is not moving inside you as much as it used to. °Summary °· Contractions that occur before labor are called Braxton Hicks contractions, false labor, or practice contractions. °· Braxton Hicks contractions are usually shorter, weaker, farther apart, and less regular than true labor contractions. True labor contractions usually become progressively stronger and regular and they become more frequent. °· Manage discomfort from Braxton Hicks contractions by changing position, resting in a warm bath, drinking plenty of water, or practicing deep breathing. °This information is not intended to replace advice given to you by your health care provider. Make sure you discuss any questions you have with your health care provider. °Document Released: 10/20/2005 Document Revised: 09/08/2016 Document Reviewed: 09/08/2016 °Elsevier Interactive Patient Education © 2017 [employer] ° °

## 2017-04-21 NOTE — MAU Note (Signed)
Pt states she was here last night for contractions. States contractions are more painful and closer together now. States they are every 7 mins. States she has some bloody show. Pt reports she was 1/80 last night. Pt denies LOF.

## 2017-04-21 NOTE — MAU Provider Note (Signed)
120-130, mod var, + accels, reactive tracing Irregular contractions

## 2017-04-21 NOTE — MAU Note (Signed)
I have communicated with Dr.Bovard and reviewed vital signs:  Vitals:   04/21/17 2143  BP: 119/83  Pulse: 93  Resp: 18  Temp: 97.8 F (36.6 C)    Vaginal exam:  Dilation: 1.5 Effacement (%): 80 Cervical Position: Middle Station: -3 Presentation: Vertex Exam by:: Jhaden Pizzuto fields rn ,   Also reviewed contraction pattern and that non-stress test is reactive.  It has been documented that patient is contracting every irregular ctx  with no cervical change over 1 hours not indicating active labor.  Patient denies any other complaints.  Based on this report provider has given order for discharge.  A discharge order and diagnosis entered by a provider.   Labor discharge instructions reviewed with patient.

## 2017-04-22 ENCOUNTER — Inpatient Hospital Stay (HOSPITAL_COMMUNITY): Payer: Commercial Managed Care - PPO | Admitting: Anesthesiology

## 2017-04-22 ENCOUNTER — Inpatient Hospital Stay (HOSPITAL_COMMUNITY)
Admission: AD | Admit: 2017-04-22 | Discharge: 2017-04-24 | DRG: 775 | Disposition: A | Discharge status: Home / Self Care | Payer: Commercial Managed Care - PPO | Source: Ambulatory Visit | Attending: Obstetrics and Gynecology | Admitting: Obstetrics and Gynecology

## 2017-04-22 ENCOUNTER — Encounter (HOSPITAL_COMMUNITY): Payer: Self-pay

## 2017-04-22 DIAGNOSIS — O99824 Streptococcus B carrier state complicating childbirth: Principal | ICD-10-CM | POA: Diagnosis present

## 2017-04-22 DIAGNOSIS — Z3A39 39 weeks gestation of pregnancy: Secondary | ICD-10-CM

## 2017-04-22 DIAGNOSIS — F429 Obsessive-compulsive disorder, unspecified: Secondary | ICD-10-CM | POA: Diagnosis present

## 2017-04-22 DIAGNOSIS — F329 Major depressive disorder, single episode, unspecified: Secondary | ICD-10-CM | POA: Diagnosis present

## 2017-04-22 DIAGNOSIS — O99344 Other mental disorders complicating childbirth: Secondary | ICD-10-CM | POA: Diagnosis present

## 2017-04-22 DIAGNOSIS — F909 Attention-deficit hyperactivity disorder, unspecified type: Secondary | ICD-10-CM | POA: Diagnosis present

## 2017-04-22 DIAGNOSIS — Z3493 Encounter for supervision of normal pregnancy, unspecified, third trimester: Secondary | ICD-10-CM | POA: Diagnosis present

## 2017-04-22 DIAGNOSIS — F419 Anxiety disorder, unspecified: Secondary | ICD-10-CM | POA: Diagnosis present

## 2017-04-22 HISTORY — DX: Depression, unspecified: F32.A

## 2017-04-22 HISTORY — DX: Major depressive disorder, single episode, unspecified: F32.9

## 2017-04-22 LAB — CBC
HCT: 32.3 % — ABNORMAL LOW (ref 36.0–46.0)
Hemoglobin: 11.2 g/dL — ABNORMAL LOW (ref 12.0–15.0)
MCH: 28.6 pg (ref 26.0–34.0)
MCHC: 34.7 g/dL (ref 30.0–36.0)
MCV: 82.6 fL (ref 78.0–100.0)
PLATELETS: 305 10*3/uL (ref 150–400)
RBC: 3.91 MIL/uL (ref 3.87–5.11)
RDW: 13.7 % (ref 11.5–15.5)
WBC: 13.8 10*3/uL — AB (ref 4.0–10.5)

## 2017-04-22 LAB — TYPE AND SCREEN
ABO/RH(D): A POS
ANTIBODY SCREEN: NEGATIVE

## 2017-04-22 LAB — RPR: RPR Ser Ql: NONREACTIVE

## 2017-04-22 LAB — ABO/RH: ABO/RH(D): A POS

## 2017-04-22 MED ORDER — DIBUCAINE 1 % RE OINT: 1.0000 "application " | TOPICAL_OINTMENT | RECTAL | Status: DC | PRN

## 2017-04-22 MED ORDER — FENTANYL 2.5 MCG/ML BUPIVACAINE 1/10 % EPIDURAL INFUSION (WH - ANES)
14.0000 mL/h | INTRAMUSCULAR | Status: DC | PRN
  Administered 2017-04-22: 14 mL/h via EPIDURAL
  Filled 2017-04-22: qty 100

## 2017-04-22 MED ORDER — FLEET ENEMA 7-19 GM/118ML RE ENEM: 1.0000 | ENEMA | RECTAL | Status: DC | PRN

## 2017-04-22 MED ORDER — DIPHENHYDRAMINE HCL 25 MG PO CAPS: 25.0000 mg | ORAL_CAPSULE | Freq: Four times a day (QID) | ORAL | Status: DC | PRN

## 2017-04-22 MED ORDER — LACTATED RINGERS IV SOLN
INTRAVENOUS | Status: DC
  Administered 2017-04-22: 11:00:00 via INTRAVENOUS

## 2017-04-22 MED ORDER — PENICILLIN G POT IN DEXTROSE 60000 UNIT/ML IV SOLN
3.0000 10*6.[IU] | INTRAVENOUS | Status: DC
  Administered 2017-04-22: 3 10*6.[IU] via INTRAVENOUS
  Filled 2017-04-22 (×4): qty 50

## 2017-04-22 MED ORDER — ACETAMINOPHEN 325 MG PO TABS: 650.0000 mg | ORAL_TABLET | ORAL | Status: DC | PRN

## 2017-04-22 MED ORDER — LURASIDONE HCL 20 MG PO TABS
20.0000 mg | ORAL_TABLET | Freq: Every day | ORAL | Status: DC
  Administered 2017-04-23 – 2017-04-24 (×2): 20 mg via ORAL
  Filled 2017-04-22 (×2): qty 1

## 2017-04-22 MED ORDER — ZOLPIDEM TARTRATE 5 MG PO TABS: 5.0000 mg | ORAL_TABLET | Freq: Every evening | ORAL | Status: DC | PRN

## 2017-04-22 MED ORDER — PHENYLEPHRINE 40 MCG/ML (10ML) SYRINGE FOR IV PUSH (FOR BLOOD PRESSURE SUPPORT)
80.0000 ug | PREFILLED_SYRINGE | INTRAVENOUS | Status: DC | PRN
  Filled 2017-04-22: qty 5

## 2017-04-22 MED ORDER — LIDOCAINE HCL (PF) 1 % IJ SOLN
INTRAMUSCULAR | Status: DC | PRN
  Administered 2017-04-22 (×2): 5 mL via EPIDURAL

## 2017-04-22 MED ORDER — OXYCODONE-ACETAMINOPHEN 5-325 MG PO TABS: 1.0000 | ORAL_TABLET | ORAL | Status: DC | PRN

## 2017-04-22 MED ORDER — LACTATED RINGERS IV SOLN
500.0000 mL | Freq: Once | INTRAVENOUS | Status: AC
  Administered 2017-04-22: 500 mL via INTRAVENOUS

## 2017-04-22 MED ORDER — PENICILLIN G POTASSIUM 5000000 UNITS IJ SOLR: 5.0000 10*6.[IU] | Freq: Once | INTRAVENOUS | Status: DC

## 2017-04-22 MED ORDER — OXYCODONE-ACETAMINOPHEN 5-325 MG PO TABS: 2.0000 | ORAL_TABLET | ORAL | Status: DC | PRN

## 2017-04-22 MED ORDER — BENZOCAINE-MENTHOL 20-0.5 % EX AERO
1.0000 "application " | INHALATION_SPRAY | CUTANEOUS | Status: DC | PRN
  Filled 2017-04-22: qty 56

## 2017-04-22 MED ORDER — BUTORPHANOL TARTRATE 1 MG/ML IJ SOLN: 1.0000 mg | INTRAMUSCULAR | Status: DC | PRN

## 2017-04-22 MED ORDER — DIPHENHYDRAMINE HCL 50 MG/ML IJ SOLN: 12.5000 mg | INTRAMUSCULAR | Status: DC | PRN

## 2017-04-22 MED ORDER — SERTRALINE HCL 100 MG PO TABS
100.0000 mg | ORAL_TABLET | Freq: Every day | ORAL | Status: DC
  Administered 2017-04-23 – 2017-04-24 (×2): 100 mg via ORAL
  Filled 2017-04-22 (×2): qty 1

## 2017-04-22 MED ORDER — OXYTOCIN 40 UNITS IN LACTATED RINGERS INFUSION - SIMPLE MED: 2.5000 [IU]/h | INTRAVENOUS | Status: DC

## 2017-04-22 MED ORDER — SIMETHICONE 80 MG PO CHEW
80.0000 mg | CHEWABLE_TABLET | ORAL | Status: DC | PRN
Start: 2017-04-22 — End: 2017-04-24

## 2017-04-22 MED ORDER — EPHEDRINE 5 MG/ML INJ
10.0000 mg | INTRAVENOUS | Status: DC | PRN
  Filled 2017-04-22: qty 2

## 2017-04-22 MED ORDER — ONDANSETRON HCL 4 MG PO TABS: 4.0000 mg | ORAL_TABLET | ORAL | Status: DC | PRN

## 2017-04-22 MED ORDER — TERBUTALINE SULFATE 1 MG/ML IJ SOLN
0.2500 mg | Freq: Once | INTRAMUSCULAR | Status: DC | PRN
  Filled 2017-04-22: qty 1

## 2017-04-22 MED ORDER — TETANUS-DIPHTH-ACELL PERTUSSIS 5-2.5-18.5 LF-MCG/0.5 IM SUSP: 0.5000 mL | Freq: Once | INTRAMUSCULAR | Status: DC

## 2017-04-22 MED ORDER — LACTATED RINGERS IV SOLN: 500.0000 mL | INTRAVENOUS | Status: DC | PRN

## 2017-04-22 MED ORDER — COCONUT OIL OIL: 1.0000 "application " | TOPICAL_OIL | Status: DC | PRN

## 2017-04-22 MED ORDER — SENNOSIDES-DOCUSATE SODIUM 8.6-50 MG PO TABS
2.0000 | ORAL_TABLET | ORAL | Status: DC
  Administered 2017-04-23: 2 via ORAL
  Filled 2017-04-22 (×2): qty 2

## 2017-04-22 MED ORDER — OXYTOCIN BOLUS FROM INFUSION: 500.0000 mL | Freq: Once | INTRAVENOUS | Status: DC

## 2017-04-22 MED ORDER — PRENATAL MULTIVITAMIN CH
1.0000 | ORAL_TABLET | Freq: Every day | ORAL | Status: DC
  Administered 2017-04-23 – 2017-04-24 (×2): 1 via ORAL
  Filled 2017-04-22: qty 1

## 2017-04-22 MED ORDER — ONDANSETRON HCL 4 MG/2ML IJ SOLN: 4.0000 mg | Freq: Four times a day (QID) | INTRAMUSCULAR | Status: DC | PRN

## 2017-04-22 MED ORDER — WITCH HAZEL-GLYCERIN EX PADS: 1.0000 "application " | MEDICATED_PAD | CUTANEOUS | Status: DC | PRN

## 2017-04-22 MED ORDER — PENICILLIN G POT IN DEXTROSE 60000 UNIT/ML IV SOLN: 3.0000 10*6.[IU] | INTRAVENOUS | Status: DC

## 2017-04-22 MED ORDER — IBUPROFEN 600 MG PO TABS
600.0000 mg | ORAL_TABLET | Freq: Four times a day (QID) | ORAL | Status: DC
  Administered 2017-04-23 – 2017-04-24 (×7): 600 mg via ORAL
  Filled 2017-04-22 (×6): qty 1

## 2017-04-22 MED ORDER — ACETAMINOPHEN 325 MG PO TABS
650.0000 mg | ORAL_TABLET | ORAL | Status: DC | PRN
Start: 2017-04-22 — End: 2017-04-24

## 2017-04-22 MED ORDER — ONDANSETRON HCL 4 MG/2ML IJ SOLN: 4.0000 mg | INTRAMUSCULAR | Status: DC | PRN

## 2017-04-22 MED ORDER — SOD CITRATE-CITRIC ACID 500-334 MG/5ML PO SOLN: 30.0000 mL | ORAL | Status: DC | PRN

## 2017-04-22 MED ORDER — PHENYLEPHRINE 40 MCG/ML (10ML) SYRINGE FOR IV PUSH (FOR BLOOD PRESSURE SUPPORT)
80.0000 ug | PREFILLED_SYRINGE | INTRAVENOUS | Status: DC | PRN
  Filled 2017-04-22: qty 10
  Filled 2017-04-22: qty 5

## 2017-04-22 MED ORDER — OXYTOCIN 40 UNITS IN LACTATED RINGERS INFUSION - SIMPLE MED
1.0000 m[IU]/min | INTRAVENOUS | Status: DC
  Administered 2017-04-22: 2 m[IU]/min via INTRAVENOUS
  Filled 2017-04-22: qty 1000

## 2017-04-22 MED ORDER — LIDOCAINE HCL (PF) 1 % IJ SOLN
30.0000 mL | INTRAMUSCULAR | Status: DC | PRN
  Administered 2017-04-22: 30 mL via SUBCUTANEOUS
  Filled 2017-04-22: qty 30

## 2017-04-22 MED ORDER — DEXTROSE 5 % IV SOLN
5.0000 10*6.[IU] | Freq: Once | INTRAVENOUS | Status: AC
  Administered 2017-04-22: 5 10*6.[IU] via INTRAVENOUS
  Filled 2017-04-22: qty 5

## 2017-04-22 NOTE — Anesthesia Procedure Notes (Signed)
Epidural Patient location during procedure: OB Start time: 04/22/2017 10:49 AM  Staffing Anesthesiologist: Karna ChristmasELLENDER, Araminta Zorn P Performed: anesthesiologist   Preanesthetic Checklist Completed: patient identified, site marked, pre-op evaluation, timeout performed, IV checked, risks and benefits discussed and monitors and equipment checked  Epidural Patient position: sitting Prep: DuraPrep Patient monitoring: heart rate, cardiac monitor, continuous pulse ox and blood pressure Approach: midline Location: L2-L3 Injection technique: LOR air  Needle:  Needle type: Tuohy  Needle gauge: 17 G Needle length: 9 cm Needle insertion depth: 5 cm Catheter type: closed end flexible Catheter size: 19 Gauge Catheter at skin depth: 10 cm Test dose: negative and Other  Assessment Events: blood not aspirated, injection not painful, no injection resistance and negative IV test  Additional Notes Informed consent obtained prior to proceeding including risk of failure, 1% risk of PDPH, risk of minor discomfort and bruising.  Discussed rare but serious complications.  Discussed alternatives to epidural analgesia and patient desires to proceed.  Timeout performed pre-procedure verifying patient name, procedure, and platelet count.  Patient tolerated procedure well. Loss of resistance encountered on the third attempt. Reason for block:procedure for pain

## 2017-04-22 NOTE — Anesthesia Preprocedure Evaluation (Signed)
Anesthesia Evaluation  Patient identified by MRN, date of birth, ID band Patient awake    Reviewed: Allergy & Precautions, H&P , Patient's Chart, lab work & pertinent test results  History of Anesthesia Complications Negative for: history of anesthetic complications  Airway Mallampati: II  TM Distance: >3 FB Neck ROM: full    Dental no notable dental hx. (+) Teeth Intact   Pulmonary neg pulmonary ROS,    Pulmonary exam normal breath sounds clear to auscultation       Cardiovascular negative cardio ROS Normal cardiovascular exam Rhythm:regular Rate:Normal     Neuro/Psych PSYCHIATRIC DISORDERS negative neurological ROS     GI/Hepatic negative GI ROS, Neg liver ROS,   Endo/Other  negative endocrine ROS  Renal/GU negative Renal ROS  negative genitourinary   Musculoskeletal   Abdominal   Peds  Hematology negative hematology ROS (+)   Anesthesia Other Findings Plt: 305  Reproductive/Obstetrics (+) Pregnancy                             Anesthesia Physical Anesthesia Plan  ASA: II  Anesthesia Plan: Epidural   Post-op Pain Management:    Induction:   PONV Risk Score and Plan:   Airway Management Planned:   Additional Equipment:   Intra-op Plan:   Post-operative Plan:   Informed Consent: I have reviewed the patients History and Physical, chart, labs and discussed the procedure including the risks, benefits and alternatives for the proposed anesthesia with the patient or authorized representative who has indicated his/her understanding and acceptance.     Plan Discussed with:   Anesthesia Plan Comments:         Anesthesia Quick Evaluation

## 2017-04-22 NOTE — MAU Note (Signed)
Patient presents with contractions 5 min apart and getting more painful. She was last seen in MAU 6/19 at 2200 and discharged home.  Denies problems with pregnancy.  Denies LOF.  Reports good fetal movement.

## 2017-04-22 NOTE — H&P (Signed)
Cheryl Acevedo is a 26 y.o. female presenting for painful contractions for past two days and now with cervical change. Her prenatal care is dated by a 7week US. She has had a benign prenatal course however anxiety in past few weeks has increased. Hx OCD/bipolar/ADHD/depression and anxiety - on latuda and zoloft.    OB History    Gravida Para Term Preterm AB Living   2       1     SAB TAB Ectopic Multiple Live Births     1           Past Medical History:  Diagnosis Date  . Anxiety   . Bipolar disorder (HCC)   . Depression   . Obsessive compulsive disorder   . Paranoia [facility](HCC)    Past Surgical History:  Procedure Laterality Date  . COSMETIC SURGERY    . WISDOM TOOTH EXTRACTION Bilateral 2000   x4   Family History: family history includes Cancer in her mother and other; Diabetes in her other. Social History:  reports that she has never smoked. She has never used smokeless tobacco. She reports that she does not drink alcohol or use drugs.     Maternal Diabetes: No Genetic Screening: Normal Maternal Ultrasounds/Referrals: Normal Fetal Ultrasounds or other Referrals:  None Maternal Substance Abuse:  No Significant Maternal Medications:  Meds include: Zoloft Other: latuda Significant Maternal Lab Results:  Lab values include: Group B Strep positive Other Comments:  None  Review of Systems  Constitutional: Positive for malaise/fatigue. Negative for chills, fever and weight loss.  Eyes: Negative for blurred vision.  Respiratory: Negative for shortness of breath.   Cardiovascular: Negative for chest pain and leg swelling.  Gastrointestinal: Positive for abdominal pain. Negative for heartburn, nausea and vomiting.  Genitourinary: Negative for dysuria.  Musculoskeletal: Positive for back pain.  Skin: Negative for itching and rash.  Neurological: Negative for dizziness and headaches.  Endo/Heme/Allergies: Does not bruise/bleed easily.  Psychiatric/Behavioral: Negative for depression,  hallucinations, substance abuse and suicidal ideas. The patient is nervous/anxious.    Maternal Medical History:  Reason for admission: Contractions.  Nausea.  Contractions: Onset was 2 days ago.   Frequency: regular.   Perceived severity is strong.    Fetal activity: Perceived fetal activity is normal.   Last perceived fetal movement was within the past hour.    Prenatal complications: no prenatal complications Prenatal Complications - Diabetes: none.    Dilation: 3 Effacement (%): 90 Station: 0 Exam by:: Abbie Polos, RN Blood pressure 124/78, pulse 87, temperature 97.5 F (36.4 C), temperature source Oral, resp. rate 18, height 5\' 10"  (1.778 m), weight 203 lb (92.1 kg), last menstrual period 07/20/2016. Maternal Exam:  Uterine Assessment: Contraction strength is moderate.  Contraction frequency is regular.   Abdomen: Patient reports generalized tenderness.  Estimated fetal weight is AGA.   Fetal presentation: vertex  Introitus: Normal vulva. Vulva is negative for condylomata, edema and lesion.  Normal vagina.  Vagina is negative for condylomata and discharge.  Pelvis: adequate for delivery.   Cervix: Cervix evaluated by digital exam.     Physical Exam  Constitutional: She is oriented to person, place, and time. She appears well-developed and well-nourished.  Neck: Normal range of motion.  Cardiovascular: Normal rate.   Respiratory: Effort normal.  GI: Soft. There is generalized tenderness.  Genitourinary: Vagina normal and uterus normal. Vulva exhibits no lesion. No vaginal discharge found.  Musculoskeletal: Normal range of motion.  Neurological: She is alert and oriented to person,  place, and time.  Skin: Skin is warm.  Psychiatric: She has a normal mood and affect. Her behavior is normal. Judgment and thought content normal.    Prenatal labs: ABO, Rh: --/--/A POS (06/20 0845) Antibody: PENDING (06/20 0845) Rubella: Immune (11/21 0000) RPR: Nonreactive (11/21  0000)  HBsAg: Negative (11/21 0000)  HIV: Non-reactive (11/21 0000)  GBS: Positive (05/23 0000)   Assessment/Plan: G2P0010 at 39 3/[redacted]wks gestation in active labor Pain control prn AROM when able PCN for treatment of GBS Anticipate svd   Cheryl Acevedo 04/22/2017, 9:52 AM

## 2017-04-22 NOTE — Anesthesia Pain Management Evaluation Note (Signed)
  CRNA Pain Management Visit Note  Patient: Cheryl Acevedo, 26 y.o., female  "Hello I am a member of the anesthesia team at [facility]. We have an anesthesia team available at all times to provide care throughout the hospital, including epidural management and anesthesia for C-section. I don't know your plan for the delivery whether it a natural birth, water birth, IV sedation, nitrous supplementation, doula or epidural, but we want to meet your pain goals."   1.Was your pain managed to your expectations on prior hospitalizations?   No prior hospitalizations  2.What is your expectation for pain management during this hospitalization?     Epidural  3.How can we help you reach that goal? epidual  Record the patient's initial score and the patient's pain goal.   Pain: 0  Pain Goal: 4 [facility] wants you to be able to say your pain was always managed very well.  Lylliana Kitamura 04/22/2017

## 2017-04-22 NOTE — Progress Notes (Signed)
I received a referral from pt's nurse due to a recent loss in pt's family and a significant mental health history.  Morrie Sheldonshley appeared calm and in good spirits.  She stated that her preacher had already been up there and offered prayer for her and their baby.  She was appreciative of the visit, but stated no particular needs at this time.  We will attempt follow up with patient after delivery, but please also page as needs arise.  Chaplain Dyanne CarrelKaty Avelardo Reesman, Bcc Pager, [phone] 3:57 PM    04/22/17 1500  Clinical Encounter Type  Visited With Patient and family together  Visit Type Initial  Referral From Nurse

## 2017-04-23 LAB — CBC
HCT: 23.9 % — ABNORMAL LOW (ref 36.0–46.0)
Hemoglobin: 8.4 g/dL — ABNORMAL LOW (ref 12.0–15.0)
MCH: 29.2 pg (ref 26.0–34.0)
MCHC: 35.1 g/dL (ref 30.0–36.0)
MCV: 83 fL (ref 78.0–100.0)
PLATELETS: 227 10*3/uL (ref 150–400)
RBC: 2.88 MIL/uL — AB (ref 3.87–5.11)
RDW: 13.7 % (ref 11.5–15.5)
WBC: 14.4 10*3/uL — ABNORMAL HIGH (ref 4.0–10.5)

## 2017-04-23 NOTE — Anesthesia Postprocedure Evaluation (Signed)
Anesthesia Post Note  Patient: Cheryl Acevedo  Procedure(s) Performed: * No procedures listed *     Patient location during evaluation: Mother Baby Anesthesia Type: Epidural Level of consciousness: awake and awake and alert Pain management: pain level controlled Vital Signs Assessment: post-procedure vital signs reviewed and stable Respiratory status: spontaneous breathing Cardiovascular status: stable Postop Assessment: no headache, no backache, epidural receding, patient able to bend at knees and no signs of nausea or vomiting Anesthetic complications: no    Last Vitals:  Vitals:   04/22/17 1930 04/22/17 2330  BP: 101/61 (!) 87/49  Pulse: (!) 104 79  Resp: 18 18  Temp: 36.8 C     Last Pain:  Vitals:   04/22/17 1820  TempSrc: Oral  PainSc:    Pain Goal:                 Edison PaceWILKERSON,Lilac Hoff

## 2017-04-23 NOTE — Progress Notes (Signed)
Post Partum Day 1 Subjective: no complaints and tolerating PO  Working on breastfeeding  Objective: Blood pressure (!) 103/59, pulse 95, temperature 97.8 F (36.6 C), temperature source Oral, resp. rate 16, height 5\' 10"  (1.778 m), weight 92.1 kg (203 lb), last menstrual period 07/20/2016, SpO2 99 %, unknown if currently breastfeeding.  Physical Exam:  General: alert and cooperative Lochia: appropriate Uterine Fundus: firm    Recent Labs  04/22/17 0845 04/23/17 0534  HGB 11.2* 8.4*  HCT 32.3* 23.9*    Assessment/Plan: Plan for discharge tomorrow   LOS: 1 day   Oliver PilaKathy W Keon Pender 04/23/2017, 10:07 AM

## 2017-04-24 MED ORDER — IBUPROFEN 600 MG PO TABS: 600.0000 mg | ORAL_TABLET | Freq: Four times a day (QID) | ORAL | 0 refills | Status: DC

## 2017-04-24 NOTE — Discharge Summary (Signed)
OB Discharge Summary     Patient Name: Cheryl Acevedo DOB: 05-27-1991 MRN: [id number]  Date of admission: 04/22/2017 Delivering MD: Pryor Ochoa [facility]   Date of discharge: 04/24/2017  Admitting diagnosis: LABOR Intrauterine pregnancy: [redacted]w[redacted]d     Secondary diagnosis:  Active Problems:   Normal labor   SVD (spontaneous vaginal delivery)   Postpartum care following vaginal delivery  Additional problems: OCD, depression, anxiety, ADHD     Discharge diagnosis: Term Pregnancy Delivered             Hospital course:  Onset of Labor With Vaginal Delivery     26 y.o. yo G2P1011 at [redacted]w[redacted]d was admitted in Active Labor on 04/22/2017. Patient had an uncomplicated labor course as follows:  Membrane Rupture Time/Date: 10:07 AM ,04/22/2017   Intrapartum Procedures: Episiotomy:                                           Lacerations:  Vaginal [6];2nd degree [3]  Patient had a delivery of a Viable infant. 04/22/2017  Information for the patient's newborn:  Aditi, Rovira [[id number]]  Delivery Method: Vaginal, Spontaneous Delivery (Filed from Delivery Summary)    Pateint had an uncomplicated postpartum course.  She is ambulating, tolerating a regular diet, passing flatus, and urinating well. Patient is discharged home in stable condition on 04/24/17.   Physical exam  Vitals:   04/22/17 2330 04/23/17 0750 04/23/17 1855 04/24/17 0610  BP: (!) 87/49 (!) 103/59 104/66 104/61  Pulse: 79 95 65 61  Resp: 18 16  18   Temp:  97.8 F (36.6 C) 98.2 F (36.8 C) 98.3 F (36.8 C)  TempSrc:  Oral Oral Oral  SpO2:  99%    Weight:      Height:       General: alert Lochia: appropriate Uterine Fundus: firm  Labs: Lab Results  Component Value Date   WBC 14.4 (H) 04/23/2017   HGB 8.4 (L) 04/23/2017   HCT 23.9 (L) 04/23/2017   MCV 83.0 04/23/2017   PLT 227 04/23/2017   CMP Latest Ref Rng & Units 02/29/2012  Glucose 70 - 99 mg/dL 086(V)  BUN 6 - 23 mg/dL 12  Creatinine 7.84 - 6.96 mg/dL 2.95   Sodium 284 - 132 mEq/L 139  Potassium 3.5 - 5.1 mEq/L 4.1  Chloride 96 - 112 mEq/L 103  CO2 19 - 32 mEq/L 27  Calcium 8.4 - 10.5 mg/dL 9.4  Total Protein 6.0 - 8.3 g/dL 7.3  Total Bilirubin 0.3 - 1.2 mg/dL 4.4(W)  Alkaline Phos 39 - 117 U/L 80  AST 0 - 37 U/L 59(H)  ALT 0 - 35 U/L 64(H)    Discharge instruction: per After Visit Summary and "Baby and Me Booklet".  After visit meds:  Allergies as of 04/24/2017   No Known Allergies     Medication List    TAKE these medications   ibuprofen 600 MG tablet Commonly known as:  ADVIL,MOTRIN Take 1 tablet (600 mg total) by mouth every 6 (six) hours.   LATUDA 20 MG Tabs tablet Generic drug:  lurasidone Take 20 mg by mouth daily.   prenatal multivitamin Tabs tablet Take 1 tablet by mouth daily at 12 noon.   sertraline 100 MG tablet Commonly known as:  ZOLOFT Take 100 mg by mouth daily.       Diet: routine diet  Activity: Advance as  tolerated. Pelvic rest for 6 weeks.   Outpatient follow up:3 weeks  Newborn Data: Live born female  Birth Weight: 7 lb 1.8 oz (3225 g) APGAR: 8, 9  Baby Feeding: Breast Disposition:home with mother   04/24/2017 Zenaida Nieceodd D Robertlee Rogacki, MD

## 2017-04-24 NOTE — Progress Notes (Signed)
Patient ID: Cheryl Acevedo, female   DOB: [birth year], 26 y.o.   MRN: [id number] PPD #2 Doing well Afeb, VSS Fundus firm D/c home

## 2017-04-24 NOTE — Clinical Social Work Maternal (Signed)
CLINICAL SOCIAL WORK MATERNAL/CHILD NOTE  Patient Details  Name: Cheryl Acevedo MRN: [id number] Date of Birth: 1991/06/06  Date:  04/24/2017  Clinical Social Worker Initiating Note:  Terri Piedra, [place] Date/ Time Initiated:  04/24/17/0930     Child's Name:  Rip Harbour Felts   Legal Guardian:  Other (Comment) (Parents: Wilford Sports and Alroy Dust Felts)   Need for Interpreter:  None   Date of Referral:  04/23/17     Reason for Referral:  Behavioral Health Issues, including SI    Referral Source:  [facility]   Address:  [address], [place],  [address]  Phone number:  [phone]   Household Members:  Significant Other   Natural Supports (not living in the home):  Friends, Immediate Family, Extended Family   Professional Supports: Therapist   Employment:     Type of Work: FOB works.   Education:      Museum/gallery curator Resources:  Medicaid   Other Resources:  [facility]   Cultural/Religious Considerations Which May Impact Care: None stated.  MOB's facesheet notes religion as [place].  Strengths:  Ability to meet basic needs , Pediatrician chosen , Home prepared for child  Conservation officer, nature)   Risk Factors/Current Problems:  Mental Health Concerns    Cognitive State:  Able to Concentrate , Alert , Linear Thinking , Insightful , Goal Oriented    Mood/Affect:  Calm , Comfortable , Interested    CSW Assessment: CSW met with parents and MOB's sister to offer support and complete assessment due to MOB's hx of Schizoaffective Disorder, Bipolar type and Anxiety.  MOB was extremely pleasant and welcoming and gave permission to speak openly with her family present.  FOB was engaged and appears supportive, however, was extremely quiet during assessment.  MOB's sister was involved in the conversation and showed support and concern for MOB.   MOB reports she is feeling well and that she thinks she felt more stable emotionally during pregnancy than when she is not pregnant.  CSW  normalized this feeling as many women often feel more stable given the influx of hormones.  MOB reports that she has a psychiatrist and receives medication management at the [facility].  She states she has been a patient there since she as 26 years old and suffered from severe OCD.  She states she has seen her mother's therapist on occasion and plans to seek counseling on a regular basis now as she acknowledges that she is at a heightened risk of developing PMADs given her mental health hx.  MOB reports she will not hesitate to reach out to her medical providers if she has concerns at any time.  MOB and her sister asked questions about what to expect and were engaged and attentive as CSW provided education regarding PMADs.  CSW provided MOB with a New Mom Checklist from Postpartum [employer] as a way to self evaluate during the postpartum time period.  MOB reports struggling at times with the negative stigma attached to mental illness, but is hopeful that people will continue to be educated on the subject and works hard to acknowledge that her mental health concerns were not her choice, are not her fault, and do not define her.  CSW commends her for this and for her willingness to speak openly about her mental health.  MOB is currently taking Zoloft and Latuda (switched to [place] from [place] in pregnancy) and feel these medications work well for her.  She notes that she was initially disappointed that she was  told she could not breastfeed while taking Latuda, and states she felt like she was "somehow less of a woman because I could not provide this (breast milk) for my daughter."  CSW processed these feelings with her and MOB reports feeling like she has accepted the situation and is content with formula feeding.  MOB reports having a great support system, and it is evident that her family is aware of her mental health concerns and MOB feels will support her throughout the postpartum time as they always do.  MOB  commented that she and her family have struggled to cope with the loss of their brother to suicide approximately 1.5 years ago and have been active in counseling because of this loss.   MOB states she has all necessary items for baby at home.  She was very attentive to information provided by CSW regarding SIDS precautions as CSW acknowledges that while some hypervigilance is healthy and normal in the beginning, putting a baby to bed can be anxiety provoking.  CSW encouraged parents to know follow the guidelines in putting their daughter to bed safely and then acknowledge that the rest is out of their control.  MOB asked appropriate questions about safe sleep and asked FOB if he was paying attention to the information given.  CSW also advised that there will always be times of frustration in parenting and informed parents to always put a baby down in a safe place in order to take a moment to collect themselves when feeling overly frustrated.  Parents stated understanding and MOB reports that this was talked about in her pregnancy classes.  She referenced her pregnancy classes multiple times and seems to have learned a lot in this setting.   MOB thanked CSW for talking with her about the importance of mental health awareness and monitoring in the postpartum time.  She seems to be appreciative of CSW's concern for her emotional wellbeing and for information provided.  CSW congratulated parents on their daughter's birth and thanked MOB again for speaking so openly with CSW today.     CSW Plan/Description:  Information/Referral to [employer] , No Further Intervention Required/No Barriers to Discharge, Patient/Family Education     Alphonzo Cruise, [place] 04/24/2017, 12:16 PM

## 2017-04-24 NOTE — Discharge Instructions (Signed)
As per discharge pamphlet °

## 2018-02-24 ENCOUNTER — Other Ambulatory Visit: Payer: Self-pay

## 2018-02-24 ENCOUNTER — Emergency Department (HOSPITAL_COMMUNITY): Payer: Commercial Managed Care - PPO

## 2018-02-24 ENCOUNTER — Emergency Department (HOSPITAL_COMMUNITY)
Admission: EM | Admit: 2018-02-24 | Discharge: 2018-02-24 | Disposition: A | Discharge status: Home / Self Care | Payer: Commercial Managed Care - PPO | Attending: Emergency Medicine | Admitting: Emergency Medicine

## 2018-02-24 ENCOUNTER — Encounter (HOSPITAL_COMMUNITY): Payer: Self-pay | Admitting: Emergency Medicine

## 2018-02-24 DIAGNOSIS — R0789 Other chest pain: Secondary | ICD-10-CM | POA: Diagnosis present

## 2018-02-24 DIAGNOSIS — M545 Low back pain: Secondary | ICD-10-CM | POA: Diagnosis not present

## 2018-02-24 DIAGNOSIS — Z79899 Other long term (current) drug therapy: Secondary | ICD-10-CM | POA: Diagnosis not present

## 2018-02-24 LAB — CBC
HEMATOCRIT: 36.4 % (ref 36.0–46.0)
Hemoglobin: 11.7 g/dL — ABNORMAL LOW (ref 12.0–15.0)
MCH: 25.1 pg — ABNORMAL LOW (ref 26.0–34.0)
MCHC: 32.1 g/dL (ref 30.0–36.0)
MCV: 78.1 fL (ref 78.0–100.0)
Platelets: 317 10*3/uL (ref 150–400)
RBC: 4.66 MIL/uL (ref 3.87–5.11)
RDW: 15.2 % (ref 11.5–15.5)
WBC: 15.3 10*3/uL — ABNORMAL HIGH (ref 4.0–10.5)

## 2018-02-24 LAB — BASIC METABOLIC PANEL
Anion gap: 8 (ref 5–15)
BUN: 10 mg/dL (ref 6–20)
CO2: 23 mmol/L (ref 22–32)
CREATININE: 0.68 mg/dL (ref 0.44–1.00)
Calcium: 8.9 mg/dL (ref 8.9–10.3)
Chloride: 107 mmol/L (ref 101–111)
GFR calc Af Amer: >60 mL/min (ref >60)
Glucose, Bld: 122 mg/dL — ABNORMAL HIGH (ref 65–99)
POTASSIUM: 3.6 mmol/L (ref 3.5–5.1)
Sodium: 138 mmol/L (ref 135–145)

## 2018-02-24 LAB — I-STAT TROPONIN, ED: Troponin i, poc: 0 ng/mL (ref 0.00–0.08)

## 2018-02-24 LAB — I-STAT BETA HCG BLOOD, ED (MC, WL, AP ONLY): I-stat hCG, quantitative: <5 m[IU]/mL (ref <5)

## 2018-02-24 NOTE — ED Triage Notes (Signed)
Pt complaint of intermittent chest pain with "lock jaw" and tingling in extremities since Saturday; "feels like someone is hitting me with a sledge hammer." Denies at present.

## 2018-02-24 NOTE — ED Provider Notes (Signed)
[facility]-EMERGENCY DEPT Provider Note  CSN: [id number] Arrival date & time: 02/24/18 1319  Chief Complaint(s) Chest Pain  HPI Cheryl Acevedo is a 27 y.o. female   The history is provided by the patient.  Chest Pain   This is a new problem. Episode frequency: Periodic and intermittent. The problem has been resolved. The pain is associated with rest. The pain is present in the lateral region. The pain is severe. The quality of the pain is described as brief, sharp and stabbing. The pain does not radiate. Episode Length: Few seconds. Associated symptoms include back pain. Pertinent negatives include no cough, no diaphoresis, no fever, no irregular heartbeat, no leg pain, no lower extremity edema, no malaise/fatigue, no nausea, no shortness of breath and no vomiting. She has tried nothing for the symptoms.  Pertinent negatives for past medical history include no arrhythmia, no CAD, no congenital heart disease, no DVT, no hyperlipidemia, no hypertension, no MI, no PE, no strokes and no TIA.    Past Medical History Past Medical History:  Diagnosis Date  . Anxiety   . Bipolar disorder (HCC)   . Depression   . Obsessive compulsive disorder   . Paranoia [facility])    Patient Active Problem List   Diagnosis Date Noted  . Normal labor 04/22/2017  . SVD (spontaneous vaginal delivery) 04/22/2017  . Postpartum care following vaginal delivery 04/22/2017  . Schizoaffective disorder, bipolar type (HCC) 02/20/2012  . Major depressive disorder, recurrent (HCC) 02/17/2012  . OCD (obsessive compulsive disorder) 02/17/2012  . Generalized anxiety disorder 02/17/2012   Home Medication(s) Prior to Admission medications   Medication Sig Start Date End Date Taking? Authorizing Provider  ibuprofen (ADVIL,MOTRIN) 600 MG tablet Take 1 tablet (600 mg total) by mouth every 6 (six) hours. 04/24/17   Meisinger, Tawanna Cooler, MD  lurasidone (LATUDA) 20 MG TABS tablet Take 20 mg by mouth daily.     [provider]  Prenatal Vit-Fe Fumarate-FA (PRENATAL MULTIVITAMIN) TABS tablet Take 1 tablet by mouth daily at 12 noon.    [provider]  sertraline (ZOLOFT) 100 MG tablet Take 100 mg by mouth daily.    [provider]                                                                                                                                    Past Surgical History Past Surgical History:  Procedure Laterality Date  . COSMETIC SURGERY    . WISDOM TOOTH EXTRACTION Bilateral 2000   x4   Family History Family History  Problem Relation Age of Onset  . Cancer Mother   . Diabetes Other   . Cancer Other     Social History Social History   Tobacco Use  . Smoking status: Never Smoker  . Smokeless tobacco: Never Used  Substance Use Topics  . Alcohol use: No    Comment: Occasional  . Drug use: No  Allergies Patient has no known allergies.  Review of Systems Review of Systems  Constitutional: Negative for diaphoresis, fever and malaise/fatigue.  Respiratory: Negative for cough and shortness of breath.   Cardiovascular: Positive for chest pain.  Gastrointestinal: Negative for nausea and vomiting.  Musculoskeletal: Positive for back pain.   All other systems are reviewed and are negative for acute change except as noted in the HPI  Physical Exam Vital Signs  I have reviewed the triage vital signs BP 114/78   Pulse 92   Temp 98.1 F (36.7 C) (Oral)   Resp (!) 22   Ht 5\' 11"  (1.803 m)   Wt 85.7 kg (189 lb)   LMP 02/14/2018 Comment: Nexplanon  SpO2 100%   BMI 26.36 kg/m   Physical Exam  Constitutional: She is oriented to person, place, and time. She appears well-developed and well-nourished. No distress.  HENT:  Head: Normocephalic and atraumatic.  Nose: Nose normal.  Eyes: Pupils are equal, round, and reactive to light. Conjunctivae and EOM are normal. Right eye exhibits no discharge. Left eye exhibits no discharge. No scleral icterus.   Neck: Normal range of motion. Neck supple.  Cardiovascular: Normal rate and regular rhythm. Exam reveals no gallop and no friction rub.  No murmur heard. Pulmonary/Chest: Effort normal and breath sounds normal. No stridor. No respiratory distress. She has no rales. She exhibits tenderness.    Abdominal: Soft. She exhibits no distension. There is no tenderness.  Musculoskeletal: She exhibits no edema or tenderness.       Cervical back: She exhibits spasm.       Back:  Neurological: She is alert and oriented to person, place, and time.  Skin: Skin is warm and dry. No rash noted. She is not diaphoretic. No erythema.  Psychiatric: She has a normal mood and affect.  Vitals reviewed.   ED Results and Treatments Labs (all labs ordered are listed, but only abnormal results are displayed) Labs Reviewed  BASIC METABOLIC PANEL - Abnormal; Notable for the following components:      Result Value   Glucose, Bld 122 (*)    All other components within normal limits  CBC - Abnormal; Notable for the following components:   WBC 15.3 (*)    Hemoglobin 11.7 (*)    MCH 25.1 (*)    All other components within normal limits  I-STAT TROPONIN, ED  I-STAT BETA HCG BLOOD, ED ([facility], [facility], [facility] ONLY)                                                                                                                         EKG  EKG Interpretation  Date/Time:  Wednesday February 24 2018 14:00:17 EDT Ventricular Rate:  102 PR Interval:    QRS Duration: 98 QT Interval:  323 QTC Calculation: 421 R Axis:   -48 Text Interpretation:  Sinus tachycardia Left axis deviation Borderline T abnormalities, anterior leads No significant change since last tracing Confirmed by Drema Pry [phone]) on 02/24/2018  8:27:42 PM      Radiology Dg Chest 2 View  Result Date: 02/24/2018 CLINICAL DATA:  Chest pain EXAM: CHEST - 2 VIEW COMPARISON:  None. FINDINGS: There is no edema or consolidation. The heart size and pulmonary  vascularity are normal. No adenopathy. There is mild midthoracic dextroscoliosis. IMPRESSION: No edema or consolidation. Electronically Signed   By: Bretta BangWilliam  Woodruff III M.D.   On: 02/24/2018 14:38   Pertinent labs & imaging results that were available during my care of the patient were reviewed by me and considered in my medical decision making (see chart for details).  Medications Ordered in ED Medications - No data to display                                                                                                                                  Procedures Procedures  (including critical care time)  Medical Decision Making / ED Course I have reviewed the nursing notes for this encounter and the patient's prior records (if available in EHR or on provided paperwork).    Highly atypical chest pain not consistent with ACS.  EKG without acute ischemic changes or evidence of pericarditis.  Initial troponin obtained in triage negative.  I have low suspicion for ACS or cardiac etiology thus no further work-up necessary at this time.  Low pretest probability for pulmonary embolism.  Presentation not classic for aortic dissection or esophageal perforation.  Chest x-ray without evidence suggestive of pneumonia, pneumothorax, pneumomediastinum.  No abnormal contour of the mediastinum to suggest dissection. No evidence of acute injuries.  The patient appears reasonably screened and/or stabilized for discharge and I doubt any other medical condition or other [facility] requiring further screening, evaluation, or treatment in the ED at this time prior to discharge.  The patient is safe for discharge with strict return precautions.   Final Clinical Impression(s) / ED Diagnoses Final diagnoses:  Chest wall pain   Disposition: Discharge  Condition: Good  I have discussed the results, Dx and Tx plan with the patient who expressed understanding and agree(s) with the plan. Discharge instructions  discussed at great length. The patient was given strict return precautions who verbalized understanding of the instructions. No further questions at time of discharge.    ED Discharge Orders    None       Follow Up: Primary care provider         This chart was dictated using voice recognition software.  Despite best efforts to proofread,  errors can occur which can change the documentation meaning.   Nira Connardama, Clinton Wahlberg Eduardo, MD 02/24/18 2045

## 2018-06-16 IMAGING — CR DG CHEST 2V
2 series · 2 of 2 positions shown · non-contrast
Comparison: None.

CLINICAL DATA: Chest pain

EXAM:
CHEST - 2 VIEW

[w chest pa]
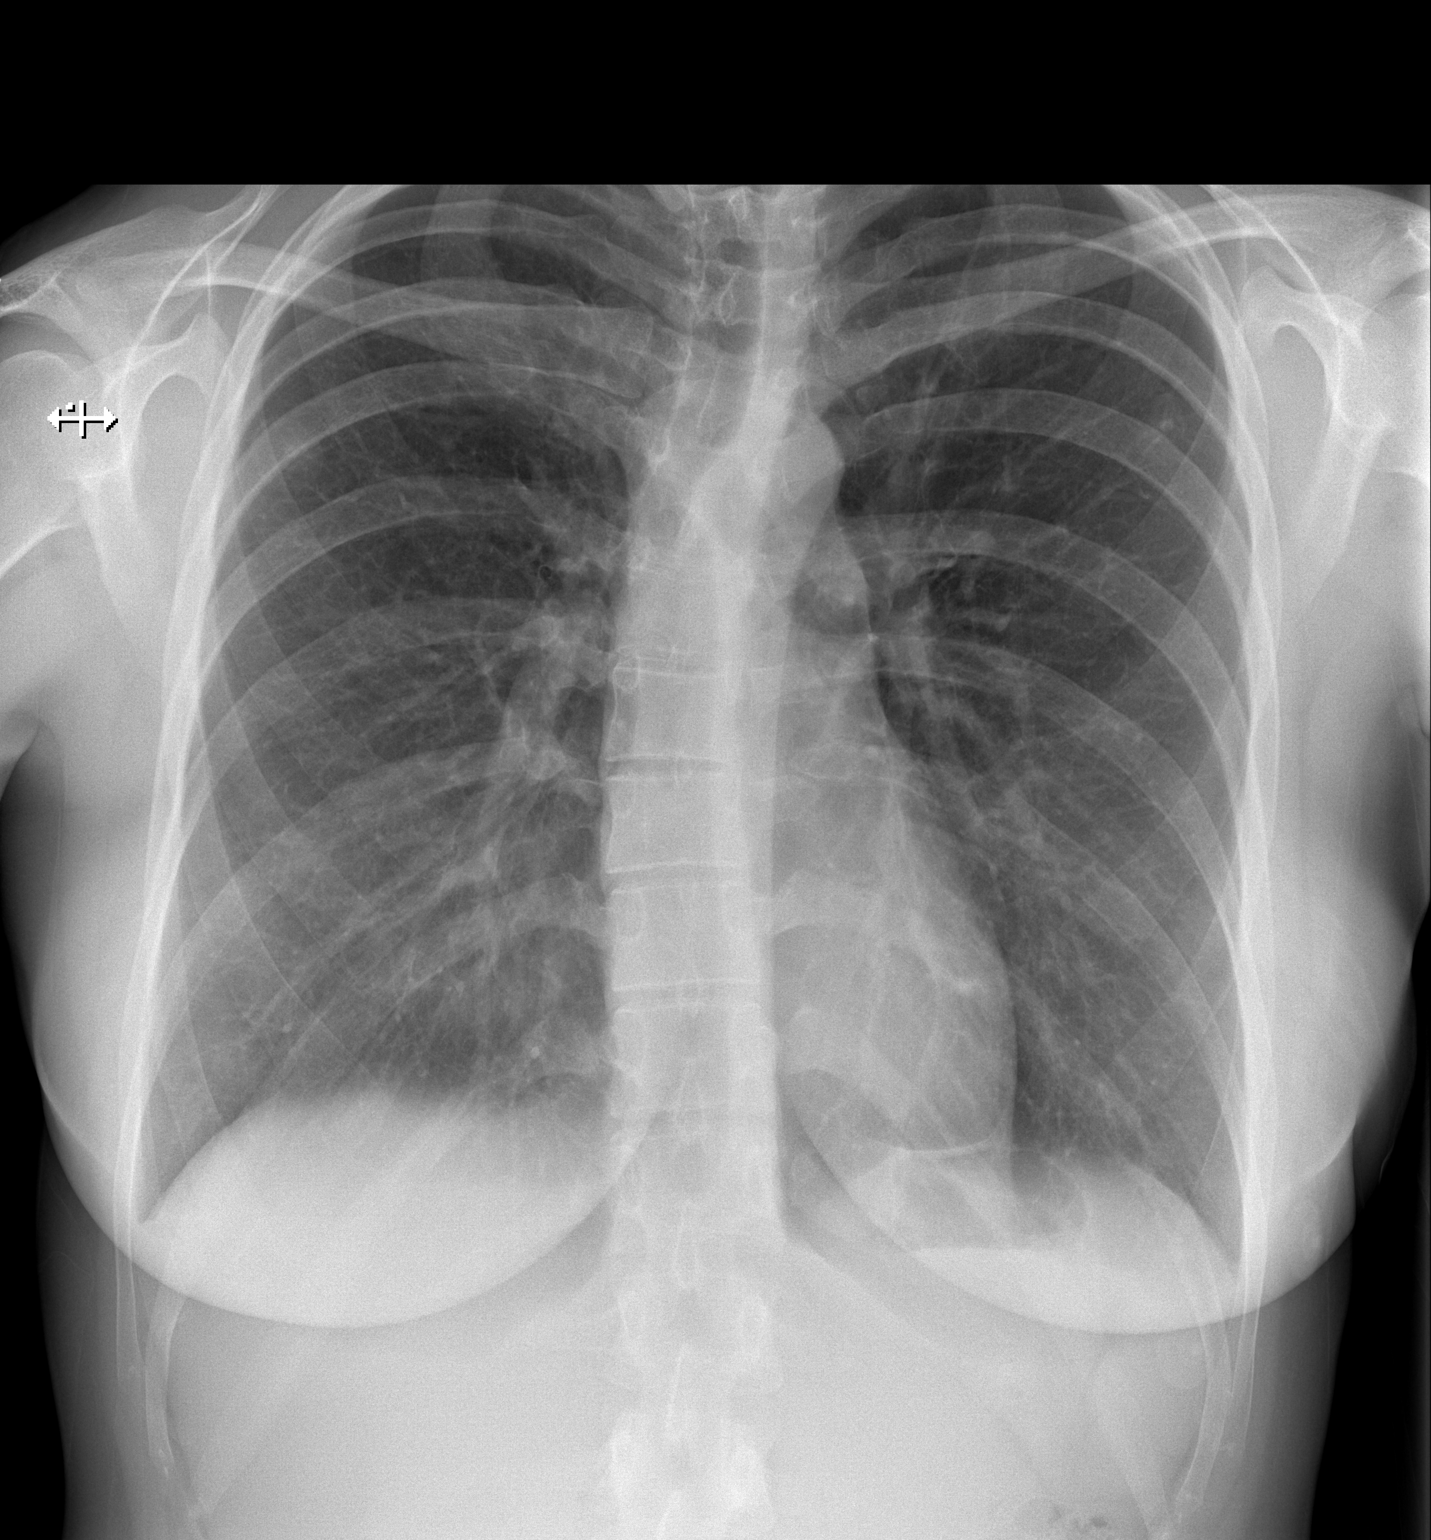

[w chest lat]
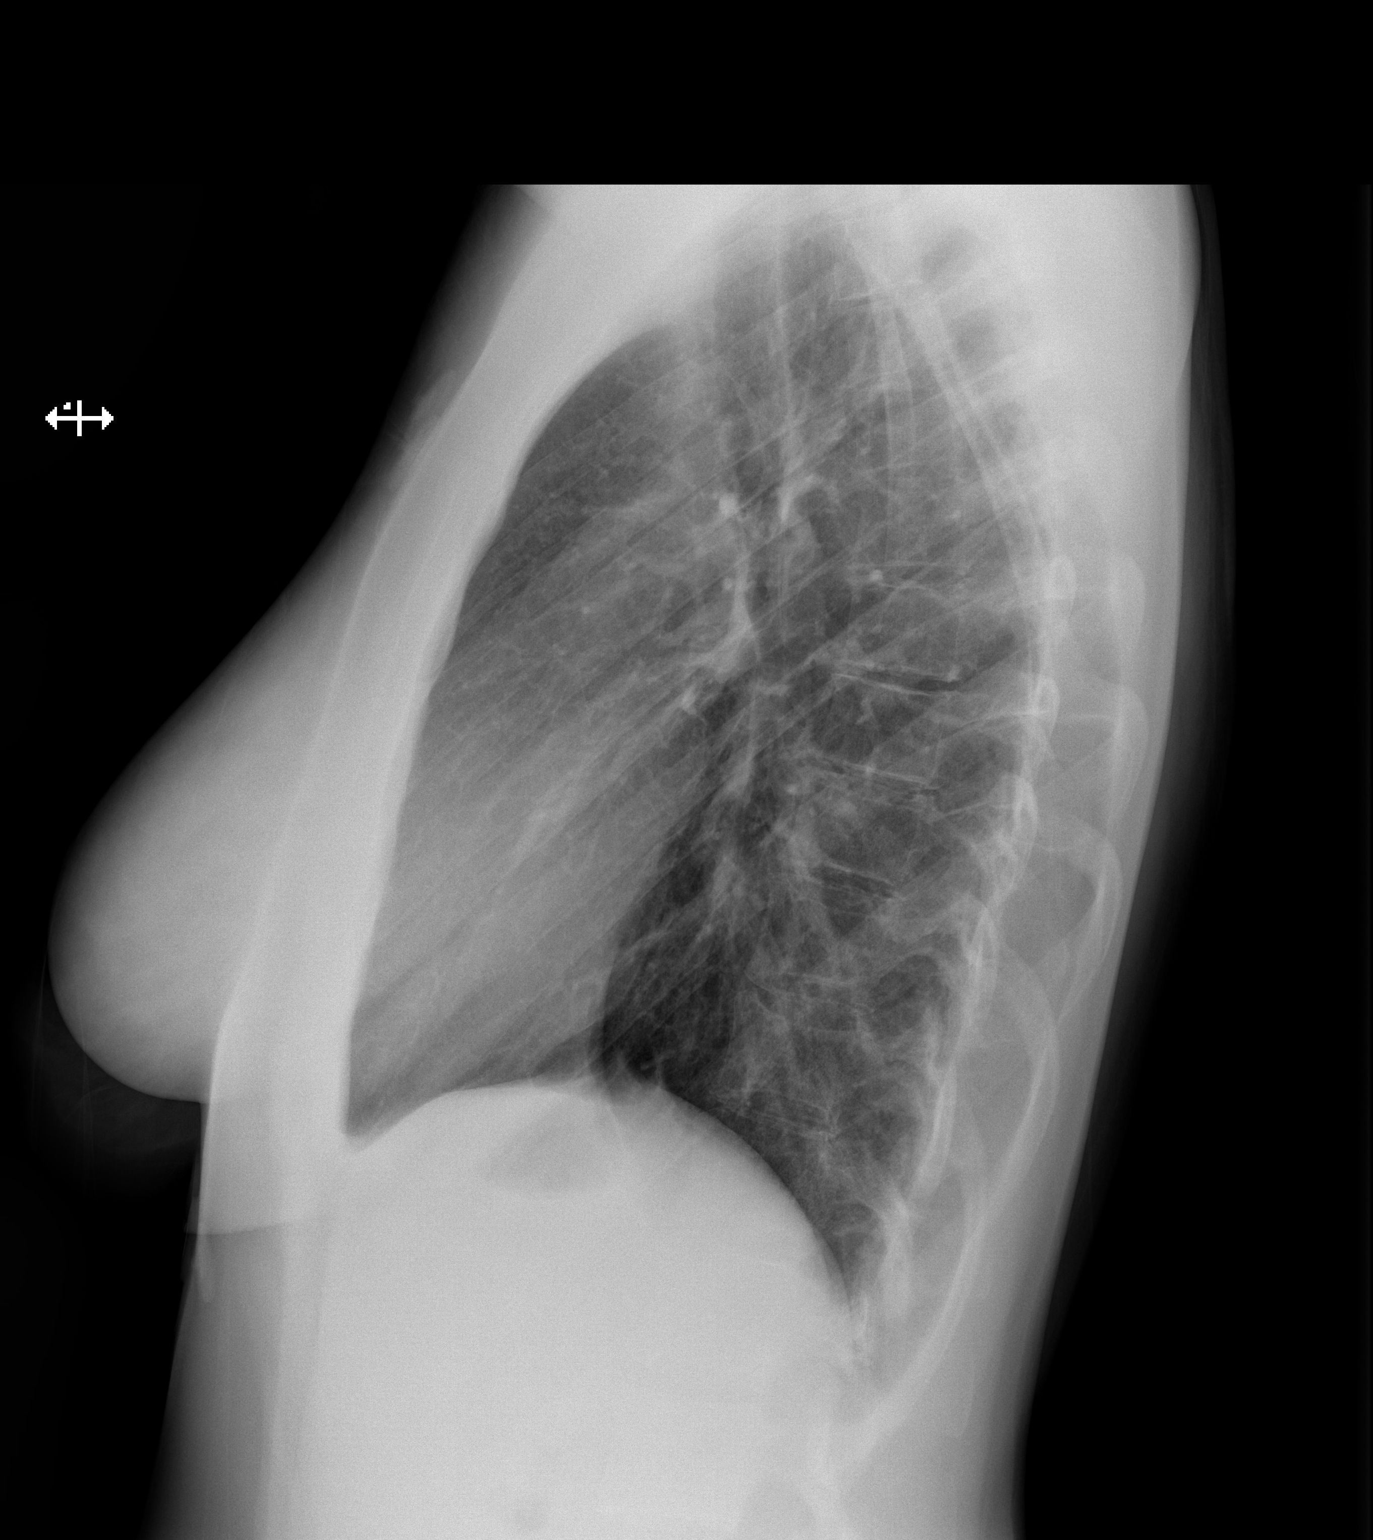

[2 of 2 positions shown; findings below may reference images not displayed]

FINDINGS: There is no edema or consolidation. The heart size and pulmonary
vascularity are normal. No adenopathy. There is mild midthoracic
dextroscoliosis.
IMPRESSION: No edema or consolidation.

## 2019-06-18 ENCOUNTER — Emergency Department (HOSPITAL_COMMUNITY): Payer: Medicaid Other

## 2019-06-18 ENCOUNTER — Encounter (HOSPITAL_COMMUNITY): Payer: Self-pay

## 2019-06-18 ENCOUNTER — Other Ambulatory Visit: Payer: Self-pay

## 2019-06-18 ENCOUNTER — Emergency Department (HOSPITAL_COMMUNITY)
Admission: EM | Admit: 2019-06-18 | Discharge: 2019-06-18 | Disposition: A | Discharge status: Home / Self Care | Payer: Medicaid Other | Attending: Emergency Medicine | Admitting: Emergency Medicine

## 2019-06-18 DIAGNOSIS — Y9383 Activity, rough housing and horseplay: Secondary | ICD-10-CM | POA: Diagnosis not present

## 2019-06-18 DIAGNOSIS — S8992XA Unspecified injury of left lower leg, initial encounter: Secondary | ICD-10-CM | POA: Diagnosis present

## 2019-06-18 DIAGNOSIS — Y999 Unspecified external cause status: Secondary | ICD-10-CM | POA: Insufficient documentation

## 2019-06-18 DIAGNOSIS — X509XXA Other and unspecified overexertion or strenuous movements or postures, initial encounter: Secondary | ICD-10-CM | POA: Diagnosis not present

## 2019-06-18 DIAGNOSIS — Y929 Unspecified place or not applicable: Secondary | ICD-10-CM | POA: Diagnosis not present

## 2019-06-18 DIAGNOSIS — Z79899 Other long term (current) drug therapy: Secondary | ICD-10-CM | POA: Insufficient documentation

## 2019-06-18 DIAGNOSIS — S8392XA Sprain of unspecified site of left knee, initial encounter: Secondary | ICD-10-CM | POA: Insufficient documentation

## 2019-06-18 NOTE — ED Triage Notes (Signed)
Pt reports that her L knee buckled while she was wrestling with her fiance. States that the knee feels better when bent.

## 2019-06-18 NOTE — ED Provider Notes (Signed)
[facility] DEPT Provider Note   CSN: [id number] Arrival date & time: 06/18/19  0009     History   Chief Complaint Chief Complaint  Patient presents with  . Knee Pain    L    HPI Cheryl Acevedo is a 28 y.o. female.     The history is provided by the patient.  Knee Pain Location:  Knee Knee location:  L knee Pain details:    Quality:  Aching   Radiates to:  Does not radiate   Severity:  Moderate   Onset quality:  Sudden   Timing:  Constant   Progression:  Unchanged Chronicity:  New Relieved by:  Elevation Worsened by:  Bearing weight Associated symptoms: no back pain, no fever and no neck pain   Patient with history of anxiety presents with knee pain.  She reports she was wrestling with her fianc when she heard her knee pop and it buckled.  No other acute complaints Denies any head or neck injury.  Past Medical History:  Diagnosis Date  . Anxiety   . Bipolar disorder ([place])   . Depression   . Obsessive compulsive disorder   . Paranoia [facility])     Patient Active Problem List   Diagnosis Date Noted  . Normal labor 04/22/2017  . SVD (spontaneous vaginal delivery) 04/22/2017  . Postpartum care following vaginal delivery 04/22/2017  . Schizoaffective disorder, bipolar type ([place]) 02/20/2012  . Major depressive disorder, recurrent ([place]) 02/17/2012  . OCD (obsessive compulsive disorder) 02/17/2012  . Generalized anxiety disorder 02/17/2012    Past Surgical History:  Procedure Laterality Date  . COSMETIC SURGERY    . WISDOM TOOTH EXTRACTION Bilateral 2000   x4     OB History    Gravida  2   Para  1   Term  1   Preterm      AB  1   Living  1     SAB      TAB  1   Ectopic      Multiple  0   Live Births  1            Home Medications    Prior to Admission medications   Medication Sig Start Date End Date Taking? Authorizing Provider  ibuprofen (ADVIL,MOTRIN) 600 MG tablet Take 1 tablet (600 mg total) by  mouth every 6 (six) hours. 04/24/17   Meisinger, Sherren Mocha, MD  lurasidone (LATUDA) 20 MG TABS tablet Take 20 mg by mouth daily.    [provider]  Prenatal Vit-Fe Fumarate-FA (PRENATAL MULTIVITAMIN) TABS tablet Take 1 tablet by mouth daily at 12 noon.    [provider]  sertraline (ZOLOFT) 100 MG tablet Take 100 mg by mouth daily.    [provider]    Family History Family History  Problem Relation Age of Onset  . Cancer Mother   . Diabetes Other   . Cancer Other     Social History Social History   Tobacco Use  . Smoking status: Never Smoker  . Smokeless tobacco: Never Used  Substance Use Topics  . Alcohol use: No    Comment: Occasional  . Drug use: No     Allergies   Patient has no known allergies.   Review of Systems Review of Systems  Constitutional: Negative for fever.  Musculoskeletal: Positive for arthralgias. Negative for back pain and neck pain.  Neurological: Negative for headaches.  All other systems reviewed and are negative.  Physical Exam Updated Vital Signs BP 119/72 (BP Location: Right Arm)   Pulse 82   Temp 98.2 F (36.8 C) (Oral)   Resp 18   SpO2 100%   Physical Exam CONSTITUTIONAL: Well developed/well nourished HEAD: Normocephalic/atraumatic EYES: EOMI/PERRL ENMT: Mucous membranes moist NECK: supple no meningeal signs SPINE/BACK: No cervical spine tenderness CV: S1/S2 noted, no murmurs/rubs/gallops noted LUNGS: Lungs are clear to auscultation bilaterally, no apparent distress ABDOMEN: soft, nontender NEURO: Pt is awake/alert/appropriate, moves all extremitiesx4.  No facial droop.   EXTREMITIES: pulses normal/equal, full ROM, mild tenderness noted to left knee.  No deformities.  She is able to flex and extend the knee.  Distal pulses intact.  All other extremities/joints palpated/ranged and nontender SKIN: warm, color normal PSYCH: no abnormalities of mood noted, alert and oriented to situation   ED  Treatments / Results  Labs (all labs ordered are listed, but only abnormal results are displayed) Labs Reviewed - No data to display  EKG None  Radiology Dg Knee Complete 4 Views Left  Result Date: 06/18/2019 CLINICAL DATA:  Left knee pain after injury. EXAM: LEFT KNEE - COMPLETE 4+ VIEW COMPARISON:  None. FINDINGS: No evidence of fracture or dislocation. No evidence of arthropathy or other focal bone abnormality. Trace joint effusion. Soft tissues are unremarkable. IMPRESSION: Trace joint effusion. No acute osseous abnormality. Electronically Signed   By: Narda RutherfordMelanie  Sanford M.D.   On: 06/18/2019 01:41    Procedures Procedures   Medications Ordered in ED Medications - No data to display   Initial Impression / Assessment and Plan / ED Course  I have reviewed the triage vital signs and the nursing notes.  Pertinent   imaging results that were available during my care of the patient were reviewed by me and considered in my medical decision making (see chart for details).        12:59 AM Pt declines pain meds Imaging pending 1:57 AM X-ray negative.  Will refer to Ortho if pain persists after 1 to 2 weeks Final Clinical Impressions(s) / ED Diagnoses   Final diagnoses:  Sprain of left knee, unspecified ligament, initial encounter    ED Discharge Orders    None       Zadie RhineWickline, Aris Even, MD 06/18/19 0157

## 2019-09-07 LAB — OB RESULTS CONSOLE GC/CHLAMYDIA
Chlamydia: NEGATIVE
Gonorrhea: NEGATIVE

## 2019-09-09 LAB — OB RESULTS CONSOLE RUBELLA ANTIBODY, IGM: Rubella: IMMUNE

## 2019-09-09 LAB — OB RESULTS CONSOLE HEPATITIS B SURFACE ANTIGEN: Hepatitis B Surface Ag: NEGATIVE

## 2019-09-09 LAB — OB RESULTS CONSOLE VARICELLA ZOSTER ANTIBODY, IGG: Varicella: IMMUNE

## 2019-09-09 LAB — OB RESULTS CONSOLE RPR: RPR: NONREACTIVE

## 2019-11-04 NOTE — L&D Delivery Note (Signed)
Delivery Note Pt pushed very well with several contractions for delivery.  At 11:10 AM a viable and healthy female was delivered via Vaginal, Spontaneous (Presentation:   Occiput Anterior).  APGAR: 8, 7; weight P .   Placenta status: Spontaneous, Intact.  Cord: 3 vessels with the following complications: None.    Anesthesia: Epidural Episiotomy: None Lacerations: R labial laceration Suture Repair: 3.0 vicryl rapide Est. Blood Loss (mL): 242cc  Mom to postpartum.  Baby to Couplet care / Skin to Skin.  Cheryl Acevedo 03/26/2020, 11:55 AM  A+/RI/Breast/BTL/Tdap 3/18  D/w pt circumcision for female infant including r/b/a   D/W pt BTL inc r/b/a - will proceed when OR available.

## 2019-11-29 ENCOUNTER — Inpatient Hospital Stay (HOSPITAL_COMMUNITY)
Admission: AD | Admit: 2019-11-29 | Discharge: 2019-11-29 | Disposition: A | Discharge status: Home / Self Care | Payer: Medicaid Other | Attending: Obstetrics and Gynecology | Admitting: Obstetrics and Gynecology

## 2019-11-29 ENCOUNTER — Other Ambulatory Visit: Payer: Self-pay

## 2019-11-29 ENCOUNTER — Encounter (HOSPITAL_COMMUNITY): Payer: Self-pay

## 2019-11-29 DIAGNOSIS — O26899 Other specified pregnancy related conditions, unspecified trimester: Secondary | ICD-10-CM

## 2019-11-29 DIAGNOSIS — R109 Unspecified abdominal pain: Secondary | ICD-10-CM | POA: Diagnosis present

## 2019-11-29 DIAGNOSIS — O26892 Other specified pregnancy related conditions, second trimester: Secondary | ICD-10-CM | POA: Diagnosis not present

## 2019-11-29 DIAGNOSIS — F419 Anxiety disorder, unspecified: Secondary | ICD-10-CM | POA: Diagnosis not present

## 2019-11-29 DIAGNOSIS — F319 Bipolar disorder, unspecified: Secondary | ICD-10-CM | POA: Insufficient documentation

## 2019-11-29 DIAGNOSIS — O99342 Other mental disorders complicating pregnancy, second trimester: Secondary | ICD-10-CM | POA: Insufficient documentation

## 2019-11-29 DIAGNOSIS — R102 Pelvic and perineal pain: Secondary | ICD-10-CM | POA: Diagnosis not present

## 2019-11-29 DIAGNOSIS — Z3A21 21 weeks gestation of pregnancy: Secondary | ICD-10-CM | POA: Diagnosis not present

## 2019-11-29 DIAGNOSIS — N949 Unspecified condition associated with female genital organs and menstrual cycle: Secondary | ICD-10-CM

## 2019-11-29 LAB — URINALYSIS, ROUTINE W REFLEX MICROSCOPIC
Bilirubin Urine: NEGATIVE
Glucose, UA: NEGATIVE mg/dL
Hgb urine dipstick: NEGATIVE
Ketones, ur: NEGATIVE mg/dL
Leukocytes,Ua: NEGATIVE
Nitrite: NEGATIVE
Protein, ur: NEGATIVE mg/dL
Specific Gravity, Urine: 1.009 (ref 1.005–1.030)
pH: 8 (ref 5.0–8.0)

## 2019-11-29 NOTE — MAU Note (Signed)
Patient reports some abdominal cramps that feel like a twisting, period-cramp sensation that occurs every few minutes for the past 2 hours.  Denies VB/LOF.  Has not taken anything for her pain.  Endorses + FM.

## 2019-11-29 NOTE — MAU Provider Note (Signed)
Chief Complaint:  Abdominal Pain   First Provider Initiated Contact with Patient 11/29/19 2323     HPI: Cheryl Acevedo is a 29 y.o. G3P1011 at 41w4dwho presents to maternity admissions reporting pelvic cramping, mostly just to right of center.  Doesn't really feel tightening.  Feels like it is twisting and cramping.  Has been going on for a couple of hours. No history of preterm labor.  Pregnancy course has been uncomplicated thus far. She reports good fetal movement, denies LOF, vaginal bleeding, vaginal itching/burning, urinary symptoms, h/a, dizziness, n/v, diarrhea, constipation or fever/chills.    Abdominal Pain This is a new problem. The current episode started today. The onset quality is gradual. The problem occurs intermittently. The problem has been unchanged. The pain is located in the RLQ and suprapubic region. The pain is mild. The quality of the pain is cramping. The abdominal pain does not radiate. Pertinent negatives include no constipation, diarrhea, dysuria, fever, headaches, hematuria, myalgias, nausea or vomiting. Nothing aggravates the pain. The pain is relieved by nothing. She has tried nothing for the symptoms.    RN Note: Patient reports some abdominal cramps that feel like a twisting, period-cramp sensation that occurs every few minutes for the past 2 hours.  Denies VB/LOF.  Has not taken anything for her pain.  Endorses + FM   Past Medical History: Past Medical History:  Diagnosis Date  . Anxiety   . Bipolar disorder (HCC)   . Depression   . Obsessive compulsive disorder   . Paranoia (HCC)   . Scoliosis     Past obstetric history: OB History  Gravida Para Term Preterm AB Living  3 1 1   1 1   SAB TAB Ectopic Multiple Live Births    1   0 1    # Outcome Date GA Lbr Len/2nd Weight Sex Delivery Anes PTL Lv  3 Current           2 Term 04/22/17 [redacted]w[redacted]d 06:58 / 01:15 3225 g F Vag-Spont EPI  LIV  1 TAB             Past Surgical History: Past Surgical History:   Procedure Laterality Date  . COSMETIC SURGERY    . WISDOM TOOTH EXTRACTION Bilateral 2000   x4    Family History: Family History  Problem Relation Age of Onset  . Cancer Mother   . Diabetes Other   . Cancer Other     Social History: Social History   Tobacco Use  . Smoking status: Never Smoker  . Smokeless tobacco: Never Used  Substance Use Topics  . Alcohol use: No    Comment: Occasional  . Drug use: No    Allergies: No Known Allergies  Meds:  Medications Prior to Admission  Medication Sig Dispense Refill Last Dose  . acetaminophen (TYLENOL) 325 MG tablet Take 650 mg by mouth every 6 (six) hours as needed.   11/29/2019 at Unknown time  . sertraline (ZOLOFT) 100 MG tablet Take 100 mg by mouth daily.   11/29/2019 at Unknown time  . ARIPiprazole ER (ABILIFY MAINTENA) 400 MG PRSY prefilled syringe Inject 400 mg into the muscle every 28 (twenty-eight) days.      12/01/2019 ibuprofen (ADVIL,MOTRIN) 600 MG tablet Take 1 tablet (600 mg total) by mouth every 6 (six) hours. (Patient not taking: Reported on 06/18/2019) 30 tablet 0   . lisdexamfetamine (VYVANSE) 30 MG capsule Take 30 mg by mouth daily.        I have  reviewed patient's Past Medical Hx, Surgical Hx, Family Hx, Social Hx, medications and allergies.   ROS:  Review of Systems  Constitutional: Negative for fever.  Gastrointestinal: Positive for abdominal pain. Negative for constipation, diarrhea, nausea and vomiting.  Genitourinary: Negative for dysuria and hematuria.  Musculoskeletal: Negative for myalgias.  Neurological: Negative for headaches.   Other systems negative  Physical Exam   Patient Vitals for the past 24 hrs:  BP Temp Pulse Resp Height Weight  11/29/19 2302 118/73 98.4 F (36.9 C) 83 19 5\' 11"  (1.803 m) 89.5 kg   Constitutional: Well-developed, well-nourished female in no acute distress.  Cardiovascular: normal rate and rhythm Respiratory: normal effort, clear to auscultation bilaterally GI: Abd soft,  non-tender, gravid appropriate for gestational age.   No rebound or guarding. MS: Extremities nontender, no edema, normal ROM Neurologic: Alert and oriented x 4.  GU: Neg CVAT.  PELVIC EXAM:  Dilation: Closed Effacement (%): Thick Cervical Position: Posterior Exam by:: Hansel Feinstein, CNM  FHT:  150   Labs: Results for orders placed or performed during the hospital encounter of 11/29/19 (from the past 24 hour(s))  Urinalysis, Routine w reflex microscopic     Status: Abnormal   Collection Time: 11/29/19 11:19 PM  Result Value Ref Range   Color, Urine YELLOW YELLOW   APPearance HAZY (A) CLEAR   Specific Gravity, Urine 1.009 1.005 - 1.030   pH 8.0 5.0 - 8.0   Glucose, UA NEGATIVE NEGATIVE mg/dL   Hgb urine dipstick NEGATIVE NEGATIVE   Bilirubin Urine NEGATIVE NEGATIVE   Ketones, ur NEGATIVE NEGATIVE mg/dL   Protein, ur NEGATIVE NEGATIVE mg/dL   Nitrite NEGATIVE NEGATIVE   Leukocytes,Ua NEGATIVE NEGATIVE    Imaging:  No results found.  MAU Course/MDM: I have ordered labs and reviewed results. Urine is clear.  No evidence of UTI or stones Discussed cervix exam is reassuring Due to nature of pain, this may be round ligament pain.  Urine checked to rule oiut urinary source. Reviewed signs of Preterm labor.      Assessment: Single intrauterine pregnancy at [redacted]w[redacted]d Pelvic cramping Possible round ligament pain  Plan: Discharge home Preterm Labor precautions and fetal kick counts Follow up in Office for prenatal visits   Encouraged to return here or to other Urgent Care/ED if she develops worsening of symptoms, increase in pain, fever, or other concerning symptoms.   Pt stable at time of discharge.  Hansel Feinstein CNM, MSN Certified Nurse-Midwife 11/29/2019 11:23 PM

## 2019-11-29 NOTE — Discharge Instructions (Signed)
Round Ligament Pain  The round ligament is a cord of muscle and tissue that helps support the uterus. It can become a source of pain during pregnancy if it becomes stretched or twisted as the baby grows. The pain usually begins in the second trimester (13-28 weeks) of pregnancy, and it can come and go until the baby is delivered. It is not a serious problem, and it does not cause harm to the baby. Round ligament pain is usually a short, sharp, and pinching pain, but it can also be a dull, lingering, and aching pain. The pain is felt in the lower side of the abdomen or in the groin. It usually starts deep in the groin and moves up to the outside of the hip area. The pain may occur when you:  Suddenly change position, such as quickly going from a sitting to standing position.  Roll over in bed.  Cough or sneeze.  Do physical activity. Follow these instructions at home:   Watch your condition for any changes.  When the pain starts, relax. Then try any of these methods to help with the pain: ? Sitting down. ? Flexing your knees up to your abdomen. ? Lying on your side with one pillow under your abdomen and another pillow between your legs. ? Sitting in a warm bath for 15-20 minutes or until the pain goes away.  Take over-the-counter and prescription medicines only as told by your health care provider.  Move slowly when you sit down or stand up.  Avoid long walks if they cause pain.  Stop or reduce your physical activities if they cause pain.  Keep all follow-up visits as told by your health care provider. This is important. Contact a health care provider if:  Your pain does not go away with treatment.  You feel pain in your back that you did not have before.  Your medicine is not helping. Get help right away if:  You have a fever or chills.  You develop uterine contractions.  You have vaginal bleeding.  You have nausea or vomiting.  You have diarrhea.  You have pain  when you urinate. Summary  Round ligament pain is felt in the lower abdomen or groin. It is usually a short, sharp, and pinching pain. It can also be a dull, lingering, and aching pain.  This pain usually begins in the second trimester (13-28 weeks). It occurs because the uterus is stretching with the growing baby, and it is not harmful to the baby.  You may notice the pain when you suddenly change position, when you cough or sneeze, or during physical activity.  Relaxing, flexing your knees to your abdomen, lying on one side, or taking a warm bath may help to get rid of the pain.  Get help from your health care provider if the pain does not go away or if you have vaginal bleeding, nausea, vomiting, diarrhea, or painful urination. This information is not intended to replace advice given to you by your health care provider. Make sure you discuss any questions you have with your health care provider. Document Revised: 04/07/2018 Document Reviewed: 04/07/2018 Elsevier Patient Education  2020 [employer].  Preterm Labor and Birth Information Pregnancy normally lasts 39-41 weeks. Preterm labor is when labor starts early. It starts before you have been pregnant for 37 whole weeks. What are the risk factors for preterm labor? Preterm labor is more likely to occur in women who:  Have an infection while pregnant.  Have a  cervix that is short.  Have gone into preterm labor before.  Have had surgery on their cervix.  Are younger than age 17.  Are older than age 35.  Are African American.  Are pregnant with two or more babies.  Take street drugs while pregnant.  Smoke while pregnant.  Do not gain enough weight while pregnant.  Got pregnant right after another pregnancy. What are the symptoms of preterm labor? Symptoms of preterm labor include:  Cramps. The cramps may feel like the cramps some women get during their period. The cramps may happen with watery poop  (diarrhea).  Pain in the belly (abdomen).  Pain in the lower back.  Regular contractions or tightening. It may feel like your belly is getting tighter.  Pressure in the lower belly that seems to get stronger.  More fluid (discharge) leaking from the vagina. The fluid may be watery or bloody.  Water breaking. Why is it important to notice signs of preterm labor? Babies who are born early may not be fully developed. They have a higher chance for:  Long-term heart problems.  Long-term lung problems.  Trouble controlling body systems, like breathing.  Bleeding in the brain.  A condition called cerebral palsy.  Learning difficulties.  Death. These risks are highest for babies who are born before 34 weeks of pregnancy. How is preterm labor treated? Treatment depends on:  How long you were pregnant.  Your condition.  The health of your baby. Treatment may involve:  Having a stitch (suture) placed in your cervix. When you give birth, your cervix opens so the baby can come out. The stitch keeps the cervix from opening too soon.  Staying at the hospital.  Taking or getting medicines, such as: ? Hormone medicines. ? Medicines to stop contractions. ? Medicines to help the baby's lungs develop. ? Medicines to prevent your baby from having cerebral palsy. What should I do if I am in preterm labor? If you think you are going into labor too soon, call your doctor right away. How can I prevent preterm labor?  Do not use any tobacco products. ? Examples of these are cigarettes, chewing tobacco, and e-cigarettes. ? If you need help quitting, ask your doctor.  Do not use street drugs.  Do not use any medicines unless you ask your doctor if they are safe for you.  Talk with your doctor before taking any herbal supplements.  Make sure you gain enough weight.  Watch for infection. If you think you might have an infection, get it checked right away.  If you have gone into  preterm labor before, tell your doctor. This information is not intended to replace advice given to you by your health care provider. Make sure you discuss any questions you have with your health care provider. Document Revised: 02/11/2019 Document Reviewed: 03/12/2016 Elsevier Patient Education  2020 Elsevier Inc.  

## 2020-01-10 LAB — OB RESULTS CONSOLE HIV ANTIBODY (ROUTINE TESTING): HIV: NONREACTIVE

## 2020-02-01 ENCOUNTER — Other Ambulatory Visit: Payer: Self-pay

## 2020-02-01 ENCOUNTER — Inpatient Hospital Stay (HOSPITAL_COMMUNITY)
Admission: AD | Admit: 2020-02-01 | Discharge: 2020-02-02 | Disposition: A | Discharge status: Home / Self Care | Payer: Medicaid Other | Attending: Obstetrics and Gynecology | Admitting: Obstetrics and Gynecology

## 2020-02-01 ENCOUNTER — Encounter (HOSPITAL_COMMUNITY): Payer: Self-pay | Admitting: Obstetrics and Gynecology

## 2020-02-01 DIAGNOSIS — Z3689 Encounter for other specified antenatal screening: Secondary | ICD-10-CM

## 2020-02-01 DIAGNOSIS — Z3A30 30 weeks gestation of pregnancy: Secondary | ICD-10-CM | POA: Diagnosis not present

## 2020-02-01 DIAGNOSIS — Z0371 Encounter for suspected problem with amniotic cavity and membrane ruled out: Secondary | ICD-10-CM

## 2020-02-01 DIAGNOSIS — O26893 Other specified pregnancy related conditions, third trimester: Secondary | ICD-10-CM | POA: Diagnosis not present

## 2020-02-01 LAB — URINALYSIS, ROUTINE W REFLEX MICROSCOPIC
Bilirubin Urine: NEGATIVE
Glucose, UA: NEGATIVE mg/dL
Hgb urine dipstick: NEGATIVE
Ketones, ur: NEGATIVE mg/dL
Nitrite: NEGATIVE
Protein, ur: NEGATIVE mg/dL
Specific Gravity, Urine: 1.005 (ref 1.005–1.030)
pH: 8 (ref 5.0–8.0)

## 2020-02-01 LAB — POCT FERN TEST: POCT Fern Test: NEGATIVE

## 2020-02-01 NOTE — MAU Note (Signed)
Leaking fld for an hour. Pt is unsure of color of fld. Some pelvic pressure.

## 2020-02-01 NOTE — MAU Provider Note (Addendum)
History     CSN: [id number]  Arrival date and time: 02/01/20 2216   First Provider Initiated Contact with Patient 02/01/20 2335      Chief Complaint  Patient presents with  . Rupture of Membranes   Cheryl Acevedo is a 29 y.o. G3P1011 at [redacted]w[redacted]d who receives care at [facility].  She presents today for Rupture of Membranes.  Patient states she had 4 gushes of fluid around 10pm.  She states she was sitting down had 3 during that time and then one upon standing.  Patient states "they were just small though."  Patient states she was not laughing at the time, but did have her daughter on her lap.  She states that she did not notice any smell or color to the fluid, but also did not go to the bathroom but called her provider and came to the hospital.  However, patient states that upon arrival she had another gush and smelled her pants.  She states it did smell like urine.  Patient states she went to the bathroom upon arrival and has had no additional gushes of fluid. Patient endorses fetal movement and denies vaginal bleeding, but reports some hemorrhoids with blood.  She states she did notice some mucous type discharge prior to the leaking.  She denies sexual activity in the past 3 days.  Patient denies abdominal cramping and/or contractions. Patient endorses a history of GC with current partner, but states "it was like 10 years ago when he was sneaky."      OB History    Gravida  3   Para  1   Term  1   Preterm      AB  1   Living  1     SAB      TAB  1   Ectopic      Multiple  0   Live Births  1           Past Medical History:  Diagnosis Date  . Anxiety   . Bipolar disorder (HCC)   . Depression   . Obsessive compulsive disorder   . Paranoia (HCC)   . Scoliosis     Past Surgical History:  Procedure Laterality Date  . COSMETIC SURGERY    . WISDOM TOOTH EXTRACTION Bilateral 2000   x4    Family History  Problem Relation Age of Onset  . Cancer Mother   .  Diabetes Other   . Cancer Other     Social History   Tobacco Use  . Smoking status: Never Smoker  . Smokeless tobacco: Never Used  Substance Use Topics  . Alcohol use: No    Comment: Occasional  . Drug use: No    Allergies: No Known Allergies  Medications Prior to Admission  Medication Sig Dispense Refill Last Dose  . acetaminophen (TYLENOL) 325 MG tablet Take 650 mg by mouth every 6 (six) hours as needed.   Past Week at Unknown time  . ARIPiprazole ER (ABILIFY MAINTENA) 400 MG PRSY prefilled syringe Inject 400 mg into the muscle every 28 (twenty-eight) days.    Past Month at Unknown time  . sertraline (ZOLOFT) 100 MG tablet Take 50 mg by mouth in the morning and at bedtime.    02/01/2020 at Unknown time  . lisdexamfetamine (VYVANSE) 30 MG capsule Take 30 mg by mouth daily.        Review of Systems  Constitutional: Negative for chills and fever.  Respiratory: Negative for cough and  shortness of breath.   Gastrointestinal: Negative for abdominal pain.  Genitourinary: Positive for vaginal discharge. Negative for difficulty urinating, dysuria and vaginal bleeding.   Physical Exam   Blood pressure 115/72, pulse (!) 103, temperature 98.8 F (37.1 C), resp. rate 18, height 5\' 11"  (1.803 m), weight 91.2 kg, last menstrual period 06/15/2019, unknown if currently breastfeeding.  Physical Exam  Constitutional: She is oriented to person, place, and time. She appears well-developed and well-nourished. No distress.  HENT:  Head: Normocephalic and atraumatic.  Eyes: Conjunctivae are normal.  Cardiovascular: Normal rate, regular rhythm and normal heart sounds.  Respiratory: Effort normal and breath sounds normal.  GI: Soft. There is no abdominal tenderness.  Gravid--fundal height appears AGA, Soft, NT   Genitourinary: Cervix exhibits no motion tenderness, no discharge and no friability.    No vaginal discharge or bleeding.  No bleeding in the vagina.    Genitourinary Comments: Sterile  Speculum Exam: -Normal External Genitalia: Non tender, no apparent discharge at introitus.  -Vaginal Vault: Pink mucosa with good rugae. No pooling.  Scant amt thin white discharge -wet prep and fern collected -Cervix:Pink, no lesions, cysts, or polyps.  Appears closed. No active bleeding from os, but moderate amt cloudy yellowish mucoid discharge noted-GC/CT collected -Bimanual Exam:  Closed/Thick    Musculoskeletal:        General: No edema.  Neurological: She is alert and oriented to person, place, and time.  Skin: Skin is warm and dry.  Psychiatric: Her speech is normal and behavior is normal. Her mood appears anxious.    Fetal Assessment 135 bpm, Mod Var, -Decels, +Accels Toco: No ctx graphed  MAU Course   Results for orders placed or performed during the hospital encounter of 02/01/20 (from the past 24 hour(s))  Urinalysis, Routine w reflex microscopic     Status: Abnormal   Collection Time: 02/01/20 10:48 PM  Result Value Ref Range   Color, Urine YELLOW YELLOW   APPearance HAZY (A) CLEAR   Specific Gravity, Urine 1.005 1.005 - 1.030   pH 8.0 5.0 - 8.0   Glucose, UA NEGATIVE NEGATIVE mg/dL   Hgb urine dipstick NEGATIVE NEGATIVE   Bilirubin Urine NEGATIVE NEGATIVE   Ketones, ur NEGATIVE NEGATIVE mg/dL   Protein, ur NEGATIVE NEGATIVE mg/dL   Nitrite NEGATIVE NEGATIVE   Leukocytes,Ua TRACE (A) NEGATIVE   RBC / HPF 0-5 0 - 5 RBC/hpf   WBC, UA 0-5 0 - 5 WBC/hpf   Bacteria, UA RARE (A) NONE SEEN   Squamous Epithelial / LPF 0-5 0 - 5  Fern Test     Status: Normal   Collection Time: 02/01/20 11:25 PM  Result Value Ref Range   POCT Fern Test Negative = intact amniotic membranes   Wet prep, genital     Status: Abnormal   Collection Time: 02/01/20 11:40 PM   Specimen: PATH Cytology Cervicovaginal Ancillary Only  Result Value Ref Range   Yeast Wet Prep HPF POC NONE SEEN NONE SEEN   Trich, Wet Prep NONE SEEN NONE SEEN   Clue Cells Wet Prep HPF POC NONE SEEN NONE SEEN   WBC,  Wet Prep HPF POC MANY (A) NONE SEEN   Sperm NONE SEEN    No results found.  MDM PE Labs:  Wet Prep, GC/CT EFM Fern Assessment and Plan  29 year old G3P1011  SIUP at 30.5weeks Cat I FT Leakage of Fluid  -POC discussed. -Exam performed and findings discussed. -Patient informed that no fluid noted in vault. -Reassured that  samples would be investigated by provider, but likely urine especially if it discontinued after bathroom usage.  -Cultures collected and pending.  -Patient without question. -Will await labs.   Maryann Conners MSN, CNM 02/01/2020, 11:35 PM   Reassessment (12:04 AM) Ruffin Frederick  -Patient requests discharge. -PTL Precautions reviewed. -Patient instructed to monitor and return if symptoms persists. -Informed that wet prep will be released to mychart for review and if treatment necessary it would be sent to pharmacy on file. -Patient questions if provider thinks it could be GC.  Reassured that exam was not indicative of GC findings, but that tests would be determinant.  -Patient without further questions or concerns. -Instructed to keep appts as scheduled. -Encouraged to call primary office or return to MAU if symptoms worsen or with the onset of new symptoms. -Discharged to home in stable condition.  Maryann Conners MSN, CNM Advanced Practice Provider, [facility]

## 2020-02-02 LAB — WET PREP, GENITAL
Clue Cells Wet Prep HPF POC: NONE SEEN
Sperm: NONE SEEN
Trich, Wet Prep: NONE SEEN
Yeast Wet Prep HPF POC: NONE SEEN

## 2020-02-02 NOTE — Progress Notes (Signed)
Jessica Emly CNM in earlier to discuss d/c plan. Written and verbal d/c instructions given and understanding voiced 

## 2020-02-02 NOTE — Discharge Instructions (Signed)

## 2020-02-03 LAB — GC/CHLAMYDIA PROBE AMP (~~LOC~~) NOT AT ARMC
Chlamydia: NEGATIVE
Comment: NEGATIVE
Comment: NORMAL
Neisseria Gonorrhea: NEGATIVE

## 2020-02-12 ENCOUNTER — Inpatient Hospital Stay (HOSPITAL_COMMUNITY)
Admission: AD | Admit: 2020-02-12 | Discharge: 2020-02-12 | Discharge status: Against Medical Advice | Payer: Medicaid Other | Attending: Obstetrics and Gynecology | Admitting: Obstetrics and Gynecology

## 2020-02-12 ENCOUNTER — Other Ambulatory Visit: Payer: Self-pay

## 2020-02-12 ENCOUNTER — Encounter (HOSPITAL_COMMUNITY): Payer: Self-pay | Admitting: Obstetrics and Gynecology

## 2020-02-12 DIAGNOSIS — Z3A36 36 weeks gestation of pregnancy: Secondary | ICD-10-CM

## 2020-02-12 DIAGNOSIS — Z3A32 32 weeks gestation of pregnancy: Secondary | ICD-10-CM | POA: Insufficient documentation

## 2020-02-12 DIAGNOSIS — O26893 Other specified pregnancy related conditions, third trimester: Secondary | ICD-10-CM | POA: Diagnosis not present

## 2020-02-12 DIAGNOSIS — Z5329 Procedure and treatment not carried out because of patient's decision for other reasons: Secondary | ICD-10-CM | POA: Insufficient documentation

## 2020-02-12 DIAGNOSIS — R109 Unspecified abdominal pain: Secondary | ICD-10-CM | POA: Insufficient documentation

## 2020-02-12 LAB — URINALYSIS, ROUTINE W REFLEX MICROSCOPIC
Bilirubin Urine: NEGATIVE
Glucose, UA: NEGATIVE mg/dL
Hgb urine dipstick: NEGATIVE
Ketones, ur: NEGATIVE mg/dL
Leukocytes,Ua: NEGATIVE
Nitrite: NEGATIVE
Protein, ur: NEGATIVE mg/dL
Specific Gravity, Urine: 1.003 — ABNORMAL LOW (ref 1.005–1.030)
pH: 7 (ref 5.0–8.0)

## 2020-02-12 LAB — WET PREP, GENITAL
Clue Cells Wet Prep HPF POC: NONE SEEN
Sperm: NONE SEEN
Trich, Wet Prep: NONE SEEN
Yeast Wet Prep HPF POC: NONE SEEN

## 2020-02-12 NOTE — MAU Note (Signed)
Pt reports she received a phone call and needed to go get her daughter.  Pt declined another cervical check before leaving.  Pt left AMA, form signed.

## 2020-02-12 NOTE — MAU Provider Note (Signed)
Patient Cheryl Acevedo is a 29 y.o. G3P1011  at [redacted]w[redacted]d here with complaints of cramping. She denies bleeding, LOF, decreased fetal movements, dysuria, chest pain, SOB, fever, HA. She vomits daily; takes promethazine daily. Other than "being sick the whole time", she has had no complications in this pregnancy.   She denies recent intercourse.  History     CSN: [id number]  Arrival date and time: 02/12/20 1607   None     No chief complaint on file.  Abdominal Pain This is a new problem. The current episode started yesterday. The problem occurs intermittently. The problem has been gradually improving. The pain is located in the periumbilical region and suprapubic region. The pain is at a severity of 2/10. The quality of the pain is cramping and sharp. The abdominal pain does not radiate. Associated symptoms include vomiting. Pertinent negatives include no constipation, diarrhea, dysuria or fever.  She reports that she feels "little itty bitty contractions". She doesn't think its labor, she thinks it be related to the position of the baby. She says she has been moving boxes and wonders if that is why she is contracting.   OB History    Gravida  3   Para  1   Term  1   Preterm      AB  1   Living  1     SAB      TAB  1   Ectopic      Multiple  0   Live Births  1           Past Medical History:  Diagnosis Date  . Anxiety   . Bipolar disorder ([place])   . Depression   . Obsessive compulsive disorder   . Paranoia ([place])   . Scoliosis     Past Surgical History:  Procedure Laterality Date  . COSMETIC SURGERY    . WISDOM TOOTH EXTRACTION Bilateral 2000   x4    Family History  Problem Relation Age of Onset  . Cancer Mother   . Diabetes Other   . Cancer Other     Social History   Tobacco Use  . Smoking status: Never Smoker  . Smokeless tobacco: Never Used  Substance Use Topics  . Alcohol use: No    Comment: Occasional  . Drug use: No    Allergies: No  Known Allergies  Medications Prior to Admission  Medication Sig Dispense Refill Last Dose  . acetaminophen (TYLENOL) 325 MG tablet Take 650 mg by mouth every 6 (six) hours as needed.     . ARIPiprazole ER (ABILIFY MAINTENA) 400 MG PRSY prefilled syringe Inject 400 mg into the muscle every 28 (twenty-eight) days.      Marland Kitchen sertraline (ZOLOFT) 100 MG tablet Take 50 mg by mouth in the morning and at bedtime.        Review of Systems  Constitutional: Negative for fever.  Respiratory: Negative.   Cardiovascular: Negative.   Gastrointestinal: Positive for abdominal pain and vomiting. Negative for constipation and diarrhea.  Genitourinary: Negative for dysuria.   Physical Exam   Blood pressure 118/74, pulse 96, temperature 98.3 F (36.8 C), temperature source Oral, resp. rate 19, height 5\' 11"  (1.803 m), weight 93.8 kg, last menstrual period 06/15/2019, SpO2 99 %, unknown if currently breastfeeding.  Physical Exam  Constitutional: She is oriented to person, place, and time. She appears well-developed.  HENT:  Head: Normocephalic.  Respiratory: Effort normal.  GI: Soft.  Genitourinary:    Vagina normal.  Genitourinary Comments: NEFG; cervix is long, closed, posterior.    Musculoskeletal:        General: Normal range of motion.     Cervical back: Normal range of motion.  Neurological: She is alert and oriented to person, place, and time.  Skin: Skin is warm.    MAU Course  Procedures  MDM -NST: 130 bpm, mod var, present acel, no decels, no contractions.  -fetal fibronectin collected but not sent. -wet prep: negative -G probe pending -UA negative for signs of infection Assessment and Plan  -Patient left AMA after receiving a call about childcare for her daughter.   Charlesetta Garibaldi Levante Simones 02/12/2020, 5:08 PM

## 2020-02-12 NOTE — MAU Note (Signed)
Cheryl Acevedo is a 29 y.o. at [redacted]w[redacted]d here in MAU reporting: cramping since yesterday. Today is having trouble walking, is having a lot of pressure. No bleeding or LOF. +FM  Onset of complaint: yesterday  Pain score: 3/10  Vitals:   02/12/20 1621  BP: 118/74  Pulse: 96  Resp: 19  Temp: 98.3 F (36.8 C)  SpO2: 99%     FHT: +FM  Lab orders placed from triage: UA

## 2020-02-13 LAB — GC/CHLAMYDIA PROBE AMP (~~LOC~~) NOT AT ARMC
Chlamydia: NEGATIVE
Comment: NEGATIVE
Comment: NORMAL
Neisseria Gonorrhea: NEGATIVE

## 2020-03-09 ENCOUNTER — Inpatient Hospital Stay (HOSPITAL_BASED_OUTPATIENT_CLINIC_OR_DEPARTMENT_OTHER): Payer: Medicaid Other

## 2020-03-09 ENCOUNTER — Inpatient Hospital Stay (HOSPITAL_COMMUNITY)
Admission: AD | Admit: 2020-03-09 | Discharge: 2020-03-09 | Disposition: A | Discharge status: Home / Self Care | Payer: Medicaid Other | Attending: Obstetrics and Gynecology | Admitting: Obstetrics and Gynecology

## 2020-03-09 ENCOUNTER — Encounter (HOSPITAL_COMMUNITY): Payer: Self-pay | Admitting: Obstetrics and Gynecology

## 2020-03-09 DIAGNOSIS — F429 Obsessive-compulsive disorder, unspecified: Secondary | ICD-10-CM | POA: Insufficient documentation

## 2020-03-09 DIAGNOSIS — Z3A36 36 weeks gestation of pregnancy: Secondary | ICD-10-CM | POA: Diagnosis not present

## 2020-03-09 DIAGNOSIS — O368390 Maternal care for abnormalities of the fetal heart rate or rhythm, unspecified trimester, not applicable or unspecified: Secondary | ICD-10-CM | POA: Diagnosis not present

## 2020-03-09 DIAGNOSIS — F419 Anxiety disorder, unspecified: Secondary | ICD-10-CM | POA: Diagnosis not present

## 2020-03-09 DIAGNOSIS — O368930 Maternal care for other specified fetal problems, third trimester, not applicable or unspecified: Secondary | ICD-10-CM | POA: Diagnosis not present

## 2020-03-09 DIAGNOSIS — O368130 Decreased fetal movements, third trimester, not applicable or unspecified: Secondary | ICD-10-CM | POA: Diagnosis present

## 2020-03-09 DIAGNOSIS — O368330 Maternal care for abnormalities of the fetal heart rate or rhythm, third trimester, not applicable or unspecified: Secondary | ICD-10-CM | POA: Insufficient documentation

## 2020-03-09 DIAGNOSIS — Z79899 Other long term (current) drug therapy: Secondary | ICD-10-CM | POA: Insufficient documentation

## 2020-03-09 DIAGNOSIS — O99343 Other mental disorders complicating pregnancy, third trimester: Secondary | ICD-10-CM | POA: Diagnosis not present

## 2020-03-09 DIAGNOSIS — F319 Bipolar disorder, unspecified: Secondary | ICD-10-CM | POA: Diagnosis not present

## 2020-03-09 NOTE — Progress Notes (Signed)
Wynelle Bourgeois CNM in to discuss u/s results and d/c plan. Written and verbal d/c instructions given and understanding voiced.

## 2020-03-09 NOTE — MAU Note (Signed)
Feeling baby move but not as much as usual since yesterday. Denies any pain, VB, or LOF

## 2020-03-09 NOTE — Progress Notes (Signed)
BAby very active. Pt states baby moving like normal now.

## 2020-03-09 NOTE — Discharge Instructions (Signed)
Labor and Vaginal Delivery  Vaginal delivery means that you give birth by pushing your baby out of your birth canal (vagina). A team of health care providers will help you before, during, and after vaginal delivery. Birth experiences are unique for every woman and every pregnancy, and birth experiences vary depending on where you choose to give birth. What happens when I arrive at the birth center or hospital? Once you are in labor and have been admitted into the hospital or birth center, your health care provider may:  Review your pregnancy history and any concerns that you have.  Insert an IV into one of your veins. This may be used to give you fluids and medicines.  Check your blood pressure, pulse, temperature, and heart rate (vital signs).  Check whether your bag of water (amniotic sac) has broken (ruptured).  Talk with you about your birth plan and discuss pain control options. Monitoring Your health care provider may monitor your contractions (uterine monitoring) and your baby's heart rate (fetal monitoring). You may need to be monitored:  Often, but not continuously (intermittently).  All the time or for long periods at a time (continuously). Continuous monitoring may be needed if: ? You are taking certain medicines, such as medicine to relieve pain or make your contractions stronger. ? You have pregnancy or labor complications. Monitoring may be done by:  Placing a special stethoscope or a handheld monitoring device on your abdomen to check your baby's heartbeat and to check for contractions.  Placing monitors on your abdomen (external monitors) to record your baby's heartbeat and the frequency and length of contractions.  Placing monitors inside your uterus through your vagina (internal monitors) to record your baby's heartbeat and the frequency, length, and strength of your contractions. Depending on the type of monitor, it may remain in your uterus or on your baby's head  until birth.  Telemetry. This is a type of continuous monitoring that can be done with external or internal monitors. Instead of having to stay in bed, you are able to move around during telemetry. Physical exam Your health care provider may perform frequent physical exams. This may include:  Checking how and where your baby is positioned in your uterus.  Checking your cervix to determine: ? Whether it is thinning out (effacing). ? Whether it is opening up (dilating). What happens during labor and delivery?  Normal labor and delivery is divided into the following three stages: Stage 1  This is the longest stage of labor.  This stage can last for hours or days.  Throughout this stage, you will feel contractions. Contractions generally feel mild, infrequent, and irregular at first. They get stronger, more frequent (about every 2-3 minutes), and more regular as you move through this stage.  This stage ends when your cervix is completely dilated to 4 inches (10 cm) and completely effaced. Stage 2  This stage starts once your cervix is completely effaced and dilated and lasts until the delivery of your baby.  This stage may last from 20 minutes to 2 hours.  This is the stage where you will feel an urge to push your baby out of your vagina.  You may feel stretching and burning pain, especially when the widest part of your baby's head passes through the vaginal opening (crowning).  Once your baby is delivered, the umbilical cord will be clamped and cut. This usually occurs after waiting a period of 1-2 minutes after delivery.  Your baby will be placed on your   bare chest (skin-to-skin contact) in an upright position and covered with a warm blanket. Watch your baby for feeding cues, like rooting or sucking, and help the baby to your breast for his or her first feeding. Stage 3  This stage starts immediately after the birth of your baby and ends after you deliver the placenta.  This  stage may take anywhere from 5 to 30 minutes.  After your baby has been delivered, you will feel contractions as your body expels the placenta and your uterus contracts to control bleeding. What can I expect after labor and delivery?  After labor is over, you and your baby will be monitored closely until you are ready to go home to ensure that you are both healthy. Your health care team will teach you how to care for yourself and your baby.  You and your baby will stay in the same room (rooming in) during your hospital stay. This will encourage early bonding and successful breastfeeding.  You may continue to receive fluids and medicines through an IV.  Your uterus will be checked and massaged regularly (fundal massage).  You will have some soreness and pain in your abdomen, vagina, and the area of skin between your vaginal opening and your anus (perineum).  If an incision was made near your vagina (episiotomy) or if you had some vaginal tearing during delivery, cold compresses may be placed on your episiotomy or your tear. This helps to reduce pain and swelling.  You may be given a squirt bottle to use instead of wiping when you go to the bathroom. To use the squirt bottle, follow these steps: ? Before you urinate, fill the squirt bottle with warm water. Do not use hot water. ? After you urinate, while you are sitting on the toilet, use the squirt bottle to rinse the area around your urethra and vaginal opening. This rinses away any urine and blood. ? Fill the squirt bottle with clean water every time you use the bathroom.  It is normal to have vaginal bleeding after delivery. Wear a sanitary pad for vaginal bleeding and discharge. Summary  Vaginal delivery means that you will give birth by pushing your baby out of your birth canal (vagina).  Your health care provider may monitor your contractions (uterine monitoring) and your baby's heart rate (fetal monitoring).  Your health care  provider may perform a physical exam.  Normal labor and delivery is divided into three stages.  After labor is over, you and your baby will be monitored closely until you are ready to go home. This information is not intended to replace advice given to you by your health care provider. Make sure you discuss any questions you have with your health care provider. Document Revised: 11/24/2017 Document Reviewed: 11/24/2017 Elsevier Patient Education  2020 Elsevier Inc.  

## 2020-03-09 NOTE — MAU Note (Signed)
Once EFM applied pt states again she feels FM but just not as stong as usual.

## 2020-03-09 NOTE — MAU Provider Note (Signed)
Chief Complaint:  Decreased Fetal Movement   First Provider Initiated Contact with Patient 03/09/20 0418     HPI: Cheryl Acevedo is a 29 y.o. G3P1011 at 15w0dwho presents to maternity admissions reporting decreased fetal movement.  Fetus does move but less than usual.  . She denies LOF, vaginal bleeding, vaginal itching/burning, urinary symptoms, h/a, dizziness, n/v, diarrhea, constipation or fever/chills.  .  Other This is a new (Decreased fetal movement) problem. The current episode started today. The problem occurs constantly. The problem has been unchanged. Pertinent negatives include no abdominal pain, chest pain, chills, fever, myalgias or nausea. Nothing aggravates the symptoms. She has tried nothing for the symptoms.    RN Note: Feeling baby move but not as much as usual since yesterday. Denies any pain, VB, or LOF   Past Medical History: Past Medical History:  Diagnosis Date  . Anxiety   . Bipolar disorder (HCC)   . Depression   . Obsessive compulsive disorder   . Paranoia (HCC)   . Scoliosis     Past obstetric history: OB History  Gravida Para Term Preterm AB Living  3 1 1   1 1   SAB TAB Ectopic Multiple Live Births    1   0 1    # Outcome Date GA Lbr Len/2nd Weight Sex Delivery Anes PTL Lv  3 Current           2 Term 04/22/17 [redacted]w[redacted]d 06:58 / 01:15 3225 g F Vag-Spont EPI  LIV  1 TAB             Past Surgical History: Past Surgical History:  Procedure Laterality Date  . COSMETIC SURGERY    . WISDOM TOOTH EXTRACTION Bilateral 2000   x4    Family History: Family History  Problem Relation Age of Onset  . Cancer Mother   . Diabetes Other   . Cancer Other     Social History: Social History   Tobacco Use  . Smoking status: Never Smoker  . Smokeless tobacco: Never Used  Substance Use Topics  . Alcohol use: No    Comment: Occasional  . Drug use: No    Allergies: No Known Allergies  Meds:  Medications Prior to Admission  Medication Sig Dispense  Refill Last Dose  . sertraline (ZOLOFT) 100 MG tablet Take 50 mg by mouth in the morning and at bedtime.    03/08/2020 at Unknown time  . acetaminophen (TYLENOL) 325 MG tablet Take 650 mg by mouth every 6 (six) hours as needed.   Unknown at Unknown time  . ARIPiprazole ER (ABILIFY MAINTENA) 400 MG PRSY prefilled syringe Inject 400 mg into the muscle every 28 (twenty-eight) days.        I have reviewed patient's Past Medical Hx, Surgical Hx, Family Hx, Social Hx, medications and allergies.   ROS:  Review of Systems  Constitutional: Negative for chills and fever.  Cardiovascular: Negative for chest pain.  Gastrointestinal: Negative for abdominal pain and nausea.  Musculoskeletal: Negative for myalgias.   Other systems negative  Physical Exam   Patient Vitals for the past 24 hrs:  BP Temp Pulse Resp Height Weight  03/09/20 0332 114/68 -- 74 -- -- --  03/09/20 0325 119/78 98.5 F (36.9 C) 76 18 -- --  03/09/20 0324 -- -- -- -- 5\' 11"  (1.803 m) 91.6 kg   Constitutional: Well-developed, well-nourished female in no acute distress.  Cardiovascular: normal rate and rhythm Respiratory: normal effort, clear to auscultation bilaterally GI: Abd soft,  non-tender, gravid appropriate for gestational age.   No rebound or guarding. MS: Extremities nontender, no edema, normal ROM Neurologic: Alert and oriented x 4.  GU: Neg CVAT.  PELVIC EXAM: deferred    FHT:  Baseline 120 , moderate variability, accelerations present, several small decelerations Contractions: occasional    Labs: No results found for this or any previous visit (from the past 24 hour(s)).     Imaging:  BPP and AFI done due to several small decels Baby got 8/8 score  AFI is within normal limits  MAU Course/MDM: NST reviewed and is grossly reassuring.  Fetus very active.  There were several sharp, small variables Consult Dr Vevelyn Francois with presentation, exam findings and test results. She recommends BPP/AFI for  reassurance Treatments in MAU included EFM, [place].    Assessment: Single IUP at [redacted]w[redacted]d Decreased fetal movement Several small variable decels Reassuring BPP and AFI  Plan: Discharge home Labor precautions and fetal kick counts Follow up in Office for prenatal visits  Encouraged to return here or to other Urgent Care/ED if she develops worsening of symptoms, increase in pain, fever, or other concerning symptoms.   Pt stable at time of discharge.  Hansel Feinstein CNM, MSN Certified Nurse-Midwife 03/09/2020 4:18 AM

## 2020-03-12 ENCOUNTER — Inpatient Hospital Stay (HOSPITAL_COMMUNITY)
Admission: AD | Admit: 2020-03-12 | Discharge: 2020-03-12 | Disposition: A | Discharge status: Home / Self Care | Payer: Medicaid Other | Source: Ambulatory Visit | Attending: Obstetrics and Gynecology | Admitting: Obstetrics and Gynecology

## 2020-03-12 ENCOUNTER — Other Ambulatory Visit: Payer: Self-pay

## 2020-03-12 ENCOUNTER — Encounter (HOSPITAL_COMMUNITY): Payer: Self-pay | Admitting: Obstetrics and Gynecology

## 2020-03-12 DIAGNOSIS — Z298 Encounter for other specified prophylactic measures: Secondary | ICD-10-CM | POA: Diagnosis not present

## 2020-03-12 DIAGNOSIS — O479 False labor, unspecified: Secondary | ICD-10-CM

## 2020-03-12 DIAGNOSIS — Z3A37 37 weeks gestation of pregnancy: Secondary | ICD-10-CM | POA: Insufficient documentation

## 2020-03-12 DIAGNOSIS — O471 False labor at or after 37 completed weeks of gestation: Secondary | ICD-10-CM | POA: Diagnosis present

## 2020-03-12 DIAGNOSIS — Z3A36 36 weeks gestation of pregnancy: Secondary | ICD-10-CM | POA: Diagnosis not present

## 2020-03-12 DIAGNOSIS — O26893 Other specified pregnancy related conditions, third trimester: Secondary | ICD-10-CM | POA: Diagnosis not present

## 2020-03-12 MED ORDER — BETAMETHASONE SOD PHOS & ACET 6 (3-3) MG/ML IJ SUSP
12.0000 mg | Freq: Once | INTRAMUSCULAR | Status: AC
  Administered 2020-03-12: 14:00:00 12 mg via INTRAMUSCULAR
  Filled 2020-03-12: qty 5

## 2020-03-12 NOTE — Discharge Instructions (Signed)
Braxton Hicks Contractions °Contractions of the uterus can occur throughout pregnancy, but they are not always a sign that you are in labor. You may have practice contractions called Braxton Hicks contractions. These false labor contractions are sometimes confused with true labor. °What are Braxton Hicks contractions? °Braxton Hicks contractions are tightening movements that occur in the muscles of the uterus before labor. Unlike true labor contractions, these contractions do not result in opening (dilation) and thinning of the cervix. Toward the end of pregnancy (32-34 weeks), Braxton Hicks contractions can happen more often and may become stronger. These contractions are sometimes difficult to tell apart from true labor because they can be very uncomfortable. You should not feel embarrassed if you go to the hospital with false labor. °Sometimes, the only way to tell if you are in true labor is for your health care provider to look for changes in the cervix. The health care provider will do a physical exam and may monitor your contractions. If you are not in true labor, the exam should show that your cervix is not dilating and your water has not broken. °If there are no other health problems associated with your pregnancy, it is completely safe for you to be sent home with false labor. You may continue to have Braxton Hicks contractions until you go into true labor. °How to tell the difference between true labor and false labor °True labor °· Contractions last 30-70 seconds. °· Contractions become very regular. °· Discomfort is usually felt in the top of the uterus, and it spreads to the lower abdomen and low back. °· Contractions do not go away with walking. °· Contractions usually become more intense and increase in frequency. °· The cervix dilates and gets thinner. °False labor °· Contractions are usually shorter and not as strong as true labor contractions. °· Contractions are usually irregular. °· Contractions  are often felt in the front of the lower abdomen and in the groin. °· Contractions may go away when you walk around or change positions while lying down. °· Contractions get weaker and are shorter-lasting as time goes on. °· The cervix usually does not dilate or become thin. °Follow these instructions at home: ° °· Take over-the-counter and prescription medicines only as told by your health care provider. °· Keep up with your usual exercises and follow other instructions from your health care provider. °· Eat and drink lightly if you think you are going into labor. °· If Braxton Hicks contractions are making you uncomfortable: °? Change your position from lying down or resting to walking, or change from walking to resting. °? Sit and rest in a tub of warm water. °? Drink enough fluid to keep your urine pale yellow. Dehydration may cause these contractions. °? Do slow and deep breathing several times an hour. °· Keep all follow-up prenatal visits as told by your health care provider. This is important. °Contact a health care provider if: °· You have a fever. °· You have continuous pain in your abdomen. °Get help right away if: °· Your contractions become stronger, more regular, and closer together. °· You have fluid leaking or gushing from your vagina. °· You pass blood-tinged mucus (bloody show). °· You have bleeding from your vagina. °· You have low back pain that you never had before. °· You feel your baby’s head pushing down and causing pelvic pressure. °· Your baby is not moving inside you as much as it used to. °Summary °· Contractions that occur before labor are   called Braxton Hicks contractions, false labor, or practice contractions. °· Braxton Hicks contractions are usually shorter, weaker, farther apart, and less regular than true labor contractions. True labor contractions usually become progressively stronger and regular, and they become more frequent. °· Manage discomfort from Braxton Hicks contractions  by changing position, resting in a warm bath, drinking plenty of water, or practicing deep breathing. °This information is not intended to replace advice given to you by your health care provider. Make sure you discuss any questions you have with your health care provider. °Document Revised: 10/02/2017 Document Reviewed: 03/05/2017 °Elsevier Patient Education © 2020 Elsevier Inc. ° °

## 2020-03-12 NOTE — MAU Note (Signed)
Pt sent from office, per Dr Mindi Slicker, for betamethasone injection. Pt states she was contracting over night so went to office. Was checked and was 3 cm. Dr Mindi Slicker said she just needs shot. Pt says after Dr Mindi Slicker checked her she started contracting again. Denies LOF, VB. +FM

## 2020-03-13 ENCOUNTER — Inpatient Hospital Stay (HOSPITAL_COMMUNITY)
Admission: AD | Admit: 2020-03-13 | Discharge: 2020-03-13 | Disposition: A | Discharge status: Home / Self Care | Payer: Medicaid Other | Attending: Obstetrics and Gynecology | Admitting: Obstetrics and Gynecology

## 2020-03-13 DIAGNOSIS — O26893 Other specified pregnancy related conditions, third trimester: Secondary | ICD-10-CM | POA: Diagnosis not present

## 2020-03-13 DIAGNOSIS — Z298 Encounter for other specified prophylactic measures: Secondary | ICD-10-CM

## 2020-03-13 DIAGNOSIS — Z3A36 36 weeks gestation of pregnancy: Secondary | ICD-10-CM | POA: Insufficient documentation

## 2020-03-13 MED ORDER — BETAMETHASONE SOD PHOS & ACET 6 (3-3) MG/ML IJ SUSP
12.0000 mg | Freq: Once | INTRAMUSCULAR | Status: AC
  Administered 2020-03-13: 12 mg via INTRAMUSCULAR

## 2020-03-13 NOTE — MAU Provider Note (Signed)
First Provider Initiated Contact with Patient 03/13/20 1333      S Ms. Cheryl Acevedo is a 29 y.o. G3P1011 at [redacted]w[redacted]d who presents to MAU today for second BMZ injection. Patient reports she is feeling good. Denies worsening abdominal pain, contractions or LOF. +FM. Patient reports she passed from mucus discharge last night after being checked.   O LMP 06/15/2019  Physical Exam  Vitals reviewed. Constitutional: She is oriented to person, place, and time. She appears well-developed and well-nourished. No distress.  Cardiovascular: Normal rate and regular rhythm.  Respiratory: Effort normal and breath sounds normal. No respiratory distress. She has no wheezes.  Musculoskeletal:        General: No edema. Normal range of motion.  Neurological: She is alert and oriented to person, place, and time.  Psychiatric: She has a normal mood and affect. Her behavior is normal. Thought content normal.   A [redacted] weeks gestation  Medical screening exam complete  P Discharge from MAU in stable condition Follow up as scheduled in the office for prenatal care Labor precautions discussed and reasons to present back to MAU   Sharyon Cable, CNM 03/13/2020 1:44 PM

## 2020-03-13 NOTE — MAU Note (Signed)
Here for 2nd dose of Betamethasone. (premature dilation). No problems with first dose.  Had some mucous d/c last night. No complaints.

## 2020-03-14 ENCOUNTER — Inpatient Hospital Stay (HOSPITAL_COMMUNITY)
Admission: AD | Admit: 2020-03-14 | Discharge: 2020-03-14 | Disposition: A | Discharge status: Home / Self Care | Payer: Medicaid Other | Attending: Obstetrics and Gynecology | Admitting: Obstetrics and Gynecology

## 2020-03-14 ENCOUNTER — Encounter (HOSPITAL_COMMUNITY): Payer: Self-pay | Admitting: Obstetrics and Gynecology

## 2020-03-14 ENCOUNTER — Other Ambulatory Visit: Payer: Self-pay

## 2020-03-14 DIAGNOSIS — Z3A36 36 weeks gestation of pregnancy: Secondary | ICD-10-CM

## 2020-03-14 DIAGNOSIS — O479 False labor, unspecified: Secondary | ICD-10-CM

## 2020-03-14 DIAGNOSIS — O4703 False labor before 37 completed weeks of gestation, third trimester: Secondary | ICD-10-CM | POA: Insufficient documentation

## 2020-03-14 NOTE — Discharge Instructions (Signed)
Braxton Hicks Contractions °Contractions of the uterus can occur throughout pregnancy, but they are not always a sign that you are in labor. You may have practice contractions called Braxton Hicks contractions. These false labor contractions are sometimes confused with true labor. °What are Braxton Hicks contractions? °Braxton Hicks contractions are tightening movements that occur in the muscles of the uterus before labor. Unlike true labor contractions, these contractions do not result in opening (dilation) and thinning of the cervix. Toward the end of pregnancy (32-34 weeks), Braxton Hicks contractions can happen more often and may become stronger. These contractions are sometimes difficult to tell apart from true labor because they can be very uncomfortable. You should not feel embarrassed if you go to the hospital with false labor. °Sometimes, the only way to tell if you are in true labor is for your health care provider to look for changes in the cervix. The health care provider will do a physical exam and may monitor your contractions. If you are not in true labor, the exam should show that your cervix is not dilating and your water has not broken. °If there are no other health problems associated with your pregnancy, it is completely safe for you to be sent home with false labor. You may continue to have Braxton Hicks contractions until you go into true labor. °How to tell the difference between true labor and false labor °True labor °· Contractions last 30-70 seconds. °· Contractions become very regular. °· Discomfort is usually felt in the top of the uterus, and it spreads to the lower abdomen and low back. °· Contractions do not go away with walking. °· Contractions usually become more intense and increase in frequency. °· The cervix dilates and gets thinner. °False labor °· Contractions are usually shorter and not as strong as true labor contractions. °· Contractions are usually irregular. °· Contractions  are often felt in the front of the lower abdomen and in the groin. °· Contractions may go away when you walk around or change positions while lying down. °· Contractions get weaker and are shorter-lasting as time goes on. °· The cervix usually does not dilate or become thin. °Follow these instructions at home: ° °· Take over-the-counter and prescription medicines only as told by your health care provider. °· Keep up with your usual exercises and follow other instructions from your health care provider. °· Eat and drink lightly if you think you are going into labor. °· If Braxton Hicks contractions are making you uncomfortable: °? Change your position from lying down or resting to walking, or change from walking to resting. °? Sit and rest in a tub of warm water. °? Drink enough fluid to keep your urine pale yellow. Dehydration may cause these contractions. °? Do slow and deep breathing several times an hour. °· Keep all follow-up prenatal visits as told by your health care provider. This is important. °Contact a health care provider if: °· You have a fever. °· You have continuous pain in your abdomen. °Get help right away if: °· Your contractions become stronger, more regular, and closer together. °· You have fluid leaking or gushing from your vagina. °· You pass blood-tinged mucus (bloody show). °· You have bleeding from your vagina. °· You have low back pain that you never had before. °· You feel your baby’s head pushing down and causing pelvic pressure. °· Your baby is not moving inside you as much as it used to. °Summary °· Contractions that occur before labor are   called Braxton Hicks contractions, false labor, or practice contractions. °· Braxton Hicks contractions are usually shorter, weaker, farther apart, and less regular than true labor contractions. True labor contractions usually become progressively stronger and regular, and they become more frequent. °· Manage discomfort from Braxton Hicks contractions  by changing position, resting in a warm bath, drinking plenty of water, or practicing deep breathing. °This information is not intended to replace advice given to you by your health care provider. Make sure you discuss any questions you have with your health care provider. °Document Revised: 10/02/2017 Document Reviewed: 03/05/2017 °Elsevier Patient Education © 2020 Elsevier Inc. ° °

## 2020-03-14 NOTE — MAU Note (Signed)
PT SAYS UC STRONG  SINCE 0200.  VE IN OFFICE  3 CM - DR Mindi Slicker.  DENIES HSV AND MRSA. GBS- POSITIVE

## 2020-03-14 NOTE — MAU Provider Note (Signed)
S: Ms. Cheryl Acevedo is a 29 y.o. G3P1011 at [redacted]w[redacted]d  who presents to MAU today for labor evaluation.     Cervical exam by RN:  Dilation: 3 Effacement (%): 70 Station: -2 Presentation: Vertex Exam by:: Latricia Heft, RN  Fetal Monitoring: Baseline: 140 Variability: average Accelerations: present Decelerations: absent Contractions: irregular  MDM Discussed patient with RN. NST reviewed.   A: SIUP at [redacted]w[redacted]d  False labor  P: Discharge home Labor precautions and kick counts included in AVS Patient to follow-up with office as scheduled  Patient may return to MAU as needed or when in labor   Valora Piccolo 03/14/2020 5:36 AM

## 2020-03-26 ENCOUNTER — Inpatient Hospital Stay (HOSPITAL_COMMUNITY)
Admission: AD | Admit: 2020-03-26 | Discharge: 2020-03-28 | DRG: 798 | Disposition: A | Discharge status: Home / Self Care | Payer: Medicaid Other | Attending: Obstetrics and Gynecology | Admitting: Obstetrics and Gynecology

## 2020-03-26 ENCOUNTER — Inpatient Hospital Stay (HOSPITAL_COMMUNITY): Payer: Medicaid Other | Admitting: Anesthesiology

## 2020-03-26 ENCOUNTER — Encounter (HOSPITAL_COMMUNITY)
Admission: AD | Disposition: A | Discharge status: Home / Self Care | Payer: Self-pay | Source: Home / Self Care | Attending: Obstetrics and Gynecology

## 2020-03-26 ENCOUNTER — Other Ambulatory Visit: Payer: Self-pay

## 2020-03-26 ENCOUNTER — Encounter (HOSPITAL_COMMUNITY): Payer: Self-pay | Admitting: Obstetrics and Gynecology

## 2020-03-26 DIAGNOSIS — F319 Bipolar disorder, unspecified: Secondary | ICD-10-CM | POA: Diagnosis present

## 2020-03-26 DIAGNOSIS — Z3A38 38 weeks gestation of pregnancy: Secondary | ICD-10-CM | POA: Diagnosis not present

## 2020-03-26 DIAGNOSIS — Z302 Encounter for sterilization: Secondary | ICD-10-CM

## 2020-03-26 DIAGNOSIS — Z20822 Contact with and (suspected) exposure to covid-19: Secondary | ICD-10-CM | POA: Diagnosis present

## 2020-03-26 DIAGNOSIS — O99344 Other mental disorders complicating childbirth: Secondary | ICD-10-CM | POA: Diagnosis present

## 2020-03-26 DIAGNOSIS — F419 Anxiety disorder, unspecified: Secondary | ICD-10-CM | POA: Diagnosis present

## 2020-03-26 DIAGNOSIS — O26893 Other specified pregnancy related conditions, third trimester: Secondary | ICD-10-CM | POA: Diagnosis present

## 2020-03-26 DIAGNOSIS — O479 False labor, unspecified: Secondary | ICD-10-CM

## 2020-03-26 DIAGNOSIS — O99824 Streptococcus B carrier state complicating childbirth: Principal | ICD-10-CM | POA: Diagnosis present

## 2020-03-26 DIAGNOSIS — Z9851 Tubal ligation status: Secondary | ICD-10-CM

## 2020-03-26 HISTORY — DX: Sprain of anterior cruciate ligament of unspecified knee, initial encounter: S83.519A

## 2020-03-26 HISTORY — DX: Tubal ligation status: Z98.51

## 2020-03-26 HISTORY — PX: TUBAL LIGATION: SHX77

## 2020-03-26 LAB — RPR: RPR Ser Ql: NONREACTIVE

## 2020-03-26 LAB — CBC
HCT: 29.3 % — ABNORMAL LOW (ref 36.0–46.0)
Hemoglobin: 9.5 g/dL — ABNORMAL LOW (ref 12.0–15.0)
MCH: 26 pg (ref 26.0–34.0)
MCHC: 32.4 g/dL (ref 30.0–36.0)
MCV: 80.3 fL (ref 80.0–100.0)
Platelets: 273 10*3/uL (ref 150–400)
RBC: 3.65 MIL/uL — ABNORMAL LOW (ref 3.87–5.11)
RDW: 13.4 % (ref 11.5–15.5)
WBC: 8.8 10*3/uL (ref 4.0–10.5)
nRBC: 0 % (ref 0.0–0.2)

## 2020-03-26 LAB — TYPE AND SCREEN
ABO/RH(D): A POS
Antibody Screen: NEGATIVE

## 2020-03-26 LAB — SARS CORONAVIRUS 2 BY RT PCR (HOSPITAL ORDER, PERFORMED IN ~~LOC~~ HOSPITAL LAB): SARS Coronavirus 2: NEGATIVE

## 2020-03-26 LAB — ABO/RH: ABO/RH(D): A POS

## 2020-03-26 LAB — OB RESULTS CONSOLE GBS: GBS: POSITIVE

## 2020-03-26 SURGERY — LIGATION, FALLOPIAN TUBE, POSTPARTUM
Anesthesia: Epidural

## 2020-03-26 MED ORDER — DIBUCAINE (PERIANAL) 1 % EX OINT: 1.0000 "application " | TOPICAL_OINTMENT | CUTANEOUS | Status: DC | PRN

## 2020-03-26 MED ORDER — ONDANSETRON HCL 4 MG/2ML IJ SOLN: 4.0000 mg | INTRAMUSCULAR | Status: DC | PRN

## 2020-03-26 MED ORDER — TERBUTALINE SULFATE 1 MG/ML IJ SOLN: 0.2500 mg | Freq: Once | INTRAMUSCULAR | Status: DC | PRN

## 2020-03-26 MED ORDER — SENNOSIDES-DOCUSATE SODIUM 8.6-50 MG PO TABS
2.0000 | ORAL_TABLET | ORAL | Status: DC
  Administered 2020-03-27: 2 via ORAL
  Filled 2020-03-26 (×2): qty 2

## 2020-03-26 MED ORDER — TETANUS-DIPHTH-ACELL PERTUSSIS 5-2.5-18.5 LF-MCG/0.5 IM SUSP: 0.5000 mL | Freq: Once | INTRAMUSCULAR | Status: DC

## 2020-03-26 MED ORDER — EPHEDRINE 5 MG/ML INJ: 10.0000 mg | INTRAVENOUS | Status: DC | PRN

## 2020-03-26 MED ORDER — BENZOCAINE-MENTHOL 20-0.5 % EX AERO
1.0000 "application " | INHALATION_SPRAY | CUTANEOUS | Status: DC | PRN
  Administered 2020-03-28: 1 via TOPICAL
  Filled 2020-03-26: qty 56

## 2020-03-26 MED ORDER — LIDOCAINE HCL (PF) 1 % IJ SOLN
INTRAMUSCULAR | Status: DC | PRN
  Administered 2020-03-26 (×2): 4 mL via EPIDURAL

## 2020-03-26 MED ORDER — OXYCODONE HCL 5 MG/5ML PO SOLN: 5.0000 mg | Freq: Once | ORAL | Status: DC | PRN

## 2020-03-26 MED ORDER — BUTORPHANOL TARTRATE 1 MG/ML IJ SOLN: 1.0000 mg | INTRAMUSCULAR | Status: DC | PRN

## 2020-03-26 MED ORDER — OXYTOCIN 40 UNITS IN NORMAL SALINE INFUSION - SIMPLE MED
1.0000 m[IU]/min | INTRAVENOUS | Status: DC
  Administered 2020-03-26: 2 m[IU]/min via INTRAVENOUS
  Filled 2020-03-26: qty 1000

## 2020-03-26 MED ORDER — BUPIVACAINE HCL (PF) 0.25 % IJ SOLN
INTRAMUSCULAR | Status: AC
  Filled 2020-03-26: qty 30

## 2020-03-26 MED ORDER — FENTANYL CITRATE (PF) 100 MCG/2ML IJ SOLN
INTRAMUSCULAR | Status: DC | PRN
  Administered 2020-03-26: 100 ug via EPIDURAL

## 2020-03-26 MED ORDER — ONDANSETRON HCL 4 MG/2ML IJ SOLN
INTRAMUSCULAR | Status: DC | PRN
  Administered 2020-03-26: 4 mg via INTRAVENOUS

## 2020-03-26 MED ORDER — WITCH HAZEL-GLYCERIN EX PADS: 1.0000 "application " | MEDICATED_PAD | CUTANEOUS | Status: DC | PRN

## 2020-03-26 MED ORDER — OXYCODONE-ACETAMINOPHEN 5-325 MG PO TABS: 1.0000 | ORAL_TABLET | ORAL | Status: DC | PRN

## 2020-03-26 MED ORDER — SODIUM BICARBONATE 8.4 % IV SOLN
INTRAVENOUS | Status: DC | PRN
  Administered 2020-03-26 (×4): 5 mL via EPIDURAL

## 2020-03-26 MED ORDER — LACTATED RINGERS IV SOLN: INTRAVENOUS | Status: DC | PRN

## 2020-03-26 MED ORDER — OXYTOCIN 40 UNITS IN NORMAL SALINE INFUSION - SIMPLE MED: 2.5000 [IU]/h | INTRAVENOUS | Status: DC

## 2020-03-26 MED ORDER — SOD CITRATE-CITRIC ACID 500-334 MG/5ML PO SOLN
ORAL | Status: AC
  Filled 2020-03-26: qty 30

## 2020-03-26 MED ORDER — IBUPROFEN 600 MG PO TABS
600.0000 mg | ORAL_TABLET | Freq: Four times a day (QID) | ORAL | Status: DC
  Administered 2020-03-26 – 2020-03-28 (×6): 600 mg via ORAL
  Filled 2020-03-26 (×8): qty 1

## 2020-03-26 MED ORDER — OXYCODONE HCL 5 MG PO TABS: 10.0000 mg | ORAL_TABLET | ORAL | Status: DC | PRN

## 2020-03-26 MED ORDER — LIDOCAINE HCL (PF) 1 % IJ SOLN: 30.0000 mL | INTRAMUSCULAR | Status: DC | PRN

## 2020-03-26 MED ORDER — FLEET ENEMA 7-19 GM/118ML RE ENEM: 1.0000 | ENEMA | RECTAL | Status: DC | PRN

## 2020-03-26 MED ORDER — ACETAMINOPHEN 160 MG/5ML PO SOLN: 325.0000 mg | ORAL | Status: DC | PRN

## 2020-03-26 MED ORDER — DIPHENHYDRAMINE HCL 25 MG PO CAPS: 25.0000 mg | ORAL_CAPSULE | Freq: Four times a day (QID) | ORAL | Status: DC | PRN

## 2020-03-26 MED ORDER — SOD CITRATE-CITRIC ACID 500-334 MG/5ML PO SOLN: 30.0000 mL | ORAL | Status: DC | PRN

## 2020-03-26 MED ORDER — ARIPIPRAZOLE ER 400 MG IM PRSY: 400.0000 mg | PREFILLED_SYRINGE | INTRAMUSCULAR | Status: DC

## 2020-03-26 MED ORDER — PHENYLEPHRINE 40 MCG/ML (10ML) SYRINGE FOR IV PUSH (FOR BLOOD PRESSURE SUPPORT): 80.0000 ug | PREFILLED_SYRINGE | INTRAVENOUS | Status: DC | PRN

## 2020-03-26 MED ORDER — SODIUM CHLORIDE 0.9 % IV SOLN
5.0000 10*6.[IU] | Freq: Once | INTRAVENOUS | Status: AC
  Administered 2020-03-26: 5 10*6.[IU] via INTRAVENOUS
  Filled 2020-03-26: qty 5

## 2020-03-26 MED ORDER — SERTRALINE HCL 50 MG PO TABS
50.0000 mg | ORAL_TABLET | Freq: Every day | ORAL | Status: DC
  Administered 2020-03-27 – 2020-03-28 (×2): 50 mg via ORAL
  Filled 2020-03-26 (×3): qty 1

## 2020-03-26 MED ORDER — OXYCODONE HCL 5 MG PO TABS: 5.0000 mg | ORAL_TABLET | Freq: Once | ORAL | Status: DC | PRN

## 2020-03-26 MED ORDER — OXYCODONE-ACETAMINOPHEN 5-325 MG PO TABS: 2.0000 | ORAL_TABLET | ORAL | Status: DC | PRN

## 2020-03-26 MED ORDER — LACTATED RINGERS IV SOLN
500.0000 mL | INTRAVENOUS | Status: DC | PRN
  Administered 2020-03-26: 500 mL via INTRAVENOUS

## 2020-03-26 MED ORDER — ACETAMINOPHEN 325 MG PO TABS: 325.0000 mg | ORAL_TABLET | ORAL | Status: DC | PRN

## 2020-03-26 MED ORDER — FENTANYL CITRATE (PF) 100 MCG/2ML IJ SOLN: 25.0000 ug | INTRAMUSCULAR | Status: DC | PRN

## 2020-03-26 MED ORDER — OXYCODONE HCL 5 MG PO TABS
5.0000 mg | ORAL_TABLET | ORAL | Status: DC | PRN
  Administered 2020-03-27: 5 mg via ORAL
  Filled 2020-03-26: qty 1

## 2020-03-26 MED ORDER — LACTATED RINGERS IV SOLN: INTRAVENOUS | Status: DC

## 2020-03-26 MED ORDER — ONDANSETRON HCL 4 MG/2ML IJ SOLN: 4.0000 mg | Freq: Four times a day (QID) | INTRAMUSCULAR | Status: DC | PRN

## 2020-03-26 MED ORDER — PENICILLIN G POT IN DEXTROSE 60000 UNIT/ML IV SOLN
3.0000 10*6.[IU] | INTRAVENOUS | Status: DC
  Administered 2020-03-26: 3 10*6.[IU] via INTRAVENOUS
  Filled 2020-03-26: qty 50

## 2020-03-26 MED ORDER — PRENATAL MULTIVITAMIN CH
1.0000 | ORAL_TABLET | Freq: Every day | ORAL | Status: DC
  Administered 2020-03-27 – 2020-03-28 (×2): 1 via ORAL
  Filled 2020-03-26 (×2): qty 1

## 2020-03-26 MED ORDER — MEPERIDINE HCL 25 MG/ML IJ SOLN: 6.2500 mg | INTRAMUSCULAR | Status: DC | PRN

## 2020-03-26 MED ORDER — ONDANSETRON HCL 4 MG/2ML IJ SOLN: 4.0000 mg | Freq: Once | INTRAMUSCULAR | Status: DC | PRN

## 2020-03-26 MED ORDER — OXYTOCIN BOLUS FROM INFUSION
500.0000 mL | Freq: Once | INTRAVENOUS | Status: AC
  Administered 2020-03-26: 500 mL via INTRAVENOUS

## 2020-03-26 MED ORDER — COCONUT OIL OIL
1.0000 "application " | TOPICAL_OIL | Status: DC | PRN
  Administered 2020-03-28: 1 via TOPICAL

## 2020-03-26 MED ORDER — SIMETHICONE 80 MG PO CHEW: 80.0000 mg | CHEWABLE_TABLET | ORAL | Status: DC | PRN

## 2020-03-26 MED ORDER — LACTATED RINGERS IV SOLN: 500.0000 mL | Freq: Once | INTRAVENOUS | Status: DC

## 2020-03-26 MED ORDER — ACETAMINOPHEN 325 MG PO TABS: 650.0000 mg | ORAL_TABLET | ORAL | Status: DC | PRN

## 2020-03-26 MED ORDER — ONDANSETRON HCL 4 MG PO TABS: 4.0000 mg | ORAL_TABLET | ORAL | Status: DC | PRN

## 2020-03-26 MED ORDER — BUPIVACAINE HCL (PF) 0.25 % IJ SOLN
INTRAMUSCULAR | Status: DC | PRN
  Administered 2020-03-26: 10 mL

## 2020-03-26 MED ORDER — ZOLPIDEM TARTRATE 5 MG PO TABS: 5.0000 mg | ORAL_TABLET | Freq: Every evening | ORAL | Status: DC | PRN

## 2020-03-26 MED ORDER — DIPHENHYDRAMINE HCL 50 MG/ML IJ SOLN: 12.5000 mg | INTRAMUSCULAR | Status: DC | PRN

## 2020-03-26 MED ORDER — SODIUM CHLORIDE (PF) 0.9 % IJ SOLN
INTRAMUSCULAR | Status: DC | PRN
  Administered 2020-03-26: 12 mL/h via EPIDURAL

## 2020-03-26 MED ORDER — FENTANYL CITRATE (PF) 100 MCG/2ML IJ SOLN
INTRAMUSCULAR | Status: DC | PRN
  Administered 2020-03-26 (×2): 50 ug via INTRAVENOUS

## 2020-03-26 MED ORDER — FENTANYL-BUPIVACAINE-NACL 0.5-0.125-0.9 MG/250ML-% EP SOLN
12.0000 mL/h | EPIDURAL | Status: DC | PRN
  Filled 2020-03-26: qty 250

## 2020-03-26 MED ORDER — MIDAZOLAM HCL 2 MG/2ML IJ SOLN
INTRAMUSCULAR | Status: DC | PRN
  Administered 2020-03-26: 2 mg via INTRAVENOUS

## 2020-03-26 MED ORDER — OXYTOCIN 40 UNITS IN NORMAL SALINE INFUSION - SIMPLE MED: 1.0000 m[IU]/min | INTRAVENOUS | Status: DC

## 2020-03-26 SURGICAL SUPPLY — 28 items
APL SKNCLS STERI-STRIP NONHPOA (GAUZE/BANDAGES/DRESSINGS) ×1
BENZOIN TINCTURE PRP APPL 2/3 (GAUZE/BANDAGES/DRESSINGS) ×2 IMPLANT
BLADE SURG 11 STRL SS (BLADE) ×2 IMPLANT
CLOTH BEACON ORANGE TIMEOUT ST (SAFETY) ×2 IMPLANT
DRSG OPSITE POSTOP 3X4 (GAUZE/BANDAGES/DRESSINGS) ×2 IMPLANT
DURAPREP 26ML APPLICATOR (WOUND CARE) ×2 IMPLANT
ELECT REM PT RETURN 9FT ADLT (ELECTROSURGICAL) ×2
ELECTRODE REM PT RTRN 9FT ADLT (ELECTROSURGICAL) ×1 IMPLANT
GLOVE BIO SURGEON STRL SZ 6.5 (GLOVE) ×4 IMPLANT
GLOVE BIOGEL PI IND STRL 7.0 (GLOVE) ×1 IMPLANT
GLOVE BIOGEL PI INDICATOR 7.0 (GLOVE) ×1
GOWN STRL REUS W/TWL LRG LVL3 (GOWN DISPOSABLE) ×4 IMPLANT
NEEDLE HYPO 22GX1.5 SAFETY (NEEDLE) IMPLANT
NS IRRIG 1000ML POUR BTL (IV SOLUTION) ×2 IMPLANT
PACK ABDOMINAL MINOR (CUSTOM PROCEDURE TRAY) ×2 IMPLANT
PENCIL BUTTON HOLSTER BLD 10FT (ELECTRODE) IMPLANT
PROTECTOR NERVE ULNAR (MISCELLANEOUS) ×2 IMPLANT
SPONGE LAP 4X18 RFD (DISPOSABLE) ×2 IMPLANT
STRIP CLOSURE SKIN 1/2X4 (GAUZE/BANDAGES/DRESSINGS) ×2 IMPLANT
SUT PLAIN 0 NONE (SUTURE) ×2 IMPLANT
SUT VIC AB 0 CT1 27 (SUTURE) ×2
SUT VIC AB 0 CT1 27XBRD ANBCTR (SUTURE) ×1 IMPLANT
SUT VIC AB 0 CT1 36 (SUTURE) ×1 IMPLANT
SUT VIC AB 3-0 FS2 27 (SUTURE) ×2 IMPLANT
SUT VIC AB 4-0 PS2 27 (SUTURE) ×1 IMPLANT
SYR CONTROL 10ML LL (SYRINGE) IMPLANT
TOWEL OR 17X24 6PK STRL BLUE (TOWEL DISPOSABLE) ×4 IMPLANT
TRAY FOLEY CATH SILVER 14FR (SET/KITS/TRAYS/PACK) ×2 IMPLANT

## 2020-03-26 NOTE — MAU Provider Note (Signed)
S: Ms. Cheryl Acevedo is a 29 y.o. G3P1011 at [redacted]w[redacted]d  who presents to MAU today for labor evaluation.      Cervical exam by RN:  Dilation: 6 Effacement (%): 60 Cervical Position: Middle Station: -3 Presentation: Vertex Exam by:: Irish Elders, RN  Fetal Monitoring: Baseline: 130 Variability: average Accelerations: present Decelerations: absent Contractions: q 2-3 min  MDM Discussed patient with RN. NST reviewed.   A: SIUP at [redacted]w[redacted]d  Active Labor  P: Admit to Labor and Delivery Routine orders MD to follow   Aviva Signs, CNM 03/26/2020 3:14 AM

## 2020-03-26 NOTE — Lactation Note (Signed)
This note was copied from a baby's chart. Lactation Consultation Note Baby 11 hrs old in NICU. Mom was holding baby 1st time since delivery, mom has BTL today after delivery. Baby not cueing, has no interest in BF at this time. LC hand expressed spoon fed a few drops of colostrum. Baby became aggravated by the disturbance of his sleep. A few drops of colostrum on gloved finger for stimulation to suck, baby got mad. Attempted to latch baby wouldn't open.  Encouraged mom to try again tomorrow.  Baby's in NICU for respiratory distress. Mom stated baby aspirated some fluid d/t precipitous delivery of 3 pushes. Baby has g-tube in his mouth, and IV in his hand. Baby isn't receiving any tube feedings at this time.  Discussed pumping w/mom. Mom has pump set up and has pumped all ready, stated she didn't get anything out. Explained normal. Encouraged to pump every 3 hrs then hand express afterwards. Milk storage reviewed for NICU baby, NICU booklet and Lactation brochure given.  Mom didn't BF her first child d/t a medication she was on and isn't on that medication any longer.  Patient Name: Cheryl Acevedo [id number] Date: 03/26/2020 Reason for consult: Initial assessment;1st time breastfeeding;Early term 37-38.6wks;NICU baby   Maternal Data Has patient been taught Hand Expression?: Yes Does the patient have breastfeeding experience prior to this delivery?: No  Feeding Feeding Type: Breast Milk  LATCH Score Latch: Too sleepy or reluctant, no latch achieved, no sucking elicited.  Audible Swallowing: None  Type of Nipple: Everted at rest and after stimulation(flattens when compressed)  Comfort (Breast/Nipple): Soft / non-tender  Hold (Positioning): Full assist, staff holds infant at breast  LATCH Score: 4  Interventions Interventions: Breast feeding basics reviewed;Support pillows;Assisted with latch;Position options;Expressed milk;Breast massage;Hand express;Pre-pump if needed;Breast  compression;Adjust position  Lactation Tools Discussed/Used WIC Program: Yes   Consult Status Consult Status: Follow-up Date: 03/27/20 Follow-up type: In-patient    Charyl Dancer 03/26/2020, 10:30 PM

## 2020-03-26 NOTE — Anesthesia Postprocedure Evaluation (Signed)
Anesthesia Post Note  Patient: Cheryl Acevedo  Procedure(s) Performed: AN AD HOC LABOR EPIDURAL     Patient location during evaluation: Mother Baby Anesthesia Type: Epidural Level of consciousness: awake Pain management: satisfactory to patient Vital Signs Assessment: post-procedure vital signs reviewed and stable Respiratory status: spontaneous breathing Cardiovascular status: stable Anesthetic complications: no    Last Vitals:  Vitals:   03/26/20 1413 03/26/20 1623  BP: 115/78 120/78  Pulse: 66 60  Resp: 16 16  Temp: 36.8 C 36.8 C  SpO2: 100% 100%    Last Pain:  Vitals:   03/26/20 1623  TempSrc: Oral  PainSc:    Pain Goal: Patients Stated Pain Goal: 0 (03/26/20 0139)                 Cephus Shelling

## 2020-03-26 NOTE — Transfer of Care (Signed)
Immediate Anesthesia Transfer of Care Note  Patient: Cheryl Acevedo  Procedure(s) Performed: POST PARTUM TUBAL LIGATION (N/A )  Patient Location: PACU  Anesthesia Type:Spinal  Level of Consciousness: awake, alert  and oriented  Airway & Oxygen Therapy: Patient Spontanous Breathing  Post-op Assessment: Report given to RN and Post -op Vital signs reviewed and stable  Post vital signs: Reviewed and stable  Last Vitals:  Vitals Value Taken Time  BP 118/83 03/26/20 1927  Temp    Pulse    Resp 22 03/26/20 1929  SpO2    Vitals shown include unvalidated device data.  Last Pain:  Vitals:   03/26/20 1623  TempSrc: Oral  PainSc:       Patients Stated Pain Goal: 0 (03/26/20 0139)  Complications: No apparent anesthesia complications

## 2020-03-26 NOTE — Brief Op Note (Signed)
03/26/2020  7:53 PM  PATIENT:  Cheryl Acevedo  29 y.o. female  PRE-OPERATIVE DIAGNOSIS:  PP BTL  POST-OPERATIVE DIAGNOSIS:  PP BTL  PROCEDURE:  Procedure(s): POST PARTUM TUBAL LIGATION (N/A)  SURGEON:  Surgeon(s) and Role:    * Bovard-Stuckert, Hayward Rylander, MD - Primary  ASSISTANTS: Krietemeyer, Heather RNFA   ANESTHESIA:   local, epidural and IV sedation  EBL:  10 mL IVF and UOP peranesthesia  BLOOD ADMINISTERED:none  DRAINS: none   LOCAL MEDICATIONS USED:  MARCAINE     SPECIMEN:  Source of Specimen:  B tubal segments  DISPOSITION OF SPECIMEN:  PATHOLOGY  COUNTS:  YES  TOURNIQUET:  * No tourniquets in log *  DICTATION: .Other Dictation: Dictation Number [id number]  PLAN OF CARE: transfer back to MB  PATIENT DISPOSITION:  PACU - hemodynamically stable.   Delay start of Pharmacological VTE agent (>24hrs) due to surgical blood loss or risk of bleeding: not applicable

## 2020-03-26 NOTE — Anesthesia Preprocedure Evaluation (Signed)
Anesthesia Evaluation  Patient identified by MRN, date of birth, ID band Patient awake    Reviewed: Allergy & Precautions, NPO status , Patient's Chart, lab work & pertinent test results  History of Anesthesia Complications Negative for: history of anesthetic complications  Airway Mallampati: II  TM Distance: >3 FB Neck ROM: Full    Dental no notable dental hx.    Pulmonary neg pulmonary ROS,    Pulmonary exam normal        Cardiovascular negative cardio ROS Normal cardiovascular exam     Neuro/Psych Anxiety Depression Bipolar Disorder negative neurological ROS  negative psych ROS   GI/Hepatic negative GI ROS, Neg liver ROS,   Endo/Other  negative endocrine ROS  Renal/GU negative Renal ROS  negative genitourinary   Musculoskeletal negative musculoskeletal ROS (+)   Abdominal   Peds negative pediatric ROS (+)  Hematology negative hematology ROS (+) Blood dyscrasia, anemia , Hgb 9.5   Anesthesia Other Findings Day of surgery medications reviewed with patient.  Reproductive/Obstetrics negative OB ROS (+) Pregnancy                             Anesthesia Physical  Anesthesia Plan  ASA: II  Anesthesia Plan: Epidural   Post-op Pain Management:    Induction:   PONV Risk Score and Plan: Treatment may vary due to age or medical condition  Airway Management Planned: Natural Airway  Additional Equipment:   Intra-op Plan:   Post-operative Plan:   Informed Consent: I have reviewed the patients History and Physical, chart, labs and discussed the procedure including the risks, benefits and alternatives for the proposed anesthesia with the patient or authorized representative who has indicated his/her understanding and acceptance.     Dental Advisory Given  Plan Discussed with:   Anesthesia Plan Comments: (Labs checked- platelets confirmed with RN in room. Fetal heart tracing, per  RN, reported to be stable enough for sitting procedure. Discussed epidural, and patient consents to the procedure:  included risk of possible headache,backache, failed block, allergic reaction, and nerve injury. This patient was asked if she had any questions or concerns before the procedure started.)        Anesthesia Quick Evaluation

## 2020-03-26 NOTE — Progress Notes (Signed)
Patient ID: Cheryl Acevedo, female   DOB: 17-Oct-1991, 29 y.o.   MRN: [id number]   Comfortable with epidural  AFVSS gen NAD FHTs 110's, mod var, + scalp stim, + accel, category 1 toco irr  AROM for small clear flui, confirms vertex  Will start pitocin to augment  Expect SVD

## 2020-03-26 NOTE — Anesthesia Procedure Notes (Signed)
Epidural Patient location during procedure: OB Start time: 03/26/2020 5:01 AM End time: 03/26/2020 5:04 AM  Staffing Anesthesiologist: Kaylyn Layer, MD Performed: anesthesiologist   Preanesthetic Checklist Completed: patient identified, IV checked, risks and benefits discussed, monitors and equipment checked, pre-op evaluation and timeout performed  Epidural Patient position: sitting Prep: DuraPrep and site prepped and draped Patient monitoring: continuous pulse ox, blood pressure and heart rate Approach: midline Location: L3-L4 Injection technique: LOR air  Needle:  Needle type: Tuohy  Needle gauge: 17 G Needle length: 9 cm Catheter type: closed end flexible Catheter size: 19 Gauge Catheter at skin depth: 9 cm Test dose: negative and Other (1% lidocaine)  Assessment Events: blood not aspirated, injection not painful, no injection resistance, no paresthesia and negative IV test  Additional Notes Patient identified. Risks, benefits, and alternatives discussed with patient including but not limited to bleeding, infection, nerve damage, paralysis, failed block, incomplete pain control, headache, blood pressure changes, nausea, vomiting, reactions to medication, itching, and postpartum back pain. Confirmed with bedside nurse the patient's most recent platelet count. Confirmed with patient that they are not currently taking any anticoagulation, have any bleeding history, or any family history of bleeding disorders. Patient expressed understanding and wished to proceed. All questions were answered. Sterile technique was used throughout the entire procedure. Please see nursing notes for vital signs.   Crisp LOR on first pass. Test dose was given through epidural catheter and negative prior to continuing to dose epidural or start infusion. Warning signs of high block given to the patient including shortness of breath, tingling/numbness in hands, complete motor block, or any concerning  symptoms with instructions to call for help. Patient was given instructions on fall risk and not to get out of bed. All questions and concerns addressed with instructions to call with any issues or inadequate analgesia.  Reason for block:procedure for pain

## 2020-03-26 NOTE — Anesthesia Preprocedure Evaluation (Addendum)
Anesthesia Evaluation  Patient identified by MRN, date of birth, ID band Patient awake    Reviewed: Allergy & Precautions, Patient's Chart, lab work & pertinent test results  History of Anesthesia Complications Negative for: history of anesthetic complications  Airway Mallampati: II  TM Distance: >3 FB Neck ROM: Full    Dental no notable dental hx.    Pulmonary neg pulmonary ROS,    Pulmonary exam normal        Cardiovascular negative cardio ROS Normal cardiovascular exam     Neuro/Psych Anxiety Depression Bipolar Disorder negative neurological ROS     GI/Hepatic negative GI ROS, Neg liver ROS,   Endo/Other  negative endocrine ROS  Renal/GU negative Renal ROS  negative genitourinary   Musculoskeletal negative musculoskeletal ROS (+)   Abdominal   Peds  Hematology  (+) anemia , Hgb 9.5   Anesthesia Other Findings Day of surgery medications reviewed with patient.  Reproductive/Obstetrics (+) Pregnancy                            Anesthesia Physical Anesthesia Plan  ASA: II  Anesthesia Plan: Epidural   Post-op Pain Management:    Induction:   PONV Risk Score and Plan: Treatment may vary due to age or medical condition  Airway Management Planned: Natural Airway  Additional Equipment:   Intra-op Plan:   Post-operative Plan:   Informed Consent: I have reviewed the patients History and Physical, chart, labs and discussed the procedure including the risks, benefits and alternatives for the proposed anesthesia with the patient or authorized representative who has indicated his/her understanding and acceptance.       Plan Discussed with:   Anesthesia Plan Comments:         Anesthesia Quick Evaluation

## 2020-03-26 NOTE — H&P (Signed)
Cheryl Acevedo is a 29 y.o. female G3P1011 at 4 3/7 weeks (EDD 04/06/20 by LMP c/w 6 week [place]) presenting for painful contractions and cervical change to 5-6 cm.   Prenatal care significant for early dilation to 3 cm and received betamethasone at 36 weeks when noted to be 2-3 cm dilated.  She was 4cm on last office check. She is on abilify and zoloftfor anxiety/bipolar and stable on those meds. She took vyvanse prior to pregnancy but is not currently.  She is GBS positive. She would like a postpartum tubal sterilization and has signed papers for this.  OB History    Gravida  3   Para  1   Term  1   Preterm      AB  1   Living  1     SAB      TAB  1   Ectopic      Multiple  0   Live Births  1          Past Pregnancies 06-03-2016, 7 wks  EAB  04-22-2017, 39.3 wks 1. F, 7lbs 10oz, Vaginal Delivery  Past Medical History:  Diagnosis Date  . Anxiety   . Bipolar disorder (HCC)   . Depression   . Obsessive compulsive disorder   . Paranoia (HCC)   . Scoliosis    Past Surgical History:  Procedure Laterality Date  . COSMETIC SURGERY    . WISDOM TOOTH EXTRACTION Bilateral 2000   x4   Family History: family history includes Cancer in her mother and another family member; Diabetes in an other family member. Social History:  reports that she has never smoked. She has never used smokeless tobacco. She reports that she does not drink alcohol or use drugs.     Maternal Diabetes: No Genetic Screening: Normal Maternal Ultrasounds/Referrals: Normal Fetal Ultrasounds or other Referrals:  None Maternal Substance Abuse:  No Significant Maternal Medications:  Meds include: Other: zoloft, abilify Significant Maternal Lab Results:  Group B Strep positive Other Comments:  None  Review of Systems  Constitutional: Negative for fever.  Gastrointestinal: Positive for abdominal pain.  Musculoskeletal:       Left knee pain off and on from torn ACL in past   Maternal Medical  History:  Reason for admission: Contractions.   Contractions: Onset was 3-5 hours ago.   Frequency: regular.   Perceived severity is moderate.    Fetal activity: Perceived fetal activity is normal.    Prenatal complications: Preterm labor.   GBS positive, bipolar  Prenatal Complications - Diabetes: none.    Dilation: 6 Effacement (%): 60 Station: -3 Exam by:: Irish Elders, RN Blood pressure 126/83, pulse 90, temperature (!) 97.5 F (36.4 C), temperature source Oral, resp. rate 20, weight 93.1 kg, last menstrual period 06/15/2019, unknown if currently breastfeeding. Maternal Exam:  Uterine Assessment: Contraction strength is moderate.  Contraction frequency is regular.   Abdomen: Patient reports no abdominal tenderness. Fetal presentation: vertex  Introitus: Normal vulva. Normal vagina.    Physical Exam  Constitutional: She appears well-developed.  Cardiovascular: Normal rate and regular rhythm.  Respiratory: Effort normal.  GI: Soft.  Genitourinary:    Vulva and vagina normal.   Neurological: She is alert.  Psychiatric: She has a normal mood and affect.    Prenatal labs: ABO, Rh:  A Positive Antibody:  Neg Rubella: Immune (11/06 0000) RPR: Nonreactive (11/06 0000)  HBsAg: Negative (11/06 0000)  HIV: Non-reactive (03/09 0000)  GBS: Positive/-- (05/24 0000)  One  hour GCT 150 Three hour GTT WNL Panorama low risk Essential panel negative last pregnancy   Assessment/Plan: Pt admitted with cervical change to 5-6cm.    PCN just now started for +GBS at 0415--AROM after PCN on board  Desires pp BTL, d/w pt and will get epidural--understands depends on OR schedule. Appears to be in latent phase labor  Logan Bores 03/26/2020, 4:03 AM

## 2020-03-26 NOTE — MAU Note (Signed)
Pt reports to MAU c/o ctx every 6ish min. No bleeding or LOF. +FM.  Pain a 7/10.

## 2020-03-26 NOTE — Progress Notes (Signed)
Patient ID: Cheryl Acevedo, female   DOB: December 11, 1990, 29 y.o.   MRN: [id number]  Pt seen preoperatively in Short Stay.  Desires permanent sterilization.  D/W pt r/b/a, will proceed.

## 2020-03-26 NOTE — Progress Notes (Signed)
Patient ID: Cheryl Acevedo, female   DOB: May 21, 1991, 29 y.o.   MRN: [id number] Comfortable with epidural, thinks may have felt fluid leak   afeb VSS FHR category 1  80/6/-1 Feels intact  Pt comfortable, d/w her will AROM when closer to 4 hour mark for PCN

## 2020-03-27 LAB — CBC
HCT: 25.4 % — ABNORMAL LOW (ref 36.0–46.0)
Hemoglobin: 8.2 g/dL — ABNORMAL LOW (ref 12.0–15.0)
MCH: 26.3 pg (ref 26.0–34.0)
MCHC: 32.3 g/dL (ref 30.0–36.0)
MCV: 81.4 fL (ref 80.0–100.0)
Platelets: 228 10*3/uL (ref 150–400)
RBC: 3.12 MIL/uL — ABNORMAL LOW (ref 3.87–5.11)
RDW: 13.7 % (ref 11.5–15.5)
WBC: 8.3 10*3/uL (ref 4.0–10.5)
nRBC: 0 % (ref 0.0–0.2)

## 2020-03-27 NOTE — Lactation Note (Signed)
This note was copied from a baby's chart. Lactation Consultation Note  Patient Name: Cheryl Acevedo [id number] Date: 03/27/2020 Reason for consult: Follow-up assessment;1st time breastfeeding;NICU baby;Early term 37-38.6wks  LC in to assist P2 Mom of ET infant in the NICU.  Baby 22 hrs old and has had one breastfeeding attempt earlier this am.    Mom has been pumping, one breast at a time, and last pumping was 2 hrs ago.  Unable to express more than a few drops yet.  Baby placed STS at breast.  No rooting noted.  On finger, baby not interested in sucking.  Hand expression unable to express a drop currently.    Placed baby STS on Mom's chest.  Mom instructed to watch for feeding cues and call baby's nurse.    Mom plans to be discharged today, and move up to baby's room.  Mom knows to bring her pump parts with her to use the Medela Symphony pump in baby's room.   Interventions Interventions: Breast feeding basics reviewed;Skin to skin;Assisted with latch;Breast massage;Hand express;Support pillows;Position options;DEBP  Lactation Tools Discussed/Used Tools: Pump Breast pump type: Double-Electric Breast Pump   Consult Status Consult Status: Follow-up Date: 03/28/20 Follow-up type: In-patient    Cheryl Acevedo 03/27/2020, 9:54 AM

## 2020-03-27 NOTE — Progress Notes (Signed)
POSTPARTUM PROGRESS NOTE  Post Partum Day #1  Subjective:  No acute events overnight.  Pt denies problems with ambulating, voiding or po intake.  She denies nausea or vomiting.  Pain is well controlled.  She has had flatus. She has not had bowel movement.  Lochia Minimal. POD#1 s/p BTL, no complaints about incision Baby boy in NICU, now on IV abx for presumed PNA based on CXR findings  Objective: Blood pressure 118/66, pulse 72, temperature 98.3 F (36.8 C), temperature source Oral, resp. rate 18, weight 93.1 kg, last menstrual period 06/15/2019, SpO2 100 %, unknown if currently breastfeeding.  Physical Exam:  General: alert, cooperative and no distress Lochia:normal flow Chest: CTAB Heart: RRR no m/r/g Abdomen: +BS, soft, nontender. Honeycomb Uterine Fundus: firm, 2cm below umbilicus Extremities: neg edema, neg calf TTP BL, neg Homans BL  Recent Labs    03/26/20 0406 03/27/20 0504  HGB 9.5* 8.2*  HCT 29.3* 25.4*    Assessment/Plan:  ASSESSMENT: Cheryl Acevedo is a 29 y.o. M2X1155 s/p SVD @ [redacted]w[redacted]d. POD#1 s/p pp BTL. PNC c/b Anxiety/bipolar, satisfied fertility.   Plan for discharge tomorrow, Breastfeeding and Circumcision prior to discharge  Baby boy still in NICU, pending circ based on recs. DC tomorrow   LOS: 1 day

## 2020-03-27 NOTE — Lactation Note (Signed)
This note was copied from a baby's chart. Lactation Consultation Note  Patient Name: Cheryl Acevedo [id number] Date: 03/27/2020 Reason for consult: Follow-up assessment;Early term 37-38.6wks;NICU baby;1st time breastfeeding   Baby 26 hrs old.  Baby had 2 emesis this am and is now showing feeding cues.  LC in to assist with positioning and latching baby to breast.  Mom has short nipple shafts, and very compressible areola.    Mom needing much guidance and instruction on use of breast support/sandwiching and support of baby's head to control the latch.  Baby positioned in football on left breast but Mom was feeling uncomfortable with this.  Switched to cross cradle and baby opened his mouth widely and with guidance and sandwiching of breast, baby latched deeply and breastfed with deep jaw extensions and swallows identified, for 10 mins.  Baby came off and burped and then tried in football hold on right breast.  Hand expressed drops of colostrum to entice baby and he opened and latched deeply again.  Mom feeling uterine cramping during the feeding.  Lots of teaching done on what to expect the first few days of breastfeeding.  Mom has her pump parts in baby's room.    Mom to focus on baby latching rather than a lot of pumping currently.  If she is separated from baby, encouraged Mom to pump every 2-3 hrs.  Encouraged STS often as well.   Feeding Feeding Type: Breast Fed  LATCH Score Latch: Repeated attempts needed to sustain latch, nipple held in mouth throughout feeding, stimulation needed to elicit sucking reflex.  Audible Swallowing: Spontaneous and intermittent  Type of Nipple: Everted at rest and after stimulation(short nipple shafts, compressible areola)  Comfort (Breast/Nipple): Soft / non-tender  Hold (Positioning): Assistance needed to correctly position infant at breast and maintain latch.  LATCH Score: 8  Interventions Interventions: Breast feeding basics reviewed;Assisted  with latch;Skin to skin;Breast massage;Hand express;Breast compression;Adjust position;Support pillows;Position options;Expressed milk;DEBP  Lactation Tools Discussed/Used Tools: Pump Breast pump type: Double-Electric Breast Pump   Consult Status Consult Status: Follow-up Date: 03/28/20 Follow-up type: In-patient    [name] 03/27/2020, 1:47 PM

## 2020-03-27 NOTE — Anesthesia Postprocedure Evaluation (Signed)
Anesthesia Post Note  Patient: Cheryl Acevedo  Procedure(s) Performed: POST PARTUM TUBAL LIGATION (N/A )     Anesthesia Type: Epidural    Last Vitals:  Vitals:   03/26/20 2138 03/26/20 2307  BP: 113/74 127/65  Pulse: 76 91  Resp:  18  Temp: 36.5 C 36.7 C  SpO2: 99% 99%    Last Pain:  Vitals:   03/27/20 0054  TempSrc:   PainSc: 0-No pain   Pain Goal: Patients Stated Pain Goal: 0 (03/26/20 0139)                 Ryott Rafferty

## 2020-03-27 NOTE — Op Note (Signed)
NAME: Cheryl, Acevedo MEDICAL RECORD QI:[id number] ACCOUNT [id number] DATE OF BIRTH:02-06-1991 FACILITY: [facility] LOCATION: [facility] PHYSICIAN:Bethanne Mule BOVARD-STUCKERT, MD  OPERATIVE REPORT  DATE OF PROCEDURE:  03/26/2020  PREOPERATIVE DIAGNOSIS:  Status post spontaneous vaginal delivery with undesired fertility.  POSTOPERATIVE DIAGNOSIS:   1.  Status post spontaneous vaginal delivery with undesired fertility. 2.  Status post bilateral tubal ligation via bilateral salpingectomy.  PROCEDURE:  Postpartum bilateral tubal ligation by bilateral salpingectomy.  SURGEON:  Sherian Rein, MD  ASSISTANT:  Webb Silversmith, RNFA.  ANESTHESIA:  Local epidural and IV sedation.  ESTIMATED BLOOD LOSS:  Approximately 10 mL  IV FLUIDS AND URINE OUTPUT:  Per anesthesia.  COMPLICATIONS:  None.  PATHOLOGY:  Bilateral tubal segments.  DESCRIPTION OF PROCEDURE:  After informed consent was reviewed with the patient including risks, benefits and alternatives, her epidural was redosed and she was moved to the operating room table.  A Foley catheter had been maintained since her vaginal  delivery.  She was prepped and draped in the normal sterile fashion.  After an appropriate timeout was performed, an approximately 2 cm infraumbilical incision was made, carried through the underlying layer of fascia sharply.  The fascia was then  elevated with Kocher clamps and incised.  The peritoneum was entered bluntly using [employer] retractors.  The bed was airplaned and put in Trendelenburg.  Her tubes were identified, followed out to the fimbriated end and isolated with a Kelly, removed  and doubly ligated with plain gut bilaterally.  These were noted to be hemostatic.  The fascia was then closed and the skin was closed with 4-0 Vicryl in a subcuticular fashion.  The patient tolerated the procedure well.  Sponge, lap, and needle count  was correct x2 per the operating room staff.  VN/NUANCE  D:03/26/2020  T:03/26/2020 JOB:[id number]

## 2020-03-28 ENCOUNTER — Ambulatory Visit: Payer: Self-pay

## 2020-03-28 LAB — SURGICAL PATHOLOGY

## 2020-03-28 MED ORDER — OXYCODONE-ACETAMINOPHEN 5-325 MG PO TABS: 1.0000 | ORAL_TABLET | Freq: Three times a day (TID) | ORAL | 0 refills | Status: AC | PRN

## 2020-03-28 MED ORDER — IBUPROFEN 600 MG PO TABS: 600.0000 mg | ORAL_TABLET | Freq: Four times a day (QID) | ORAL | 1 refills | Status: DC | PRN

## 2020-03-28 MED FILL — IBUPROFEN 600 MG TABLET: 600 | 10 days supply | Qty: 40 | Fill #0

## 2020-03-28 MED FILL — OXYCODONE-APAP 5-325MG: 5-325 | 5 days supply | Qty: 15 | Fill #0

## 2020-03-28 NOTE — Lactation Note (Signed)
This note was copied from a baby's chart. Lactation Consultation Note  Patient Name: Cheryl Acevedo Cheryl Acevedo Date: 03/28/2020 Reason for consult: Follow-up assessment;Mother's request;Difficult latch;NICU baby;Late-preterm 34-36.6wks  1230 - 1245 - I reported to Ms. Bergeman' room to assist with feeding. Baby Cheryl Acevedo was on the right breast in cradle hold upon entry, and he was rooting a bit. I assisted with sandwiching her nipple into the baby's mouth, showing Ms. Streetman how to make a "U" hold. Her nipple is short, but compressible, and he briefly grasped the nipple with negative pressure (suction). However, he never engaged in suckling sequences.  I then attempted with the nipple shield in the room to see if this might stimulate a suckle reflex. I showed Ms. Marschke and the RN shadow how to properly place a nipple shield. Baby at this stage briefly opened for the shield, but he was too sleepy to fully grasp and engage in the breast.  Ms. Petronio will now pump to stimulate the breasts. Her breasts appeared to be filling; she has good veining, and she feels "tingling" sensations, and feels that her milk may be transitioning. I reiterated yesterday's LC recommendation of breast feeding first and pumping after it baby requires supplementation.  Ms. Droz had a good milk supply with her first child.   She asked that I return again for the 3 pm feeding to see if baby is ready to latch.  Maternal Data Has patient been taught Hand Expression?: Yes Does the patient have breastfeeding experience prior to this delivery?: Yes  Feeding Feeding Type: Donor Breast Milk Nipple Type: Nfant Extra Slow Flow (gold)  LATCH Score Latch: Too sleepy or reluctant, no latch achieved, no sucking elicited.  Audible Swallowing: None  Type of Nipple: Everted at rest and after stimulation  Comfort (Breast/Nipple): Soft / non-tender  Hold (Positioning): Assistance needed to correctly position infant at breast and maintain  latch.  LATCH Score: 5  Interventions Interventions: Breast feeding basics reviewed;Skin to skin;Hand express;Breast compression;Adjust position;Support pillows  Lactation Tools Discussed/Used Pump Review: Setup, frequency, and cleaning   Consult Status Consult Status: Follow-up Date: 03/28/20 Follow-up type: In-patient    Walker Shadow 03/28/2020, 1:09 PM

## 2020-03-28 NOTE — Progress Notes (Addendum)
Post Partum Day 2 Subjective: no complaints, up ad lib, voiding, tolerating PO, + flatus and lochia mild. Pt reports pain well controlled with medication. She denies HA, lightheadedness, CP or SOB. She is bonding well with baby. No pneumonia noted! Working on breastfeeding with nipple shields; supplementing with bottlefeeds.   Objective: Blood pressure 113/73, pulse 68, temperature 98.3 F (36.8 C), temperature source Oral, resp. rate 18, weight 93.1 kg, last menstrual period 06/15/2019, SpO2 100 %, unknown if currently breastfeeding.  Physical Exam:  General: alert, cooperative and no distress Lochia: appropriate Uterine Fundus: firm Incision: c/d/i DVT Evaluation: No evidence of DVT seen on physical exam.  Recent Labs    03/26/20 0406 03/27/20 0504  HGB 9.5* 8.2*  HCT 29.3* 25.4*    Assessment/Plan: Discharge home, Lactation consult, Circumcision prior to discharge and Contraception BTL - done  Reviewed discharge instructions Plans to nest in NICU with baby Rx sent to hospital pharmacy   LOS: 2 days   Cheryl Acevedo 03/28/2020, 10:12 AM

## 2020-03-28 NOTE — Progress Notes (Signed)
Pt discharged after discharge instructions and prescriptions given by pharmacy. All questions answered. Pt discharged in stable condition with all belongings.

## 2020-03-28 NOTE — Lactation Note (Signed)
This note was copied from a baby's chart. Lactation Consultation Note  Patient Name: Cheryl Acevedo [id number]'D Date: 03/28/2020 Reason for consult: Follow-up assessment;Mother's request;Difficult latch;Early term 37-38.6wks;NICU baby  1430 - 1500 - RN called LC back to room as baby Anette Riedel was cueing to feed. I assisted with helping Cheryl Acevedo latch baby in cross cradle hold on the right breast. She still finds positioning baby a bit awkward, and I helped quite a bit with latching. Baby initially latched without the use of a nipple shield. I sandwiched the nipple and held nipple for first suckling sequences, and then he was able to maintain without additional support.   I then fed him with a 5 [place] feeding tube with 3.5 mls of donor milk at the breast. He pulled 1/2 ml though syringe and then gagged a bit and released the breast.   We burped baby, and he began to cue again. This time we added a 24 mm NS to see if he could maintain his latch a bit longer. He was able to maintain, and I fed him the remainder of the 3 mls through the 5 [place] feeding tube, and baby tolerated this well.  After about 10 minutes of attempt, he began to refuse the breast, but show hunger cues. Cheryl Acevedo fed by bottle the remainder of the feeding.  We discussed positive aspects of the feeding including baby's ability to latch without a shield and have good suckling sequences. He also was able to pull milk though the feeding tube/syringe. His fussiness towards the end of the feeding may be a result of him tiring and need for follow up by bottle. Ms. Bunnell' milk has not transitioned at this time, but she is feeling breast changes.  I recommended that she continue to do STS and lick and learn and allow baby to breast feed as able. I recommended that she continue to post pump for stimulation and to feed any EBM back to baby.  LC follow up tomorrow recommended. Cheryl Acevedo was excited to see baby's progress at the  breast.  Maternal Data Has patient been taught Hand Expression?: Yes Does the patient have breastfeeding experience prior to this delivery?: Yes  Feeding Feeding Type: Donor Breast Milk Nipple Type: Nfant Slow Flow (purple)  LATCH Score Latch: Grasps breast easily, tongue down, lips flanged, rhythmical sucking.  Audible Swallowing: A few with stimulation  Type of Nipple: Everted at rest and after stimulation  Comfort (Breast/Nipple): Soft / non-tender  Hold (Positioning): Assistance needed to correctly position infant at breast and maintain latch.  LATCH Score: 8  Interventions Interventions: Breast feeding basics reviewed;Assisted with latch;Skin to skin;Breast compression;Adjust position;Support pillows;Expressed milk  Lactation Tools Discussed/Used Tools: Nipple Shields Nipple shield size: 24 Pump Review: Setup, frequency, and cleaning   Consult Status Consult Status: Follow-up Date: 03/29/20 Follow-up type: In-patient    Walker Shadow 03/28/2020, 3:12 PM

## 2020-03-28 NOTE — Discharge Summary (Signed)
Postpartum Discharge Summary  Date of Service updated      Patient Name: Cheryl Acevedo DOB: [birth year] MRN: [id number]  Date of admission: 03/26/2020 Delivery date:03/26/2020  Delivering provider: Janyth Contes  Date of discharge: 03/28/2020  Admitting diagnosis: Labor and delivery indication for care or intervention [O75.9] Intrauterine pregnancy: [redacted]w[redacted]d    Secondary diagnosis:  Principal Problem:   SVD (spontaneous vaginal delivery) Active Problems:   Labor and delivery indication for care or intervention   S/P tubal ligation  Additional problems: none    Discharge diagnosis: Term Pregnancy Delivered                                              Post partum procedures:postpartum tubal ligation Augmentation: AROM and Pitocin Complications: None  Hospital course: Onset of Labor With Vaginal Delivery      29y.o. yo [id number] 324w3das admitted in Latent Labor on 03/26/2020. Patient had an uncomplicated labor course as follows:  Membrane Rupture Time/Date: 8:35 AM ,03/26/2020   Delivery Method:Vaginal, Spontaneous  Episiotomy: None  Lacerations:  None  Patient had an uncomplicated postpartum course.  She is ambulating, tolerating a regular diet, passing flatus, and urinating well. Patient is discharged home in stable condition on 03/28/20.  Newborn Data: Birth date:03/26/2020  Birth time:11:10 AM  Gender:Female  Living status:Living  Apgars:8 ,7  Weight:2910 g   Magnesium Sulfate received: No BMZ received: Yes Rhophylac:N/A MMR:N/A T-DaP:Given prenatally Flu: N/A Transfusion:No  Physical exam  Vitals:   03/27/20 1607 03/27/20 2307 03/28/20 0418 03/28/20 0756  BP: 130/83 109/66 103/62 113/73  Pulse: 89 67 71 68  Resp: '18 18 19 18  ' Temp: 98.3 F (36.8 C) 98.8 F (37.1 C) 98.2 F (36.8 C) 98.3 F (36.8 C)  TempSrc: Oral Oral Oral Oral  SpO2: 99% 98% 99% 100%  Weight:       General: alert, cooperative and no distress Lochia: appropriate Uterine  Fundus: firm Incision: Dressing is clean, dry, and intact DVT Evaluation: No evidence of DVT seen on physical exam. Labs: Lab Results  Component Value Date   WBC 8.3 03/27/2020   HGB 8.2 (L) 03/27/2020   HCT 25.4 (L) 03/27/2020   MCV 81.4 03/27/2020   PLT 228 03/27/2020   CMP Latest Ref Rng & Units 02/24/2018  Glucose 65 - 99 mg/dL 122(H)  BUN 6 - 20 mg/dL 10  Creatinine 0.44 - 1.00 mg/dL 0.68  Sodium 135 - 145 mmol/L 138  Potassium 3.5 - 5.1 mmol/L 3.6  Chloride 101 - 111 mmol/L 107  CO2 22 - 32 mmol/L 23  Calcium 8.9 - 10.3 mg/dL 8.9  Total Protein 6.0 - 8.3 g/dL -  Total Bilirubin 0.3 - 1.2 mg/dL -  Alkaline Phos 39 - 117 U/L -  AST 0 - 37 U/L -  ALT 0 - 35 U/L -   Edinburgh Score: Edinburgh Postnatal Depression Scale Screening Tool 03/28/2020  I have been able to laugh and see the funny side of things. 0  I have looked forward with enjoyment to things. 0  I have blamed myself unnecessarily when things went wrong. 1  I have been anxious or worried for no good reason. 0  I have felt scared or panicky for no good reason. 0  Things have been getting on top of me. 0  I have been so unhappy  that I have had difficulty sleeping. 0  I have felt sad or miserable. 0  I have been so unhappy that I have been crying. 0  The thought of harming myself has occurred to me. 0  Edinburgh Postnatal Depression Scale Total 1      After visit meds:  Allergies as of 03/28/2020   No Known Allergies     Medication List    STOP taking these medications   ondansetron 4 MG tablet Commonly known as: ZOFRAN     TAKE these medications   Abilify Maintena 400 MG Prsy prefilled syringe Generic drug: ARIPiprazole ER Inject 400 mg into the muscle every 28 (twenty-eight) days.   ferrous sulfate 325 (65 FE) MG tablet Take 325 mg by mouth daily with breakfast.   ibuprofen 600 MG tablet Commonly known as: ADVIL Take 1 tablet (600 mg total) by mouth every 6 (six) hours as needed for  cramping.   oxyCODONE-acetaminophen 5-325 MG tablet Commonly known as: Percocet Take 1 tablet by mouth every 8 (eight) hours as needed for up to 5 days for severe pain.   sertraline 100 MG tablet Commonly known as: ZOLOFT Take 50 mg by mouth in the morning and at bedtime.        Discharge home in stable condition Infant Feeding: Bottle and Breast Infant Disposition:rooming in Discharge instruction: per After Visit Summary and Postpartum booklet. Activity: Advance as tolerated. Pelvic rest for 6 weeks.  Diet: routine diet Anticipated Birth Control: BTL done PP Postpartum Appointment:6 weeks Additional Postpartum F/U: n/a Future Appointments:No future appointments. Follow up Visit: Follow-up Information    Bovard-Stuckert, Jody, MD. Schedule an appointment as soon as possible for a visit in 6 week(s).   Specialty: Obstetrics and Gynecology Why: For postpartum visit Contact information: [place] [address] [phone]               03/28/2020 Isaiah Serge, DO

## 2020-03-28 NOTE — Discharge Instructions (Signed)
Call office with any concerns [phone] 

## 2020-03-29 ENCOUNTER — Encounter: Payer: Self-pay | Admitting: *Deleted

## 2020-03-29 ENCOUNTER — Ambulatory Visit: Payer: Self-pay

## 2020-03-29 NOTE — Lactation Note (Signed)
This note was copied from a baby's chart. Lactation Consultation Note  Patient Name: Cheryl Acevedo BWLSL'H Date: 03/29/2020 Reason for consult: Follow-up assessment;1st time breastfeeding;NICU baby;Early term 37-38.6wks  LC in to speak with Mom of ET infant in the NICU.  Baby 71 hrs old.  Mom has made the decision to bottle feed baby formula due to baby "liking it better".  Mom had been having difficulty latching baby to the breast.  Mom aware of LC assistance available if she changes her mind.  Talked about nipple shield use may be helpful.  Talked about continuing to double pump to support her milk supply, and the option of bottle feeding EBM to baby.  Mom seems like she would like to do that.  She last pumped last night.    Referral sent to [facility] for pump on discharge.    Interventions Interventions: Skin to skin;DEBP;Breast massage;Hand express  Lactation Tools Discussed/Used Tools: Pump;Bottle Breast pump type: Double-Electric Breast Pump   Consult Status Consult Status: PRN Date: 03/29/20 Follow-up type: In-patient    Cheryl Acevedo 03/29/2020, 10:19 AM

## 2020-03-31 ENCOUNTER — Ambulatory Visit: Payer: Self-pay

## 2020-03-31 NOTE — Lactation Note (Signed)
This note was copied from a baby's chart. Lactation Consultation Note  Patient Name: Cheryl Acevedo [id number] Date: 03/31/2020   Telephone call from RN that mom wants to be seen by lactation.  Mom reports that now that her milk is in it is easier and she wants to pump. Mom reports she was on the fence about breastfeeding or pumping, but has decided to pump. Mom does not have a pump for home use yet so wants to do a donor pump until she can get one from [facility] for NICU infant. Urged mom to continue to pump 8-12 times day.  Mom in a hurry to leave so did not discuss pump settings or how long to pump.  Urged to call lactation as needed.  Maternal Data    Feeding Feeding Type: Formula  [facility] Score                   Interventions    Lactation Tools Discussed/Used     Consult Status      Maximilliano Kersh Michaelle Copas 03/31/2020, 6:02 PM

## 2020-04-01 ENCOUNTER — Encounter (HOSPITAL_COMMUNITY): Payer: Self-pay | Admitting: Obstetrics and Gynecology

## 2020-04-01 ENCOUNTER — Inpatient Hospital Stay (HOSPITAL_COMMUNITY): Payer: Medicaid Other

## 2020-04-01 ENCOUNTER — Inpatient Hospital Stay (HOSPITAL_COMMUNITY)
Admission: AD | Admit: 2020-04-01 | Discharge: 2020-04-01 | Disposition: A | Discharge status: Home / Self Care | Payer: Medicaid Other | Attending: Obstetrics and Gynecology | Admitting: Obstetrics and Gynecology

## 2020-04-01 ENCOUNTER — Other Ambulatory Visit: Payer: Self-pay

## 2020-04-01 DIAGNOSIS — L02216 Cutaneous abscess of umbilicus: Secondary | ICD-10-CM | POA: Insufficient documentation

## 2020-04-01 DIAGNOSIS — Z791 Long term (current) use of non-steroidal anti-inflammatories (NSAID): Secondary | ICD-10-CM | POA: Insufficient documentation

## 2020-04-01 DIAGNOSIS — F319 Bipolar disorder, unspecified: Secondary | ICD-10-CM | POA: Insufficient documentation

## 2020-04-01 DIAGNOSIS — Z9851 Tubal ligation status: Secondary | ICD-10-CM | POA: Diagnosis not present

## 2020-04-01 DIAGNOSIS — F419 Anxiety disorder, unspecified: Secondary | ICD-10-CM | POA: Diagnosis not present

## 2020-04-01 DIAGNOSIS — Z5189 Encounter for other specified aftercare: Secondary | ICD-10-CM

## 2020-04-01 DIAGNOSIS — T8141XA Infection following a procedure, superficial incisional surgical site, initial encounter: Secondary | ICD-10-CM | POA: Diagnosis present

## 2020-04-01 DIAGNOSIS — Z79899 Other long term (current) drug therapy: Secondary | ICD-10-CM | POA: Insufficient documentation

## 2020-04-01 DIAGNOSIS — T8149XA Infection following a procedure, other surgical site, initial encounter: Secondary | ICD-10-CM

## 2020-04-01 DIAGNOSIS — F429 Obsessive-compulsive disorder, unspecified: Secondary | ICD-10-CM | POA: Diagnosis not present

## 2020-04-01 MED ORDER — CEPHALEXIN 500 MG PO CAPS
500.0000 mg | ORAL_CAPSULE | Freq: Once | ORAL | Status: AC
  Administered 2020-04-01: 500 mg via ORAL
  Filled 2020-04-01: qty 1

## 2020-04-01 MED ORDER — IOHEXOL 300 MG/ML  SOLN
100.0000 mL | Freq: Once | INTRAMUSCULAR | Status: AC | PRN
  Administered 2020-04-01: 100 mL via INTRAVENOUS

## 2020-04-01 NOTE — MAU Note (Signed)
Cheryl Acevedo is a 29 y.o.  here in MAU reporting:  Infection at her incision site Delivered last Monday. Had a tubal. Discharged Wednesday. States today that the incision site began to feel hot and hard. Has noticed green and yellow discharge from the incision.  Pain score: 5-610. With activity. Taking ibuprofen for the pain which she states has been helping. Breastfeeding; baby in NICU Vitals:   04/01/20 1509  BP: 125/83  Pulse: 61  Resp: 16  Temp: (!) 97.5 F (36.4 C)  SpO2: 100%      Lab orders placed from triage: none

## 2020-04-01 NOTE — MAU Provider Note (Signed)
28yo, [id number], s/p SVD and PPBTL on 5-24, presenting for eval of sound.  She called me yesterday, c/o drainage from incision, no F/c, minimal pain or redness.  I saw her in NICU, wound well approximated, minimal erythema, some yellowish clear discharge.  I sent Rx for Keflex to her pharmacy.  She took a dose of Keflex last night, but none today so far.  She cam to MAU today to re-evaluate incision.  Still no F/C, incision still draining.  Afeb, VSS Abs- soft, incision well approximate, mild surrounding erythema, purulent fluid expressed from small opening at left edge of incision.  Culture done and wound probed, fascia intact  CBC    Component Value Date/Time   WBC 8.3 03/27/2020 0504   RBC 3.12 (L) 03/27/2020 0504   HGB 8.2 (L) 03/27/2020 0504   HCT 25.4 (L) 03/27/2020 0504   PLT 228 03/27/2020 0504   MCV 81.4 03/27/2020 0504   MCH 26.3 03/27/2020 0504   MCHC 32.3 03/27/2020 0504   RDW 13.7 03/27/2020 0504   LYMPHSABS 3.3 02/29/2012 1940   MONOABS 0.6 02/29/2012 1940   EOSABS 0.1 02/29/2012 1940   BASOSABS 0.1 02/29/2012 1940   CT scan of abd and pelvis with 18x67mm fuid collection to left of umbilicus, no intra-abdominal process  A/P-Postop wound cellulitis which is draining, no abscess.  Wound probed and cultured, fluid expressed.  Will continue on Keflex and discharge home

## 2020-04-01 NOTE — MAU Provider Note (Signed)
History     CSN: [id number]  Arrival date and time: 04/01/20 1436   First Provider Initiated Contact with Patient 04/01/20 1525      Chief Complaint  Patient presents with  . Wound Check   Ms. Cheryl Acevedo is a 29 y.o. [phone] at Unknown who presents to MAU for surgical site infection. Pt had NSVD on 03/26/2020 followed by BTL same day. Patient denies any complications with surgery or delivery and notes in Epic concur. Patient reports she noticed this yesterday because she saw a yellow fluid coming through the dressing and when she took the dressing off saw a yellow-green discharge that does not have an odor. Patient denies bleeding, but reports she feels like her stomach is more swollen than after she delivered. Patient reports an increase in drainage since yesterday and reports the drainage will run down her stomach at times. Patient reports Dr. Willis Modena came and looked at it yesterday in NICU and prescribed patient antibiotics (patient unsure of name), which she is taking, but forgot to take today. Patient reports it is only tender to touch and when walking. Patient is here today because she wants to know if the fluid needs to be drained, but Dr. Willis Modena did not say anything about that to her yesterday. Patient reports her dad recently passed away from MRSA, so she is concerned about this infection.  Allergies? NKDA Current medications/supplements? ABX, sertraline, Abilify Pregnant/postpartum/breastfeeding? Pumping/breastfeeding Prenatal care provider? [facility], next appt 2 weeks for incision check   OB History    Gravida  3   Para  2   Term  2   Preterm      AB  1   Living  2     SAB      TAB  1   Ectopic      Multiple  0   Live Births  2           Past Medical History:  Diagnosis Date  . ACL (anterior cruciate ligament) tear    left  . Anxiety   . Bipolar disorder ([place])   . Depression   . Obsessive compulsive disorder   . Paranoia ([place])     . S/P tubal ligation 03/26/2020  . SVD (spontaneous vaginal delivery) 03/26/2020    Past Surgical History:  Procedure Laterality Date  . COSMETIC SURGERY    . TUBAL LIGATION N/A 03/26/2020   Procedure: POST PARTUM TUBAL LIGATION;  Surgeon: Janyth Contes, MD;  Location: [facility] LD ORS;  Service: Gynecology;  Laterality: N/A;  . WISDOM TOOTH EXTRACTION Bilateral 2000   x4    Family History  Problem Relation Age of Onset  . Cancer Mother   . Diabetes Other   . Cancer Other     Social History   Tobacco Use  . Smoking status: Never Smoker  . Smokeless tobacco: Never Used  Substance Use Topics  . Alcohol use: No    Comment: Occasional  . Drug use: No    Allergies: No Known Allergies  Medications Prior to Admission  Medication Sig Dispense Refill Last Dose  . ARIPiprazole ER (ABILIFY MAINTENA) 400 MG PRSY prefilled syringe Inject 400 mg into the muscle every 28 (twenty-eight) days.      . ferrous sulfate 325 (65 FE) MG tablet Take 325 mg by mouth daily with breakfast.     . ibuprofen (ADVIL) 600 MG tablet Take 1 tablet (600 mg total) by mouth every 6 (six) hours as needed for cramping. [place]  tablet 1   . oxyCODONE-acetaminophen (PERCOCET) 5-325 MG tablet Take 1 tablet by mouth every 8 (eight) hours as needed for up to 5 days for severe pain. 15 tablet 0   . sertraline (ZOLOFT) 100 MG tablet Take 50 mg by mouth in the morning and at bedtime.        Review of Systems  Constitutional: Negative for chills, diaphoresis, fatigue and fever.  Eyes: Negative for visual disturbance.  Respiratory: Negative for shortness of breath.   Cardiovascular: Negative for chest pain.  Gastrointestinal: Negative for abdominal pain, constipation, diarrhea, nausea and vomiting.  Genitourinary: Negative for dysuria, flank pain, frequency, pelvic pain, urgency, vaginal bleeding and vaginal discharge.  Neurological: Negative for dizziness, weakness, light-headedness and headaches.   Physical Exam    Blood pressure 125/83, pulse 61, temperature (!) 97.5 F (36.4 C), temperature source Oral, resp. rate 16, weight 86.6 kg, SpO2 100 %, unknown if currently breastfeeding.  Patient Vitals for the past 24 hrs:  BP Temp Temp src Pulse Resp SpO2 Weight  04/01/20 1509 125/83 (!) 97.5 F (36.4 C) Oral 61 16 100 % 86.6 kg   Physical Exam  Constitutional: She is oriented to person, place, and time. She appears well-developed and well-nourished. No distress.  HENT:  Head: Normocephalic and atraumatic.  Respiratory: Effort normal.  Neurological: She is alert and oriented to person, place, and time.  Skin: Skin is warm and dry. She is not diaphoretic.     Psychiatric: She has a normal mood and affect. Her behavior is normal. Judgment and thought content normal.   No results found for this or any previous visit (from the past 24 hour(s)).  MAU Course  Procedures  MDM -area of infection with possible abscess at umbilical incision -pt given ABX yesterday but has not been taking regularly -Dr. Vergie Living consulted and came to bedside to evaluate patient, states patient needs I&D and to call private provider to see if they would like to perform I&D or have [facility] perform -called Dr. Jackelyn Knife who states he will take over care of patient and would like to order imaging prior to performing I&D -pt notified and patient care transferred to Dr. Jackelyn Knife  Assessment and Plan   1. Wound check, abscess   2. Postpartum state    -care transferred to Dr. [name] 04/01/2020, 4:18 PM

## 2020-04-03 ENCOUNTER — Ambulatory Visit: Payer: Self-pay

## 2020-04-03 NOTE — Lactation Note (Signed)
This note was copied from a baby's chart. Lactation Consultation Note  Patient Name: Cheryl Acevedo [id number] Date: 04/03/2020 Reason for consult: Follow-up assessment;Difficult latch;1st time breastfeeding;NICU baby;Early term 73-38.6wks   8 day old Early Term infant.  LC in to talk with Mom about her pumping.  Mom's milk supply has decreased as she isn't pumping as frequently.  Mom states she will start pumping more.    Baby was fussy in his crib when Mom was planning to leave.  Mom agreed to hold baby, and he quieted for a bit.  She then offered to try to latch him.  Pillows added for support.  Baby fussy and unable to latch for more than a couple sucks. Tried with a nipple shield, but baby wouldn't latch.  Noticed that when baby cried, his tongue was U shaped.  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  Mom asked a lot of questions about this.  Reassured Mom that many babies have short lingual frenula, and Pediatricians, oral surgeons, pediatric dentists can laser or clip these to release the tongue.    Mom is also getting a monthly IM Abilify Maintena which is an L3 category medication.  It can cause irritability and feeding difficulty in the newborn.  It also can lead to lower prolactin levels in Mom, thus Mom's lower milk supply.  Shared this with RN.  Mom getting her monthly IM injection tomorrow on 6/2.     Feeding Feeding Type: Breast Fed Nipple Type: Dr. Irving Burton level 4  LATCH Score Latch: Repeated attempts needed to sustain latch, nipple held in mouth throughout feeding, stimulation needed to elicit sucking reflex.  Audible Swallowing: None  Type of Nipple: Everted at rest and after stimulation  Comfort (Breast/Nipple): Soft / non-tender  Hold (Positioning): Full assist, staff holds infant at breast  LATCH Score: 5  Interventions Interventions: Breast feeding basics reviewed;Assisted with latch;Skin to skin;Breast massage;Hand express;Breast  compression;Adjust position;Support pillows;Position options;DEBP  Lactation Tools Discussed/Used Tools: Pump;Nipple Shields Nipple shield size: 20 Breast pump type: Double-Electric Breast Pump   Consult Status Consult Status: Follow-up Date: 04/05/20 Follow-up type: In-patient    [name] 04/03/2020, 7:30 PM

## 2020-04-06 ENCOUNTER — Ambulatory Visit: Payer: Self-pay

## 2020-04-06 LAB — AEROBIC/ANAEROBIC CULTURE W GRAM STAIN (SURGICAL/DEEP WOUND)

## 2020-04-06 NOTE — Lactation Note (Signed)
This note was copied from a baby's chart. Lactation Consultation Note  Patient Name: Cheryl Acevedo Today's Date: 04/06/2020   Lactation rounded today on this room; [name] was not in. RN assigned to room informed me that [name] may no longer be pumping or breast feeding due to current medication, which is an L3 and increase in baby's fussiness with feeds. I asked RN to contact me if [name] visits today just to clarify her plan and offer assistance, as needed.   Feeding Feeding Type: Formula Nipple Type: Other(Avent Variant level 1)    Walker Shadow 04/06/2020, 1:13 PM

## 2020-04-16 ENCOUNTER — Ambulatory Visit: Payer: Self-pay

## 2020-04-16 NOTE — Lactation Note (Signed)
This note was copied from a baby's chart. Lactation Consultation Note  Patient Name: Cheryl Acevedo Today's Date: 04/16/2020    Follow up to see mother and mother was not in room.   LC spoke with Cheryl Acevedo and he reported that Cheryl Acevedo reported to him  that she is no longer pumping because her milk dried up.       Maternal Data    Feeding Feeding Type: Formula Nipple Type: Other (Avent level 1 variant)  LATCH Score                   Interventions    Lactation Tools Discussed/Used     Consult Status      Cheryl Acevedo 04/16/2020, 11:59 AM

## 2020-05-03 ENCOUNTER — Encounter (HOSPITAL_COMMUNITY): Payer: Self-pay | Admitting: Obstetrics and Gynecology

## 2020-05-03 ENCOUNTER — Inpatient Hospital Stay (HOSPITAL_COMMUNITY)
Admission: AD | Admit: 2020-05-03 | Discharge: 2020-05-03 | Disposition: A | Discharge status: Home / Self Care | Payer: Medicaid Other | Attending: Obstetrics and Gynecology | Admitting: Obstetrics and Gynecology

## 2020-05-03 ENCOUNTER — Other Ambulatory Visit: Payer: Self-pay

## 2020-05-03 DIAGNOSIS — Z79899 Other long term (current) drug therapy: Secondary | ICD-10-CM | POA: Insufficient documentation

## 2020-05-03 DIAGNOSIS — Z791 Long term (current) use of non-steroidal anti-inflammatories (NSAID): Secondary | ICD-10-CM | POA: Insufficient documentation

## 2020-05-03 DIAGNOSIS — F429 Obsessive-compulsive disorder, unspecified: Secondary | ICD-10-CM | POA: Insufficient documentation

## 2020-05-03 DIAGNOSIS — F319 Bipolar disorder, unspecified: Secondary | ICD-10-CM | POA: Insufficient documentation

## 2020-05-03 DIAGNOSIS — Z5189 Encounter for other specified aftercare: Secondary | ICD-10-CM | POA: Diagnosis not present

## 2020-05-03 DIAGNOSIS — R000 Tachycardia, unspecified: Secondary | ICD-10-CM

## 2020-05-03 DIAGNOSIS — R42 Dizziness and giddiness: Secondary | ICD-10-CM

## 2020-05-03 DIAGNOSIS — F419 Anxiety disorder, unspecified: Secondary | ICD-10-CM | POA: Diagnosis not present

## 2020-05-03 DIAGNOSIS — R531 Weakness: Secondary | ICD-10-CM

## 2020-05-03 LAB — CBC
HCT: 32.6 % — ABNORMAL LOW (ref 36.0–46.0)
Hemoglobin: 10.2 g/dL — ABNORMAL LOW (ref 12.0–15.0)
MCH: 25.2 pg — ABNORMAL LOW (ref 26.0–34.0)
MCHC: 31.3 g/dL (ref 30.0–36.0)
MCV: 80.7 fL (ref 80.0–100.0)
Platelets: 320 10*3/uL (ref 150–400)
RBC: 4.04 MIL/uL (ref 3.87–5.11)
RDW: 15 % (ref 11.5–15.5)
WBC: 7.1 10*3/uL (ref 4.0–10.5)
nRBC: 0 % (ref 0.0–0.2)

## 2020-05-03 NOTE — Discharge Instructions (Signed)
Laparoscopic Tubal Ligation, Care After °This sheet gives you information about how to care for yourself after your procedure. Your health care provider may also give you more specific instructions. If you have problems or questions, contact your health care provider. °What can I expect after the procedure? °After the procedure, it is common to have: °· A sore throat. °· Discomfort in your shoulder. °· Mild discomfort or cramping in your abdomen. °· Gas pains. °· Pain or soreness in the area where the surgical incision was made. °· A bloated feeling. °· Tiredness. °· Nausea. °· Vomiting. °Follow these instructions at home: °Medicines °· Take over-the-counter and prescription medicines only as told by your health care provider. °· Do not take aspirin because it can cause bleeding. °· Ask your health care provider if the medicine prescribed to you: °? Requires you to avoid driving or using heavy machinery. °? Can cause constipation. You may need to take actions to prevent or treat constipation, such as: °§ Drink enough fluid to keep your urine pale yellow. °§ Take over-the-counter or prescription medicines. °§ Eat foods that are high in fiber, such as beans, whole grains, and fresh fruits and vegetables. °§ Limit foods that are high in fat and processed sugars, such as fried or sweet foods. °Incision care ° °  ° °· Follow instructions from your health care provider about how to take care of your incision. Make sure you: °? Wash your hands with soap and water before and after you change your bandage (dressing). If soap and water are not available, use hand sanitizer. °? Change your dressing as told by your health care provider. °? Leave stitches (sutures), skin glue, or adhesive strips in place. These skin closures may need to stay in place for 2 weeks or longer. If adhesive strip edges start to loosen and curl up, you may trim the loose edges. Do not remove adhesive strips completely unless your health care provider  tells you to do that. °· Check your incision area every day for signs of infection. Check for: °? Redness, swelling, or pain. °? Fluid or blood. °? Warmth. °? Pus or a bad smell. °Activity °· Rest as told by your health care provider. °· Avoid sitting for a long time without moving. Get up to take short walks every 1-2 hours. This is important to improve blood flow and breathing. Ask for help if you feel weak or unsteady. °· Return to your normal activities as told by your health care provider. Ask your health care provider what activities are safe for you. °General instructions °· Do not take baths, swim, or use a hot tub until your health care provider approves. Ask your health care provider if you may take showers. You may only be allowed to take sponge baths. °· Have someone help you with your daily household tasks for the first few days. °· Keep all follow-up visits as told by your health care provider. This is important. °Contact a health care provider if: °· You have redness, swelling, or pain around your incision. °· Your incision feels warm to the touch. °· You have pus or a bad smell coming from your incision. °· The edges of your incision break open after the sutures have been removed. °· Your pain does not improve after 2-3 days. °· You have a rash. °· You repeatedly become dizzy or light-headed. °· Your pain medicine is not helping. °Get help right away if you: °· Have a fever. °· Faint. °· Have increasing   pain in your abdomen.  Have severe pain in one or both of your shoulders.  Have fluid or blood coming from your sutures or from your vagina.  Have shortness of breath or difficulty breathing.  Have chest pain or leg pain.  Have ongoing nausea, vomiting, or diarrhea. Summary  After the procedure, it is common to have mild discomfort or cramping in your abdomen.  Take over-the-counter and prescription medicines only as told by your health care provider.  Watch for symptoms that should  prompt you to call your health care provider.  Keep all follow-up visits as told by your health care provider. This is important. This information is not intended to replace advice given to you by your health care provider. Make sure you discuss any questions you have with your health care provider. Document Revised: 03/29/2019 Document Reviewed: 09/14/2018 Elsevier Patient Education  2020 [employer].      MRSA Infection, Self-Care, Adult Methicillin-resistant Staphylococcus aureus (MRSA) infection is caused by bacteria that no longer respond to antibiotic medicines. MRSA infection can be hard to treat. Self-care is important after being diagnosed with MRSA. Following instructions for self-care will:  Help you heal well.  Prevent the infection from spreading to others. Your health care provider may also give you more specific instructions. If you have problems or questions, contact your health care provider. What are the risks? MRSA can be on the skin or in the nose of many people without causing problems. This is called MRSA colonization. However, MRSA can cause problems for you and others if it enters the body through a cut, a sore, or an invasive medical procedure or device.  If MRSA enters your body, it may cause serious problems, such as: ? Skin infections. ? Bone or joint infections. ? Pneumonia. ? Bloodstream infections (sepsis).  If you have these infections, MRSA may spread to others, including: ? Visitors or other patients in the hospital. ? Friends, family, or other people at home or in your community. Supplies needed:  Soap and water.  Germ-free (sterile) dressing.  Alcohol-based hand sanitizer (optional).  Home cleaning solutions that contain bleach.  Clean or disposable towels. How to prevent MRSA infections Take these steps to avoid getting another MRSA infection and to prevent the bacteria from spreading to others. Hand washing   Wash your hands  frequently with soap and water. If soap and water are not available, use an alcohol-based hand sanitizer. Dry your hands with a clean or disposable towel.  Make sure that everyone in the household washes their hands often. Wound care  If you have a wound, follow instructions from your health care provider about how to take care of your wound. Make sure you:  Wash your hands with soap and water before and after you change your bandage (dressing). If soap and water are not available, use an alcohol-based hand sanitizer.  Change your dressing as told by your health care provider.  Leave any stitches (sutures), skin glue, or adhesive strips in place. These skin closures may need to be in place for 2 weeks or longer. If adhesive strip edges start to loosen and curl up, you may trim the loose edges. Do not remove adhesive strips completely unless your health care provider tells you to do that.  Clean wounds, cuts, and abrasions with soap and water and cover them with dry, sterile dressings until they heal.  Check your wound every day for signs of infection. Check for: ? More redness, swelling, or pain. ?  More fluid or blood. ? Warmth. ? Pus or a bad smell.  If you have a wound that seems to be infected, ask your health care provider if a culture should be done for MRSA and other bacteria. Personal hygiene  Maintain good hygiene by bathing often and keeping your body clean.  Wash hands before preparing food.  Always shower after exercising.  Wash towels, bedding, and clothes in the washing machine with detergent and hot water. Dry them in a hot dryer. General tips  Take your antibiotic medicine as told by your health care provider. Take them only when absolutely necessary. Do not stop taking the antibiotic even if you start to feel better.  Avoid close contact with others as much as possible.  Do not use towels, razors, toothbrushes, bedding, or other items that will be used by  others.  Clean surfaces regularly to remove germs (disinfection). Use products or solutions that contain bleach. Make sure you disinfect bathroom surfaces, food preparation areas, and doorknobs. Follow these instructions at home:  Take over-the-counter and prescription medicines only as told by your health care provider.  Tell all health care providers who care for you that you have MRSA or that you have had MRSA.  If you are breastfeeding, talk to your health care provider about MRSA. You may be asked to temporarily stop breastfeeding.  If you have an invasive medical device, make sure that you know how to take care of it to prevent infection.  Keep all follow-up visits as told by your health care provider. This is important. Contact a health care provider if:  Your infection seems to be getting worse. Signs may include: ? Warmth, redness, or tenderness around your wound site. ? A red line that spreads from your infection site. ? A dark color in the area around your infection. ? Wound drainage that is tan, yellow, or green. ? A bad smell coming from your wound.  You have symptoms of a new MRSA infection: ? A pus-filled pimple. ? A boil on your skin. ? Pus draining from your skin. ? A sore (abscess) under your skin or somewhere in your body. ? Fever with or without chills. Get help right away if:  You have trouble breathing.  You feel nauseous, you vomit, or you cannot take medicine without vomiting.  You have chest pain. These symptoms may represent a serious problem that is an emergency. Do not wait to see if the symptoms will go away. Get medical help right away. Call your local emergency services (911 in the [place] Do not drive yourself to the hospital. Summary  Self-care is important after being diagnosed with MRSA. This will help you heal well and prevent the infection from spreading to others.  Wash your hands often, avoid close contact with others, and avoid sharing  towels, razors, toothbrushes, and bedding with other people. This will help prevent the infection from spreading to others.  If you are being treated for a MRSA infection, make sure to take over-the-counter and prescription medicines as told. Finish all antibiotic medicine even if you start to feel better.  If you have a wound, follow instructions from your health care provider about how to take care of it. Check your wound every day for signs of infection. This information is not intended to replace advice given to you by your health care provider. Make sure you discuss any questions you have with your health care provider. Document Revised: 01/06/2019 Document Reviewed: 01/07/2019 Elsevier Patient Education  2020 [employer]      MRSA Infection, Diagnosis, Adult Methicillin-resistant Staphylococcus aureus (MRSA) infection is caused by bacteria called Staphylococcus aureus, or staph, that no longer respond to common antibiotic medicines (drug-resistant bacteria). MRSA infection can be hard to treat. Most of the time, MRSA can be on the skin or in the nose without causing problems (colonized). However, if MRSA enters the body through a cut, a sore, or an invasive medical device, it can cause a serious infection. What are the causes? This condition is caused by staph bacteria. Illness may develop after exposure to the bacteria through:  Skin-to-skin contact with someone who is infected with MRSA.  Touching surfaces that have the bacteria on them.  Having a procedure or using equipment that allows MRSA to enter the body.  Having MRSA that lives on your skin and then enters your body through: ? A cut or scratch. ? A surgery or procedure. ? The use of a medical device. Contact with the bacteria may occur:  During a stay in a hospital, rehabilitation facility, nursing home, or other health care facility (health care-associated MRSA).  In daily activities where there is close contact  with others, such as sports, child care centers, or at home (community-associated MRSA). What increases the risk? You are more likely to develop this condition if you:  Have a surgery or procedure.  Have an IV or a thin tube (catheter) placed in your body.  Are elderly.  Are on kidney dialysis.  Have recently taken an antibiotic medicine.  Live in a long-term care facility.  Have a chronic wound or skin ulcer.  Have a weak body defense system (immune system).  Play sports that involve skin-to-skin contact.  Live in a crowded place, like a dormitory or [employer].  Share towels, razors, or sports equipment with other people.  Have a history of MRSA infection or colonization. What are the signs or symptoms? Symptoms of this condition depend on the area that is affected. Symptoms may include:  A pus-filled pimple or boil.  Pus that drains from your skin.  A sore (abscess) under your skin or somewhere in your body.  Fever with or without chills.  Difficulty breathing.  Coughing up blood.  Redness, warmth, swelling, or pain in the affected area. How is this diagnosed? This condition may be diagnosed based on:  A physical exam.  Your medical history.  Taking a sample from the infected area and growing it in a lab (culture). You may also have other tests, including:  Imaging tests, such as X-rays, a CT scan, or an MRI.  Lab tests, such as blood, urine, or phlegm (sputum) tests. You skin or nose may be swabbed when you are admitted to a health care facility for a procedure. This is to screen for MRSA. How is this treated? Treatment depends on the type of MRSA infection you have and how severe, deep, or extensive it is. Treatment may include:  Antibiotic medicines.  Surgery to drain pus from the infected area. Severe infections may require a hospital stay. Follow these instructions at home: Medicines  Take over-the-counter and prescription medicines  only as told by your health care provider.  If you were prescribed an antibiotic medicine, use it as told by your health care provider. Do not stop using the antibiotic even if you start to feel better. Prevention Follow these instructions to avoid spreading the infection to others:  Wash your hands frequently with soap and water. If soap and  water are not available, use an alcohol-based hand sanitizer.  Avoid close contact with those around you as much as possible. Do not use towels, razors, toothbrushes, bedding, or other items that will be used by others.  Wash towels, bedding, and clothes in the washing machine with detergent and hot water. Dry them in a hot dryer.  Clean surfaces regularly to remove germs (disinfection). Use products or solutions that contain bleach. Make sure you disinfect bathroom surfaces, food preparation areas, exercise equipment, and doorknobs.  General instructions  If you have a wound, follow instructions from your health care provider about how to take care of your wound. ? Do not pick at scabs. ? Do not try to drain any infection sites or pimples.  Tell all your health care providers that you have MRSA, or if you have ever had a MRSA infection.  Keep all follow-up visits as told by your health care provider. This is important. Contact a health care provider if you:  Do not get better.  Have symptoms that get worse.  Have new symptoms. Get help right away if you have:  Nausea or vomiting, or if you cannot take medicine without vomiting.  Trouble breathing.  Chest pain. These symptoms may represent a serious problem that is an emergency. Do not wait to see if the symptoms will go away. Get medical help right away. Call your local emergency services (911 in the [place] Do not drive yourself to the hospital. Summary  MRSA infection is caused by bacteria called Staphylococcus aureus, or staph, that no longer respond to common antibiotic  medicines.  Treatment for this condition depends on the type of MRSA infection you have and how severe, deep, and extensive it is.  If you were prescribed an antibiotic medicine, use it as told by your health care provider. Do not stop using the antibiotic even if you start to feel better.  Follow instructions from your health care provider to avoid spreading the infection to others. This information is not intended to replace advice given to you by your health care provider. Make sure you discuss any questions you have with your health care provider. Document Revised: 01/06/2019 Document Reviewed: 01/07/2019 Elsevier Patient Education  2020 [employer]

## 2020-05-03 NOTE — MAU Provider Note (Signed)
History     CSN: [id number]  Arrival date and time: 05/03/20 1155   First Provider Initiated Contact with Patient 05/03/20 1244      Chief Complaint  Patient presents with  . Post-op Problem   Cheryl Acevedo is a 29 y.o. [phone] at Unknown who presents to MAU for possible wound infection. Patient was seen in MAU on 04/01/2020, 4 days after discharge from hospital for wound infection. Patient reports a culture of her wound fluid was performed at that time and tested positive for MRSA. Patient reports she was given Keflex at first, but then once the culture was positive for MRSA her antibiotics were changed, patient is unable to recall the name of the second antibiotic, but reports taking it for 7-10days. Patient reports she had one visit in her OBs office for follow-up and reports the infection looked a lot better, but reports she missed her second appointment as she overslept and did not reschedule her second appointment. Patient reports she called her OBs office because she feels really dizzy and weak and her OB told her to come to MAU to be evaluated. Patient reports that her surgical site is "oozing" but this was not what made her come in - she was concerned about how she felt and her dad recently died from a MRSA infection.  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 Patient denies syncopal episode and states "I don't know if it's because I have been outside a lot." Patient also reports "upset belly." Patient also reports she feels like her heart rate gets really fast, but that only happens when she is outside. Patient also reports she "feels like she needs to lay off the caffeine." Patient reports these same  symptoms happened last summer when she had to wear a heart rate monitor and they did find episodes of tachycardia after wearing the monitor. Patient does not report any symptoms present while currently in MAU. Patient also states she did not follow-up with cardiology after this diagnosis was made, but will call to schedule a follow-up visit. Patient also reports stress due to newborn.  Pt denies VB, vaginal discharge/odor/itching. Pt denies N/V, abdominal pain, constipation, diarrhea, or urinary problems. Pt denies fever, chills, fatigue, sweating or changes in appetite. Pt denies SOB or chest pain. Pt denies dizziness, HA, light-headedness, weakness.  Allergies? NKDA Current medications/supplements? Zoloft 150mg , Abilify, Vyvanse PRN Pregnant/postpartum/breastfeeding? Not breastfeeding Prenatal care provider? [facility], next appt 05/10/2020   OB History    Gravida  3   Para  2   Term  2   Preterm      AB  1   Living  2     SAB      TAB  1   Ectopic      Multiple  0   Live Births  2           Past Medical History:  Diagnosis Date  . ACL (anterior cruciate ligament) tear    left  . Anxiety   . Bipolar disorder (HCC)   . Depression   . Obsessive compulsive disorder   . Paranoia (HCC)   . S/P tubal ligation 03/26/2020  . SVD (  spontaneous vaginal delivery) 03/26/2020    Past Surgical History:  Procedure Laterality Date  . COSMETIC SURGERY    . TUBAL LIGATION N/A 03/26/2020   Procedure: POST PARTUM TUBAL LIGATION;  Surgeon: Sherian Rein, MD;  Location: [facility] LD ORS;  Service: Gynecology;  Laterality: N/A;  . WISDOM TOOTH EXTRACTION Bilateral 2000   x4    Family History  Problem Relation Age of Onset  . Cancer Mother   . Diabetes Other   . Cancer Other     Social History   Tobacco Use  . Smoking status: Never Smoker  . Smokeless tobacco: Never Used  Vaping Use  . Vaping Use: Never used  Substance Use Topics  . Alcohol use: No     Comment: Occasional  . Drug use: No    Allergies: No Known Allergies  Medications Prior to Admission  Medication Sig Dispense Refill Last Dose  . lisdexamfetamine (VYVANSE) 40 MG capsule Take 40 mg by mouth every morning.   04/29/2020  . sertraline (ZOLOFT) 100 MG tablet Take 150 mg by mouth daily.    05/02/2020 at 2030  . ARIPiprazole ER (ABILIFY MAINTENA) 400 MG PRSY prefilled syringe Inject 400 mg into the muscle every 28 (twenty-eight) days.    04/09/2020  . ferrous sulfate 325 (65 FE) MG tablet Take 325 mg by mouth daily with breakfast.     . ibuprofen (ADVIL) 600 MG tablet Take 1 tablet (600 mg total) by mouth every 6 (six) hours as needed for cramping. 40 tablet 1     Review of Systems  Constitutional: Negative for chills, diaphoresis, fatigue and fever.  Eyes: Positive for visual disturbance.  Respiratory: Negative for shortness of breath.   Cardiovascular: Negative for chest pain.       Tacycardia  Gastrointestinal: Negative for abdominal pain, constipation, diarrhea, nausea and vomiting.  Genitourinary: Negative for dysuria, flank pain, frequency, pelvic pain, urgency, vaginal bleeding and vaginal discharge.  Neurological: Positive for dizziness and weakness. Negative for light-headedness and headaches.   Physical Exam   Blood pressure 119/71, pulse 74, temperature 98.2 F (36.8 C), temperature source Oral, resp. rate 18, last menstrual period 04/24/2020, SpO2 100 %, not currently breastfeeding.  Patient Vitals for the past 24 hrs:  BP Temp Temp src Pulse Resp SpO2  05/03/20 1231 119/71 98.2 F (36.8 C) Oral 74 18 100 %  05/03/20 1209 116/77 98.2 F (36.8 C) -- 87 16 100 %   Physical Exam Vitals and nursing note reviewed.  Constitutional:      General: She is not in acute distress.    Appearance: She is well-developed. She is not diaphoretic.  HENT:     Head: Normocephalic and atraumatic.  Pulmonary:     Effort: Pulmonary effort is normal.  Abdominal:     General:  There is no distension.     Palpations: Abdomen is soft. There is no mass.     Tenderness: There is no abdominal tenderness. There is no guarding or rebound.    Skin:    General: Skin is warm and dry.  Neurological:     Mental Status: She is alert and oriented to person, place, and time.  Psychiatric:        Behavior: Behavior normal.        Thought Content: Thought content normal.        Judgment: Judgment normal.    Results for orders placed or performed during the hospital encounter of 05/03/20 (from the past 24 hour(s))  CBC  Status: Abnormal   Collection Time: 05/03/20  1:26 PM  Result Value Ref Range   WBC 7.1 4.0 - 10.5 K/uL   RBC 4.04 3.87 - 5.11 MIL/uL   Hemoglobin 10.2 (L) 12.0 - 15.0 g/dL   HCT 26.8 (L) 36 - 46 %   MCV 80.7 80.0 - 100.0 fL   MCH 25.2 (L) 26.0 - 34.0 pg   MCHC 31.3 30.0 - 36.0 g/dL   RDW 34.1 96.2 - 22.9 %   Platelets 320 150 - 400 K/uL   nRBC 0.0 0.0 - 0.2 %    MAU Course  Procedures  MDM -ppartum wound check - well-healing, no s/sx of infection or abcess -no symptoms of dizziness, weakness, tachycardia while in MAU -hgb increased 2 points since last CBC one month ago -pt discharged to home in stable condition  Orders Placed This Encounter  Procedures  . CBC    Standing Status:   Standing    Number of Occurrences:   1  . Contact ([place]) Isolation    Standing Status:   Standing    Number of Occurrences:   1  . Discharge patient    Order Specific Question:   Discharge disposition    Answer:   01-Home or Self Care [1]    Order Specific Question:   Discharge patient date    Answer:   05/03/2020    Assessment and Plan   1. Encounter for wound re-check   2. Postpartum state   3. Dizziness   4. Weakness   5. Tachycardia     Allergies as of 05/03/2020   No Known Allergies     Medication List    TAKE these medications   Abilify Maintena 400 MG Prsy prefilled syringe Generic drug: ARIPiprazole ER Inject 400 mg into the muscle  every 28 (twenty-eight) days.   ferrous sulfate 325 (65 FE) MG tablet Take 325 mg by mouth daily with breakfast.   ibuprofen 600 MG tablet Commonly known as: ADVIL Take 1 tablet (600 mg total) by mouth every 6 (six) hours as needed for cramping.   sertraline 100 MG tablet Commonly known as: ZOLOFT Take 150 mg by mouth daily.   Vyvanse 40 MG capsule Generic drug: lisdexamfetamine Take 40 mg by mouth every morning.      -encouraged to increase hydration, lower caffeine consumption, frequent rest periods during heat -return MAU vs. ED precautions given -pt to consult cardiology for f/u -pt discharged to home in stable con  Cheryl Acevedo 05/03/2020, 2:30 PM

## 2020-05-03 NOTE — MAU Note (Signed)
Her incision(from tubal) is infected again.  Started like a boil then it 'busted out 4 days ago, still draining'.  Last time it cultured MRSA and it looks worse this time.  Office told her to come up and get checked, may need IV. Has been feeling tired and dizzy last few days.

## 2021-08-06 ENCOUNTER — Observation Stay (HOSPITAL_COMMUNITY): Payer: Medicare Other

## 2021-08-06 ENCOUNTER — Inpatient Hospital Stay (HOSPITAL_COMMUNITY)
Admission: EM | Admit: 2021-08-06 | Discharge: 2021-08-08 | DRG: 880 | Disposition: A | Discharge status: Home / Self Care | Payer: Medicare Other | Attending: Family Medicine | Admitting: Family Medicine

## 2021-08-06 ENCOUNTER — Other Ambulatory Visit: Payer: Self-pay

## 2021-08-06 ENCOUNTER — Emergency Department (HOSPITAL_COMMUNITY): Payer: Medicare Other

## 2021-08-06 ENCOUNTER — Encounter (HOSPITAL_COMMUNITY): Payer: Self-pay | Admitting: Emergency Medicine

## 2021-08-06 DIAGNOSIS — I452 Bifascicular block: Secondary | ICD-10-CM | POA: Diagnosis present

## 2021-08-06 DIAGNOSIS — E876 Hypokalemia: Secondary | ICD-10-CM | POA: Diagnosis present

## 2021-08-06 DIAGNOSIS — F339 Major depressive disorder, recurrent, unspecified: Secondary | ICD-10-CM | POA: Diagnosis present

## 2021-08-06 DIAGNOSIS — G47411 Narcolepsy with cataplexy: Secondary | ICD-10-CM | POA: Diagnosis present

## 2021-08-06 DIAGNOSIS — R4182 Altered mental status, unspecified: Secondary | ICD-10-CM | POA: Diagnosis not present

## 2021-08-06 DIAGNOSIS — R55 Syncope and collapse: Secondary | ICD-10-CM

## 2021-08-06 DIAGNOSIS — F410 Panic disorder [episodic paroxysmal anxiety] without agoraphobia: Secondary | ICD-10-CM | POA: Diagnosis present

## 2021-08-06 DIAGNOSIS — F429 Obsessive-compulsive disorder, unspecified: Secondary | ICD-10-CM | POA: Diagnosis present

## 2021-08-06 DIAGNOSIS — Z79899 Other long term (current) drug therapy: Secondary | ICD-10-CM

## 2021-08-06 DIAGNOSIS — F445 Conversion disorder with seizures or convulsions: Principal | ICD-10-CM | POA: Diagnosis present

## 2021-08-06 DIAGNOSIS — Z20822 Contact with and (suspected) exposure to covid-19: Secondary | ICD-10-CM | POA: Diagnosis present

## 2021-08-06 DIAGNOSIS — R569 Unspecified convulsions: Secondary | ICD-10-CM

## 2021-08-06 DIAGNOSIS — Z833 Family history of diabetes mellitus: Secondary | ICD-10-CM

## 2021-08-06 DIAGNOSIS — F259 Schizoaffective disorder, unspecified: Secondary | ICD-10-CM | POA: Diagnosis present

## 2021-08-06 LAB — COMPREHENSIVE METABOLIC PANEL
ALT: 16 U/L (ref 0–44)
AST: 20 U/L (ref 15–41)
Albumin: 4.5 g/dL (ref 3.5–5.0)
Alkaline Phosphatase: 65 U/L (ref 38–126)
Anion gap: 9 (ref 5–15)
BUN: 13 mg/dL (ref 6–20)
CO2: 27 mmol/L (ref 22–32)
Calcium: 9.3 mg/dL (ref 8.9–10.3)
Chloride: 106 mmol/L (ref 98–111)
Creatinine, Ser: 0.75 mg/dL (ref 0.44–1.00)
GFR, Estimated: >60 mL/min (ref >60)
Glucose, Bld: 115 mg/dL — ABNORMAL HIGH (ref 70–99)
Potassium: 3.3 mmol/L — ABNORMAL LOW (ref 3.5–5.1)
Sodium: 142 mmol/L (ref 135–145)
Total Bilirubin: 0.5 mg/dL (ref 0.3–1.2)
Total Protein: 7.6 g/dL (ref 6.5–8.1)

## 2021-08-06 LAB — I-STAT CHEM 8, ED
BUN: 10 mg/dL (ref 6–20)
Calcium, Ion: 1.15 mmol/L (ref 1.15–1.40)
Chloride: 108 mmol/L (ref 98–111)
Creatinine, Ser: 0.7 mg/dL (ref 0.44–1.00)
Glucose, Bld: 74 mg/dL (ref 70–99)
HCT: 35 % — ABNORMAL LOW (ref 36.0–46.0)
Hemoglobin: 11.9 g/dL — ABNORMAL LOW (ref 12.0–15.0)
Potassium: 3.3 mmol/L — ABNORMAL LOW (ref 3.5–5.1)
Sodium: 143 mmol/L (ref 135–145)
TCO2: 23 mmol/L (ref 22–32)

## 2021-08-06 LAB — CBC WITH DIFFERENTIAL/PLATELET
Abs Immature Granulocytes: 0.03 10*3/uL (ref 0.00–0.07)
Basophils Absolute: 0 10*3/uL (ref 0.0–0.1)
Basophils Relative: 0 %
Eosinophils Absolute: 0.1 10*3/uL (ref 0.0–0.5)
Eosinophils Relative: 1 %
HCT: 41.1 % (ref 36.0–46.0)
Hemoglobin: 13.5 g/dL (ref 12.0–15.0)
Immature Granulocytes: 0 %
Lymphocytes Relative: 26 %
Lymphs Abs: 2.1 10*3/uL (ref 0.7–4.0)
MCH: 28.4 pg (ref 26.0–34.0)
MCHC: 32.8 g/dL (ref 30.0–36.0)
MCV: 86.3 fL (ref 80.0–100.0)
Monocytes Absolute: 0.7 10*3/uL (ref 0.1–1.0)
Monocytes Relative: 9 %
Neutro Abs: 5.2 10*3/uL (ref 1.7–7.7)
Neutrophils Relative %: 64 %
Platelets: 345 10*3/uL (ref 150–400)
RBC: 4.76 MIL/uL (ref 3.87–5.11)
RDW: 14 % (ref 11.5–15.5)
WBC: 8.1 10*3/uL (ref 4.0–10.5)
nRBC: 0 % (ref 0.0–0.2)

## 2021-08-06 LAB — I-STAT BETA HCG BLOOD, ED (MC, WL, AP ONLY): I-stat hCG, quantitative: <5 m[IU]/mL (ref <5)

## 2021-08-06 LAB — URINALYSIS, ROUTINE W REFLEX MICROSCOPIC
Bacteria, UA: NONE SEEN
Bilirubin Urine: NEGATIVE
Glucose, UA: NEGATIVE mg/dL
Ketones, ur: NEGATIVE mg/dL
Leukocytes,Ua: NEGATIVE
Nitrite: NEGATIVE
Protein, ur: NEGATIVE mg/dL
RBC / HPF: >50 RBC/hpf — ABNORMAL HIGH (ref 0–5)
Specific Gravity, Urine: 1.01 (ref 1.005–1.030)
pH: 7 (ref 5.0–8.0)

## 2021-08-06 LAB — RESP PANEL BY RT-PCR (FLU A&B, COVID) ARPGX2
Influenza A by PCR: NEGATIVE
Influenza B by PCR: NEGATIVE
SARS Coronavirus 2 by RT PCR: NEGATIVE

## 2021-08-06 LAB — RAPID URINE DRUG SCREEN, HOSP PERFORMED
Amphetamines: POSITIVE — AB
Barbiturates: NOT DETECTED
Benzodiazepines: NOT DETECTED
Cocaine: NOT DETECTED
Opiates: NOT DETECTED
Tetrahydrocannabinol: NOT DETECTED

## 2021-08-06 LAB — TSH: TSH: 2.004 u[IU]/mL (ref 0.350–4.500)

## 2021-08-06 LAB — MAGNESIUM: Magnesium: 2.2 mg/dL (ref 1.7–2.4)

## 2021-08-06 MED ORDER — ACETAMINOPHEN 325 MG PO TABS
650.0000 mg | ORAL_TABLET | Freq: Four times a day (QID) | ORAL | Status: DC | PRN
  Administered 2021-08-06 – 2021-08-08 (×3): 650 mg via ORAL
  Filled 2021-08-06 (×2): qty 2

## 2021-08-06 MED ORDER — LISDEXAMFETAMINE DIMESYLATE 30 MG PO CAPS
60.0000 mg | ORAL_CAPSULE | Freq: Every day | ORAL | Status: DC
  Administered 2021-08-07 – 2021-08-08 (×2): 60 mg via ORAL
  Filled 2021-08-06 (×2): qty 2

## 2021-08-06 MED ORDER — ACETAMINOPHEN 650 MG RE SUPP: 650.0000 mg | Freq: Four times a day (QID) | RECTAL | Status: DC | PRN

## 2021-08-06 MED ORDER — VITAMIN B-6 100 MG PO TABS
100.0000 mg | ORAL_TABLET | Freq: Every day | ORAL | Status: DC
  Administered 2021-08-07 – 2021-08-08 (×2): 100 mg via ORAL
  Filled 2021-08-06 (×2): qty 1

## 2021-08-06 MED ORDER — BIOTIN 10 MG PO CAPS: 10.0000 mg | ORAL_CAPSULE | Freq: Every day | ORAL | Status: DC

## 2021-08-06 MED ORDER — ONDANSETRON HCL 4 MG/2ML IJ SOLN: 4.0000 mg | Freq: Four times a day (QID) | INTRAMUSCULAR | Status: DC | PRN

## 2021-08-06 MED ORDER — BUPROPION HCL ER (SR) 150 MG PO TB12
150.0000 mg | ORAL_TABLET | Freq: Every day | ORAL | Status: DC
  Administered 2021-08-07: 150 mg via ORAL
  Filled 2021-08-06: qty 1

## 2021-08-06 MED ORDER — DESVENLAFAXINE SUCCINATE ER 50 MG PO TB24
50.0000 mg | ORAL_TABLET | Freq: Every day | ORAL | Status: DC
  Administered 2021-08-07 – 2021-08-08 (×2): 50 mg via ORAL
  Filled 2021-08-06 (×2): qty 1

## 2021-08-06 MED ORDER — ONDANSETRON HCL 4 MG PO TABS: 4.0000 mg | ORAL_TABLET | Freq: Four times a day (QID) | ORAL | Status: DC | PRN

## 2021-08-06 MED ORDER — ACETAMINOPHEN 325 MG PO TABS
650.0000 mg | ORAL_TABLET | Freq: Four times a day (QID) | ORAL | Status: DC | PRN
  Filled 2021-08-06: qty 2

## 2021-08-06 MED ORDER — POTASSIUM CHLORIDE CRYS ER 20 MEQ PO TBCR
20.0000 meq | EXTENDED_RELEASE_TABLET | Freq: Once | ORAL | Status: AC
  Administered 2021-08-06: 20 meq via ORAL
  Filled 2021-08-06: qty 1

## 2021-08-06 MED ORDER — ENOXAPARIN SODIUM 40 MG/0.4ML IJ SOSY
40.0000 mg | PREFILLED_SYRINGE | INTRAMUSCULAR | Status: DC
  Administered 2021-08-06 – 2021-08-07 (×2): 40 mg via SUBCUTANEOUS
  Filled 2021-08-06 (×2): qty 0.4

## 2021-08-06 MED ORDER — APOAEQUORIN 20 MG PO CAPS: 20.0000 mg | ORAL_CAPSULE | Freq: Every day | ORAL | Status: DC

## 2021-08-06 NOTE — ED Notes (Signed)
Patient transported to MRI 

## 2021-08-06 NOTE — ED Triage Notes (Signed)
BIB EMS from [facility] imaging, was there for an MRI to evaluate for narcolepsy. Patient was at the front desk for check-in, passed out and was assisted to the ground. Has had a HA today a has felt weird over the last 1.5 years. Has a halter monitor on currently.

## 2021-08-06 NOTE — H&P (Addendum)
History and Physical    Cheryl Acevedo:[id number] DOB: 1991/01/24 DOA: 08/06/2021  PCP: Medicine, [facility]  Patient coming from: Home  I have personally briefly reviewed patient's old medical records in [facility]  Chief Complaint: Syncope  HPI: [name] is a 30 y.o. female with medical history significant of narcolepsy and cataplexy, MDD, schizoaffective.  Pt in to ED after syncopal event.  Follows with [facility] for narcolepsy and cataplexy.  Seen by cardiology and neurology.  Scheduled for MRI today, went to [place] rads to see if she could get this done sooner.  Had LOC episode while there.  Over the past month she has been noticing increased forgetfulness, brain fog, having headaches and periods where she feels like she is substituting the wrong words and having difficulty speaking.  Holter monitor in place by cards.  No fevers, chills.   ED Course: Neuro wants pt admitted to [facility] for 24h EEG.   Review of Systems: As per HPI, otherwise all review of systems negative.  Past Medical History:  Diagnosis Date   ACL (anterior cruciate ligament) tear    left   Anxiety    Bipolar disorder (HCC)    Depression    Obsessive compulsive disorder    Paranoia (HCC)    S/P tubal ligation 03/26/2020   SVD (spontaneous vaginal delivery) 03/26/2020    Past Surgical History:  Procedure Laterality Date   COSMETIC SURGERY     TUBAL LIGATION N/A 03/26/2020   Procedure: POST PARTUM TUBAL LIGATION;  Surgeon: Sherian Rein, MD;  Location: [facility] LD ORS;  Service: Gynecology;  Laterality: N/A;   WISDOM TOOTH EXTRACTION Bilateral 2000   x4     reports that she has never smoked. She has never used smokeless tobacco. She reports that she does not drink alcohol and does not use drugs.  No Known Allergies  Family History  Problem Relation Age of Onset   Cancer Mother    Diabetes Other    Cancer Other      Prior to Admission medications    Medication Sig Start Date End Date Taking? Authorizing Provider  Apoaequorin (PREVAGEN EXTRA STRENGTH) 20 MG CAPS Take 20 mg by mouth daily.   Yes [provider]  ARIPiprazole ER (ABILIFY MAINTENA) 400 MG PRSY prefilled syringe Inject 400 mg into the muscle every 28 (twenty-eight) days.    Yes [provider]  Aspirin-Salicylamide-Caffeine (BC HEADACHE POWDER PO) Take 1 packet by mouth daily as needed (headache).   Yes [provider]  Biotin 10 MG CAPS Take 10 mg by mouth daily.   Yes [provider]  buPROPion (WELLBUTRIN SR) 150 MG 12 hr tablet Take 150 mg by mouth daily.   Yes [provider]  desvenlafaxine (PRISTIQ) 50 MG 24 hr tablet Take 50 mg by mouth daily.   Yes [provider]  lisdexamfetamine (VYVANSE) 60 MG capsule Take 60 mg by mouth every morning.   Yes [provider]  pyridOXINE (VITAMIN B-6) 100 MG tablet Take 100 mg by mouth daily.   Yes [provider]  ibuprofen (ADVIL) 600 MG tablet Take 1 tablet (600 mg total) by mouth every 6 (six) hours as needed for cramping. Patient not taking: No sig reported 03/28/20   Pryor Ochoa Deering, DO    Physical Exam: Vitals:   08/06/21 1800 08/06/21 1830 08/06/21 1930 08/06/21 2045  BP: 117/83 113/83 114/77 121/87  Pulse: 78 75 75 83  Resp: (!) 23 20 20  )  22  Temp:      TempSrc:      SpO2: 100% 100% 100% 100%  Weight:      Height:        Constitutional: NAD, calm, comfortable Eyes: PERRL, lids and conjunctivae normal ENMT: Mucous membranes are moist. Posterior pharynx clear of any exudate or lesions.Normal dentition.  Neck: normal, supple, no masses, no thyromegaly Respiratory: clear to auscultation bilaterally, no wheezing, no crackles. Normal respiratory effort. No accessory muscle use.  Cardiovascular: Regular rate and rhythm, no murmurs / rubs / gallops. No extremity edema. 2+ pedal pulses. No carotid bruits.  Abdomen: no tenderness, no masses  palpated. No hepatosplenomegaly. Bowel sounds positive.  Musculoskeletal: no clubbing / cyanosis. No joint deformity upper and lower extremities. Good ROM, no contractures. Normal muscle tone.  Skin: no rashes, lesions, ulcers. No induration Neurologic: CN 2-12 grossly intact. Sensation intact, DTR normal. Strength 5/5 in all 4.  Psychiatric: Normal judgment and insight. Alert and oriented x 3. Normal mood.    Labs on Admission: I have personally reviewed following labs and imaging studies  CBC: Recent Labs  Lab 08/06/21 1732 08/06/21 1932  WBC 8.1  --   NEUTROABS 5.2  --   HGB 13.5 11.9*  HCT 41.1 35.0*  MCV 86.3  --   PLT 345  --    Basic Metabolic Panel: Recent Labs  Lab 08/06/21 1732 08/06/21 1932  NA 142 143  K 3.3* 3.3*  CL 106 108  CO2 27  --   GLUCOSE 115* 74  BUN 13 10  CREATININE 0.75 0.70  CALCIUM 9.3  --    GFR: Estimated Creatinine Clearance: 124.8 mL/min (by C-G formula based on SCr of 0.7 mg/dL). Liver Function Tests: Recent Labs  Lab 08/06/21 1732  AST 20  ALT 16  ALKPHOS 65  BILITOT 0.5  PROT 7.6  ALBUMIN 4.5   No results for input(s): LIPASE, AMYLASE in the last 168 hours. No results for input(s): AMMONIA in the last 168 hours. Coagulation Profile: No results for input(s): INR, PROTIME in the last 168 hours. Cardiac Enzymes: No results for input(s): CKTOTAL, CKMB, CKMBINDEX, TROPONINI in the last 168 hours. BNP (last 3 results) No results for input(s): PROBNP in the last 8760 hours. HbA1C: No results for input(s): HGBA1C in the last 72 hours. CBG: No results for input(s): GLUCAP in the last 168 hours. Lipid Profile: No results for input(s): CHOL, HDL, LDLCALC, TRIG, CHOLHDL, LDLDIRECT in the last 72 hours. Thyroid Function Tests: Recent Labs    08/06/21 1732  TSH 2.004   Anemia Panel: No results for input(s): VITAMINB12, FOLATE, FERRITIN, TIBC, IRON, RETICCTPCT in the last 72 hours. Urine analysis:    Component Value Date/Time    COLORURINE YELLOW 08/06/2021 1716   APPEARANCEUR CLEAR 08/06/2021 1716   LABSPEC 1.010 08/06/2021 1716   PHURINE 7.0 08/06/2021 1716   GLUCOSEU NEGATIVE 08/06/2021 1716   HGBUR LARGE (A) 08/06/2021 1716   BILIRUBINUR NEGATIVE 08/06/2021 1716   KETONESUR NEGATIVE 08/06/2021 1716   PROTEINUR NEGATIVE 08/06/2021 1716   UROBILINOGEN 1.0 02/06/2012 2357   NITRITE NEGATIVE 08/06/2021 1716   LEUKOCYTESUR NEGATIVE 08/06/2021 1716    Radiological Exams on Admission: CT Head Wo Contrast  Result Date: 08/06/2021 CLINICAL DATA:  Mental status change.  Headache. EXAM: CT HEAD WITHOUT CONTRAST TECHNIQUE: Contiguous axial images were obtained from the base of the skull through the vertex without intravenous contrast. COMPARISON:  None. FINDINGS: Brain: No evidence of acute infarction, hemorrhage, hydrocephalus, extra-axial collection or  mass lesion/mass effect. Vascular: No hyperdense vessel or unexpected calcification. Skull: Normal. Negative for fracture or focal lesion. Sinuses/Orbits: No acute finding. Other: None. IMPRESSION: No acute intracranial abnormalities. Normal brain. Electronically Signed   By: Signa Kell M.D.   On: 08/06/2021 18:40    EKG: Independently reviewed.  Assessment/Plan Principal Problem:   Narcolepsy and cataplexy Active Problems:   Major depressive disorder, recurrent (HCC)    Narcolepsy and cataplexy - Syncope episodes Dr. Otelia Limes wants admit for cont EEG monitoring Will also get MRI brain And 2d echo Tele monitor Cont vyvanse for the moment Seizure precautions Replace K MDD - Cont home meds  DVT prophylaxis: Lovenox Code Status: Full Family Communication: Family at bedside Disposition Plan: Home when cleared by neuro Consults called: Dr. Otelia Limes Admission status: Place in obs    Dawud Mays M. DO [facility]  How to contact the [facility] Attending or Consulting provider 7A - 7P or covering provider during after hours 7P -7A, for this  patient?  Check the care team in [facility] and look for a) attending/consulting TR[facility] provider listed and b) the [facility] team listed Log into [email or web address]  Amion Physician Scheduling and messaging for groups and whole hospitals  On call and physician scheduling software for group practices, residents, hospitalists and other medical providers for call, clinic, rotation and shift schedules. OnCall Enterprise is a hospital-wide system for scheduling doctors and paging doctors on call. EasyPlot is for scientific plotting and data analysis.  [email or web address]  and use 's universal password to access. If you do not have the password, please contact the hospital operator.  Locate the [facility] provider you are looking for under [facility] and page to a number that you can be directly reached. If you still have difficulty reaching the provider, please page the [facility] (Director on Call) for the Hospitalists listed on amion for assistance.  08/06/2021, 10:37 PM

## 2021-08-06 NOTE — ED Notes (Signed)
Pt ambulatory to bathroom with no assistance

## 2021-08-06 NOTE — ED Provider Notes (Signed)
Emergency Medicine Provider Triage Evaluation Note  Cheryl Acevedo , a 30 y.o. female  was evaluated in triage.  Pt complains of syncopal event.  She primarily follows with [facility].  She is seen by cardiology and neurology for possible narcolepsy with cataplexy.  She is scheduled for an MRI and went to [facility] radiology today to see if they could get her MRI done sooner, and while there she had a loss of consciousness episode.  She states that over the past month she has been noticing increased forgetfulness, brain fog, having headaches and periods where she feels like she is substituting the wrong words and having difficulty speaking.  She has not had any imaging on her head in the past 2 years per her report.  She has a Holter monitor on that was placed recently by cardiology.  Review of Systems  Positive: LOC, facial twitching, tachycardia Negative: Chest pain  Physical Exam  BP 124/81 (BP Location: Left Arm)   Pulse 87   Temp 97.6 F (36.4 C) (Oral)   Resp 18   Ht 5\' 11"  (1.803 m)   Wt 86 kg   LMP 08/03/2021 (Exact Date)   SpO2 100%   BMI 26.44 kg/m  Gen:   Awake, no distress   Resp:  Normal effort  MSK:   Moves extremities without difficulty  Other:  Speech is not slurred.   Medical Decision Making  Medically screening exam initiated at 5:00 PM.  Appropriate orders placed.  Cheryl Acevedo was informed that the remainder of the evaluation will be completed by another provider, this initial triage assessment does not replace that evaluation, and the importance of remaining in the ED until their evaluation is complete.    1700: While I was in the room talking with patient she got a vacant look in her eyes, rested her head on her hands and stopped answering questions.  She became very tachycardic on the monitor up into the 150s and had a full loss of tone.  She was fully unconscious.  She did not fall and was helped to lay back.  She did not have any nystagmus or movements.  She  started to regain consciousness, opened her eyes and was looking around.  She was able to follow commands to open and close her eyes but could otherwise not move. She did not have nystagmus during this, would blink to threat however was nonverbal verbal and could not move when instructed to. She then regained consciousness, was very tearful, confused, screaming and anxious/upset.  This lasted for about a minute, during that she stated that she had difficulty closing her mouth.  She was noted to have some mild right-sided facial twitching during this, again with no nystagmus or tonic-clonic activity. She did not have a traditional postictal period.  The entire episode lasted about 5 minutes.  After this she was very scared per her report, was tearful and was able to be calm down.  EKG was captured when she became tachycardic.   Patient taken back to a room.   Note: Portions of this report may have been transcribed using voice recognition software. Every effort was made to ensure accuracy; however, inadvertent computerized transcription errors may be present    Lise Auer, PA-C 08/06/21 1723    10/06/21, DO 08/06/21 2141

## 2021-08-06 NOTE — Progress Notes (Signed)
PHARMACIST - PHYSICIAN ORDER COMMUNICATION  CONCERNING: P&T Medication Policy on Herbal Medications  DESCRIPTION:  This patient's order for:  apoaequorin and biotin  has been noted.  This product(s) is classified as an "herbal" or natural product. Due to a lack of definitive safety studies or FDA approval, nonstandard manufacturing practices, plus the potential risk of unknown drug-drug interactions while on inpatient medications, the Pharmacy and Therapeutics Committee does not permit the use of "herbal" or natural products of this type within [facility].   ACTION TAKEN: The pharmacy department is unable to verify this order at this time and your patient has been informed of this safety policy. Please reevaluate patient's clinical condition at discharge and address if the herbal or natural product(s) should be resumed at that time.   Arley Phenix RPh 08/06/2021, 10:55 PM

## 2021-08-07 ENCOUNTER — Inpatient Hospital Stay (HOSPITAL_COMMUNITY): Payer: Medicare Other

## 2021-08-07 ENCOUNTER — Observation Stay (HOSPITAL_COMMUNITY): Payer: Medicare Other

## 2021-08-07 ENCOUNTER — Encounter (HOSPITAL_COMMUNITY): Payer: Self-pay | Admitting: Internal Medicine

## 2021-08-07 DIAGNOSIS — R4182 Altered mental status, unspecified: Secondary | ICD-10-CM | POA: Diagnosis present

## 2021-08-07 DIAGNOSIS — R569 Unspecified convulsions: Secondary | ICD-10-CM | POA: Diagnosis not present

## 2021-08-07 DIAGNOSIS — E876 Hypokalemia: Secondary | ICD-10-CM | POA: Diagnosis present

## 2021-08-07 DIAGNOSIS — R55 Syncope and collapse: Secondary | ICD-10-CM | POA: Diagnosis not present

## 2021-08-07 DIAGNOSIS — F445 Conversion disorder with seizures or convulsions: Secondary | ICD-10-CM | POA: Diagnosis present

## 2021-08-07 DIAGNOSIS — Z79899 Other long term (current) drug therapy: Secondary | ICD-10-CM | POA: Diagnosis not present

## 2021-08-07 DIAGNOSIS — F259 Schizoaffective disorder, unspecified: Secondary | ICD-10-CM | POA: Diagnosis present

## 2021-08-07 DIAGNOSIS — Z20822 Contact with and (suspected) exposure to covid-19: Secondary | ICD-10-CM | POA: Diagnosis present

## 2021-08-07 DIAGNOSIS — Z833 Family history of diabetes mellitus: Secondary | ICD-10-CM | POA: Diagnosis not present

## 2021-08-07 DIAGNOSIS — F429 Obsessive-compulsive disorder, unspecified: Secondary | ICD-10-CM | POA: Diagnosis present

## 2021-08-07 DIAGNOSIS — F410 Panic disorder [episodic paroxysmal anxiety] without agoraphobia: Secondary | ICD-10-CM | POA: Diagnosis present

## 2021-08-07 DIAGNOSIS — G47411 Narcolepsy with cataplexy: Secondary | ICD-10-CM | POA: Diagnosis present

## 2021-08-07 DIAGNOSIS — F339 Major depressive disorder, recurrent, unspecified: Secondary | ICD-10-CM | POA: Diagnosis not present

## 2021-08-07 DIAGNOSIS — I452 Bifascicular block: Secondary | ICD-10-CM | POA: Diagnosis present

## 2021-08-07 LAB — CBC
HCT: 37.4 % (ref 36.0–46.0)
Hemoglobin: 12.2 g/dL (ref 12.0–15.0)
MCH: 28.4 pg (ref 26.0–34.0)
MCHC: 32.6 g/dL (ref 30.0–36.0)
MCV: 87 fL (ref 80.0–100.0)
Platelets: 294 10*3/uL (ref 150–400)
RBC: 4.3 MIL/uL (ref 3.87–5.11)
RDW: 14 % (ref 11.5–15.5)
WBC: 7 10*3/uL (ref 4.0–10.5)
nRBC: 0 % (ref 0.0–0.2)

## 2021-08-07 LAB — BASIC METABOLIC PANEL
Anion gap: 7 (ref 5–15)
BUN: 18 mg/dL (ref 6–20)
CO2: 26 mmol/L (ref 22–32)
Calcium: 9.5 mg/dL (ref 8.9–10.3)
Chloride: 109 mmol/L (ref 98–111)
Creatinine, Ser: 0.81 mg/dL (ref 0.44–1.00)
GFR, Estimated: >60 mL/min (ref >60)
Glucose, Bld: 95 mg/dL (ref 70–99)
Potassium: 4.3 mmol/L (ref 3.5–5.1)
Sodium: 142 mmol/L (ref 135–145)

## 2021-08-07 LAB — HIV ANTIBODY (ROUTINE TESTING W REFLEX): HIV Screen 4th Generation wRfx: NONREACTIVE

## 2021-08-07 LAB — ECHOCARDIOGRAM COMPLETE
Area-P 1/2: 3.42 cm2
Calc EF: 57.7 %
Height: 71 in
S' Lateral: 3 cm
Single Plane A2C EF: 51.4 %
Single Plane A4C EF: 64.3 %
Weight: 3033.53 oz

## 2021-08-07 MED ORDER — BUPROPION HCL ER (SR) 100 MG PO TB12
100.0000 mg | ORAL_TABLET | Freq: Every day | ORAL | Status: DC
  Administered 2021-08-08: 100 mg via ORAL
  Filled 2021-08-07: qty 1

## 2021-08-07 MED ORDER — PANTOPRAZOLE SODIUM 40 MG PO TBEC
40.0000 mg | DELAYED_RELEASE_TABLET | Freq: Every day | ORAL | Status: DC
  Administered 2021-08-07 – 2021-08-08 (×2): 40 mg via ORAL
  Filled 2021-08-07 (×2): qty 1

## 2021-08-07 NOTE — ED Notes (Signed)
[facility] called for transport to [facility].  

## 2021-08-07 NOTE — Consult Note (Signed)
Neurology Consultation  Reason for Consult: seizure like activity.  Referring Physician: Hinton Dyer., MD.   CC: seizure like activity.   History is obtained from: chart.    HPI: Cheryl Acevedo is a 30 yo female with a PMHx of narcolepsy, cataplexy, Bipolar,OCD, and schizophrenia who presented 18 hours ago with c/o syncope vs Seizure activity. She described episodes where she will drop her head, then yell out with mouth staying open. She states she can hear people talking to her and understands what they are saying. She [place] ride [place] the fair, and dropped her head on the ride and started the telling with her mouth staying open. She says she can feel her heart beat in her neck prior to a spell and yesterday her HR was up to 140s.   No seizure history. No staring spells, twitching, jerking prior. Her cousin is in the room and let NP watch a video of patient during one episode. The above occurred, but no shaking noted with event.   In recent past, she has had difficulty with forgetfulness, brain fog, HAs and word finding.   In review of chart, she had an appointment at [facility] for her symptoms above and patient was scheduled for an EEG and MRI brain. Patient was referred to [facility] [place]. Patient states she was placed on Wellbutrin and is up to 150mg  dose now. Per records she has been to various places and seen for narcolepsy and mood issues.   Cardiology is involved and Holter was placed.    ROS: A robust ROS was performed and is negative except as noted in the HPI.    Family History  Problem Relation Age of Onset   Cancer Mother    Diabetes Other    Cancer Other   narcolepsy   Social History:   reports that she has never smoked. She has never used smokeless tobacco. She reports that she does not drink alcohol and does not use drugs.  Medications  Current Facility-Administered Medications:    acetaminophen (TYLENOL) tablet 650 mg, 650 mg, Oral,  Q6H PRN, 650 mg at 08/06/21 2159 **OR** acetaminophen (TYLENOL) suppository 650 mg, 650 mg, Rectal, Q6H PRN, 2160, Jared M, DO   buPROPion [facility] SR) 12 hr tablet 150 mg, 150 mg, Oral, Daily, [facility], Jared M, DO, 150 mg at 08/07/21 1027   desvenlafaxine (PRISTIQ) 24 hr tablet 50 mg, 50 mg, Oral, Daily, 10/07/21, Jared M, DO, 50 mg at 08/07/21 1028   enoxaparin (LOVENOX) injection 40 mg, 40 mg, Subcutaneous, Q24H, 10/07/21, Jared M, DO, 40 mg at 08/06/21 2204   lisdexamfetamine (VYVANSE) capsule 60 mg, 60 mg, Oral, Daily, 2205, Jared M, DO, 60 mg at 08/07/21 1027   ondansetron (ZOFRAN) tablet 4 mg, 4 mg, Oral, Q6H PRN **OR** ondansetron (ZOFRAN) injection 4 mg, 4 mg, Intravenous, Q6H PRN, 10/07/21, Jared M, DO   pyridOXINE (VITAMIN B-6) tablet 100 mg, 100 mg, Oral, Daily, 07-20-1999, Jared M, DO, 100 mg at 08/07/21 1029  Current Outpatient Medications:    Apoaequorin (PREVAGEN EXTRA STRENGTH) 20 MG CAPS, Take 20 mg by mouth daily., Disp: , Rfl:    ARIPiprazole ER (ABILIFY MAINTENA) 400 MG PRSY prefilled syringe, Inject 400 mg into the muscle every 28 (twenty-eight) days. , Disp: , Rfl:    Aspirin-Salicylamide-Caffeine (BC HEADACHE POWDER PO), Take 1 packet by mouth daily as needed (headache)., Disp: , Rfl:    Biotin 10 MG CAPS, Take 10 mg by mouth daily., Disp: ,  Rfl:    buPROPion (WELLBUTRIN SR) 150 MG 12 hr tablet, Take 150 mg by mouth daily., Disp: , Rfl:    desvenlafaxine (PRISTIQ) 50 MG 24 hr tablet, Take 50 mg by mouth daily., Disp: , Rfl:    lisdexamfetamine (VYVANSE) 60 MG capsule, Take 60 mg by mouth every morning., Disp: , Rfl:    pyridOXINE (VITAMIN B-6) 100 MG tablet, Take 100 mg by mouth daily., Disp: , Rfl:    ibuprofen (ADVIL) 600 MG tablet, Take 1 tablet (600 mg total) by mouth every 6 (six) hours as needed for cramping. (Patient not taking: No sig reported), Disp: 40 tablet, Rfl: 1   Exam: Current vital signs: BP 107/67   Pulse 80   Temp 97.6 F (36.4 C) (Oral)   Resp  18   Ht 5\' 11"  (1.803 m)   Wt 86 kg   LMP 08/03/2021 (Exact Date)   SpO2 100%   BMI 26.44 kg/m  Vital signs in last 24 hours: Temp:  [97.6 F (36.4 C)] 97.6 F (36.4 C) (10/04 1652) Pulse Rate:  [58-99] 80 (10/05 1100) Resp:  [14-23] 18 (10/05 1100) BP: (91-129)/(60-87) 107/67 (10/05 1100) SpO2:  [97 %-100 %] 100 % (10/05 1100) Weight:  [86 kg] 86 kg (10/04 1649)  PE: GENERAL: Very well appearing. Awake, alert in NAD. HEENT: - Normocephalic and atraumatic, moist mucous membranes. LUNGS - Normal respiratory effort.  CV - RRR. HR 92.  ABDOMEN - Soft, nontender. Ext: warm, well perfused. Psych: Affect appropriate to situation.  NEURO:  Mental Status: Awake, alert, and oriented x 4.  Speech/Language: speech is without dysarthria or aphasia. Naming, repetition, fluency, and comprehension intact. Cranial Nerves:  II: PERRL 5 mm/brisk. visual fields full. III, IV, VI: EOMI. Lid elevation symmetric and full.  V: sensation is intact and symmetrical to face.  VII: Smile is symmetrical.  VIII:hearing intact to voice. IX, X: palate elevation is symmetric. Phonation normal.  XI: normal sternocleidomastoid and trapezius muscle strength. 05-05-2005 is symmetrical without fasciculations.   Motor:  Strength 5/5 throughout.  Tone is normal. Bulk is normal.  Sensation- Intact to light touch bilaterally in all four extremities. Extinction absent to DSS.  Coordination: FTN intact bilaterally. HKS intact bilaterally. No drift.  DTRs:  2+. Gait: deferred   CBC    Component Value Date/Time   WBC 7.0 08/07/2021 0351   RBC 4.30 08/07/2021 0351   HGB 12.2 08/07/2021 0351   HCT 37.4 08/07/2021 0351   PLT 294 08/07/2021 0351   MCV 87.0 08/07/2021 0351   MCH 28.4 08/07/2021 0351   MCHC 32.6 08/07/2021 0351   RDW 14.0 08/07/2021 0351   LYMPHSABS 2.1 08/06/2021 1732   MONOABS 0.7 08/06/2021 1732   EOSABS 0.1 08/06/2021 1732   BASOSABS 0.0 08/06/2021 1732    CMP     Component Value  Date/Time   NA 142 08/07/2021 0351   K 4.3 08/07/2021 0351   CL 109 08/07/2021 0351   CO2 26 08/07/2021 0351   GLUCOSE 95 08/07/2021 0351   BUN 18 08/07/2021 0351   CREATININE 0.81 08/07/2021 0351   CALCIUM 9.5 08/07/2021 0351   PROT 7.6 08/06/2021 1732   ALBUMIN 4.5 08/06/2021 1732   AST 20 08/06/2021 1732   ALT 16 08/06/2021 1732   ALKPHOS 65 08/06/2021 1732   BILITOT 0.5 08/06/2021 1732   GFRNONAA >60 08/07/2021 0351   GFRAA >60 02/24/2018 1449    Imaging  MRI brain personally reviewed by attending MD, agree with radiology: Normal  brain MRI.  No acute intracranial abnormality.  Assessment: 30 yo female with multiple psychiatric diagnoses who presented after seizure like activity yesterday and has had the same type spells at home. She is to be transferred to [facility] for an EEG with video for spell characterization  Recommendations: -f/up cEEG.  -f/up Holter per cardiology.  -Continue current psychiatric medications at this time, but if evidence of epileptogenic activity is demonstrated will recommend tapering Wellbutrin and replacing with an alternative agent that does not lower the seizure threshold to the same degree  Pt seen by Jimmye Norman, NP/Neuro and later by MD. Note/plan to be edited by MD as needed.  Pager: [phone]  Attending Neurologist's note:  I personally saw this patient, gathering history, including a limited examination given patient was sleeping comfortably on my arrival to her room, reviewing relevant labs, personally reviewing relevant imaging including MRI brain, and formulated the assessment and plan, adding the note above for completeness and clarity to accurately reflect my thoughts   Brooke Dare MD-PhD [facility] [phone] Available 7 AM to 7 PM, outside these hours please contact Neurologist on call listed on AMION

## 2021-08-07 NOTE — ED Provider Notes (Signed)
[facility]-EMERGENCY DEPT Provider Note   CSN: [id number] Arrival date & time: 08/06/21  1643     History Chief Complaint  Patient presents with   Loss of Consciousness    Cheryl Acevedo is a 30 y.o. female.  Presents to ER with concern for episodes of syncope versus seizure.  Patient states that the over past few days she has been having multiple episodes each day of passing out or possibly seizures.  Seem to occur at random. Sometimes maintains conciousness but feels like she loses control of her body. Saw both cardiology and neurology today in the outpatient setting.  She was given a 30-day Holter monitor.  Neurology ordered outpatient MRI.  When she went to the office to get the MRI done, she had another episode of passing out.  Patient then had another episode in the triage in the ER.  ER PA witnessed.  Episode lasted ~30 seconds. She  Quickly regained conciousness, but was noted to be very tearful, anxious and upset. Has had headache. Denies numbness, weakness, speech or vision change.  No bladder or bowel incontinence. No lip or tongue biting.     HPI     Past Medical History:  Diagnosis Date   ACL (anterior cruciate ligament) tear    left   Anxiety    Bipolar disorder (HCC)    Depression    Obsessive compulsive disorder    Paranoia (HCC)    S/P tubal ligation 03/26/2020   SVD (spontaneous vaginal delivery) 03/26/2020    Patient Active Problem List   Diagnosis Date Noted   Narcolepsy and cataplexy 08/06/2021   Labor and delivery indication for care or intervention 03/26/2020   SVD (spontaneous vaginal delivery) 03/26/2020   S/P tubal ligation 03/26/2020   Normal labor 04/22/2017   Postpartum care following vaginal delivery 04/22/2017   Schizoaffective disorder, bipolar type (HCC) 02/20/2012   Major depressive disorder, recurrent (HCC) 02/17/2012   OCD (obsessive compulsive disorder) 02/17/2012   Generalized anxiety disorder 02/17/2012     Past Surgical History:  Procedure Laterality Date   COSMETIC SURGERY     TUBAL LIGATION N/A 03/26/2020   Procedure: POST PARTUM TUBAL LIGATION;  Surgeon: Sherian Rein, MD;  Location: [facility] LD ORS;  Service: Gynecology;  Laterality: N/A;   WISDOM TOOTH EXTRACTION Bilateral 2000   x4     OB History     Gravida  3   Para  2   Term  2   Preterm      AB  1   Living  2      SAB      IAB  1   Ectopic      Multiple  0   Live Births  2           Family History  Problem Relation Age of Onset   Cancer Mother    Diabetes Other    Cancer Other     Social History   Tobacco Use   Smoking status: Never   Smokeless tobacco: Never  Vaping Use   Vaping Use: Never used  Substance Use Topics   Alcohol use: No    Comment: Occasional   Drug use: No    Home Medications Prior to Admission medications   Medication Sig Start Date End Date Taking? Authorizing Provider  Apoaequorin (PREVAGEN EXTRA STRENGTH) 20 MG CAPS Take 20 mg by mouth daily.   Yes [provider]  ARIPiprazole ER (ABILIFY MAINTENA) 400 MG PRSY prefilled  syringe Inject 400 mg into the muscle every 28 (twenty-eight) days.    Yes [provider]  Aspirin-Salicylamide-Caffeine (BC HEADACHE POWDER PO) Take 1 packet by mouth daily as needed (headache).   Yes [provider]  Biotin 10 MG CAPS Take 10 mg by mouth daily.   Yes [provider]  buPROPion (WELLBUTRIN SR) 150 MG 12 hr tablet Take 150 mg by mouth daily.   Yes [provider]  desvenlafaxine (PRISTIQ) 50 MG 24 hr tablet Take 50 mg by mouth daily.   Yes [provider]  lisdexamfetamine (VYVANSE) 60 MG capsule Take 60 mg by mouth every morning.   Yes [provider]  pyridOXINE (VITAMIN B-6) 100 MG tablet Take 100 mg by mouth daily.   Yes [provider]  ibuprofen (ADVIL) 600 MG tablet Take 1 tablet (600 mg total) by mouth every 6 (six) hours as needed for  cramping. Patient not taking: No sig reported 03/28/20   Edwinna Areola, DO    Allergies    Patient has no known allergies.  Review of Systems   Review of Systems  Constitutional:  Positive for fatigue. Negative for chills and fever.  HENT:  Negative for ear pain and sore throat.   Eyes:  Negative for pain and visual disturbance.  Respiratory:  Negative for cough and shortness of breath.   Cardiovascular:  Negative for chest pain and palpitations.  Gastrointestinal:  Negative for abdominal pain and vomiting.  Genitourinary:  Negative for dysuria and hematuria.  Musculoskeletal:  Negative for arthralgias and back pain.  Skin:  Negative for color change and rash.  Neurological:  Positive for seizures and syncope.  All other systems reviewed and are negative.  Physical Exam Updated Vital Signs BP 104/60   Pulse 68   Temp 97.6 F (36.4 C) (Oral)   Resp 16   Ht 5\' 11"  (1.803 m)   Wt 86 kg   LMP 08/03/2021 (Exact Date)   SpO2 97%   BMI 26.44 kg/m   Physical Exam Vitals and nursing note reviewed.  Constitutional:      General: She is not in acute distress.    Appearance: She is well-developed.  HENT:     Head: Normocephalic and atraumatic.  Eyes:     Conjunctiva/sclera: Conjunctivae normal.  Cardiovascular:     Rate and Rhythm: Normal rate and regular rhythm.     Heart sounds: No murmur heard. Pulmonary:     Effort: Pulmonary effort is normal. No respiratory distress.     Breath sounds: Normal breath sounds.  Abdominal:     Palpations: Abdomen is soft.     Tenderness: There is no abdominal tenderness.  Musculoskeletal:        General: No deformity or signs of injury.     Cervical back: Neck supple.  Skin:    General: Skin is warm and dry.  Neurological:     General: No focal deficit present.     Mental Status: She is alert and oriented to person, place, and time.     Motor: No weakness.    ED Results / Procedures / Treatments   Labs (all labs ordered  are listed, but only abnormal results are displayed) Labs Reviewed  COMPREHENSIVE METABOLIC PANEL - Abnormal; Notable for the following components:      Result Value   Potassium 3.3 (*)    Glucose, Bld 115 (*)    All other components within normal limits  URINALYSIS, ROUTINE W REFLEX MICROSCOPIC -  Abnormal; Notable for the following components:   Hgb urine dipstick LARGE (*)    RBC / HPF >50 (*)    All other components within normal limits  RAPID URINE DRUG SCREEN, HOSP PERFORMED - Abnormal; Notable for the following components:   Amphetamines POSITIVE (*)    All other components within normal limits  I-STAT CHEM 8, ED - Abnormal; Notable for the following components:   Potassium 3.3 (*)    Hemoglobin 11.9 (*)    HCT 35.0 (*)    All other components within normal limits  RESP PANEL BY RT-PCR (FLU A&B, COVID) ARPGX2  CBC WITH DIFFERENTIAL/PLATELET  TSH  MAGNESIUM  HIV ANTIBODY (ROUTINE TESTING W REFLEX)  CBC  BASIC METABOLIC PANEL  I-STAT BETA HCG BLOOD, ED ([facility], [facility], [facility] ONLY)    EKG EKG Interpretation  Date/Time:  Tuesday August 06 2021 17:11:30 EDT Ventricular Rate:  131 PR Interval:  136 QRS Duration: 100 QT Interval:  290 QTC Calculation: 429 R Axis:   -61 Text Interpretation: Sinus tachycardia Ventricular premature complex Consider right atrial enlargement Incomplete RBBB and LAFB Low voltage, precordial leads Consider right ventricular hypertrophy Confirmed by Marianna Fuss ([id number]) on 08/06/2021 7:34:54 PM  Radiology CT Head Wo Contrast  Result Date: 08/06/2021 CLINICAL DATA:  Mental status change.  Headache. EXAM: CT HEAD WITHOUT CONTRAST TECHNIQUE: Contiguous axial images were obtained from the base of the skull through the vertex without intravenous contrast. COMPARISON:  None. FINDINGS: Brain: No evidence of acute infarction, hemorrhage, hydrocephalus, extra-axial collection or mass lesion/mass effect. Vascular: No hyperdense vessel or unexpected calcification.  Skull: Normal. Negative for fracture or focal lesion. Sinuses/Orbits: No acute finding. Other: None. IMPRESSION: No acute intracranial abnormalities. Normal brain. Electronically Signed   By: Signa Kell M.D.   On: 08/06/2021 18:40    Procedures Procedures   Medications Ordered in ED Medications  enoxaparin (LOVENOX) injection 40 mg (40 mg Subcutaneous Given 08/06/21 2204)  ondansetron (ZOFRAN) tablet 4 mg (has no administration in time range)    Or  ondansetron (ZOFRAN) injection 4 mg (has no administration in time range)  acetaminophen (TYLENOL) tablet 650 mg (650 mg Oral Given 08/06/21 2159)    Or  acetaminophen (TYLENOL) suppository 650 mg ( Rectal See Alternative 08/06/21 2159)  desvenlafaxine (PRISTIQ) 24 hr tablet 50 mg (has no administration in time range)  lisdexamfetamine (VYVANSE) capsule 60 mg (has no administration in time range)  pyridOXINE (VITAMIN B-6) tablet 100 mg (has no administration in time range)  buPROPion (WELLBUTRIN SR) 12 hr tablet 150 mg (has no administration in time range)  potassium chloride SA (KLOR-CON) CR tablet 20 mEq (20 mEq Oral Given 08/06/21 2258)    ED Course  I have reviewed the triage vital signs and the nursing notes.  Pertinent labs & imaging results that were available during my care of the patient were reviewed by me and considered in my medical decision making (see chart for details).    MDM Rules/Calculators/A&P                           30 year old lady presents to ER with concern for episodes of possible seizure versus syncope.  Has been previously diagnosed with narcolepsy.  No cardiac arrhythmias noted on monitor during patient's episode except for some tachycardia.  Basic labs stable, CT head stable.  Etiology for episodes not clear at present, differential includes but not limited to seizures, nonepileptic seizures, narcolepsy.  Discussed with Dr. Otelia Limes  who recommended admitting to [facility] for 24-hour EEG. On tele monitoring  currently. Discussed with Dr. Julian Reil who will come evaluate and admit patient.  Final Clinical Impression(s) / ED Diagnoses Final diagnoses:  Seizure-like activity (HCC)  Syncope, unspecified syncope type    Rx / DC Orders ED Discharge Orders     None        Milagros Loll, MD 08/07/21 [phone]

## 2021-08-07 NOTE — ED Notes (Signed)
This RN provided patients visitor with recliner. Visitor states she slept in regular chair all night.

## 2021-08-07 NOTE — ED Notes (Signed)
Spoke with bed placement to expedite [facility]. Per bed placement we are holding and they will make a note of patient needing transfer expedited. No estimated time.

## 2021-08-07 NOTE — Progress Notes (Signed)
  Echocardiogram 2D Echocardiogram has been performed.  Augustine Radar 08/07/2021, 8:54 AM

## 2021-08-07 NOTE — Progress Notes (Addendum)
PROGRESS NOTE    Cheryl Acevedo  KPT:[id number] DOB: 30-Dec-1990 DOA: 08/06/2021 PCP: Medicine, [facility]    Brief Narrative:  [name] was admitted with hospital diagnosis altered mental status.   30 year old female past medical history for narcolepsy, cataplexy, major depressive disorder, schizoaffective.  She presented with recurrent episodes of altered mentation, consistent with a tonic event, with no frank loss of consciousness or clonic movements, brief in duration with no postictal symptoms.  No apparent triggering or worsening factors.  No frank associated symptoms of loss of bowel, urinary incontinence or tongue biting. These events have been occurring more frequently, 1 episode about 3 days ago, another episode the following day and yesterday about 7 times.  Because of increased frequency of symptoms she was referred to brain MRI that she could not get because of the acute event occurring prior to imaging, she was brought to the hospital for further evaluation. She was recently placed on bupropion and for her psychiatric conditions. On her initial physical examination blood pressure 117/83, heart rate 78, respiratory rate 23, oxygen saturation 100%.  Her lungs are clear to auscultation bilaterally, heart S1-S2, present, rhythmic, soft abdomen, no lower extremity edema, patient was awake alert and oriented x3.  No neurologic focal abnormalities.  Sodium 142, potassium 3.3, chloride 106, bicarb 27, glucose 115, BUN 13, creatinine 0.75, white count 8.1, hemoglobin 13.5, hematocrit 41.1, platelets 345. SARS COVID-19 negative.  Urinalysis Pacific gravity 1.010, negative nitrates, 0-5 white cells,> 50 red cells.  Toxicology screen positive for amphetamines. Head CT negative for acute changes. Brain MRI normal  EKG 87 bpm, left axis deviation, left anterior fascicular block, right bundle branch block, sinus rhythm with PACs, no significant ST segment or T wave  changes.  Patient was continued on her psychiatric medications.  Neurology has been consulted, recommendations to transfer to [facility] for further neurologic work-up.  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  Patient is awake and alert, she had one episode during my visit, very brief in duration with no loss of consciousness, no clonic activity.   Plan continue neuro checks and transfer to [facility] for continuous EEG monitoring.  Continue with bupropion, desvenlafaxine, lisdexamfetamine and pyrdoxine.   2. Depression. No agitation.   3. Hypokalemia. K is up to 4,3, renal function is stable with serum cr at 0,81 and bicarbonate at 26.    Patient continue to be at high risk for recurrent episodes.   Status is: Observation  The patient will require care spanning > 2 midnights and should be moved to inpatient because: Inpatient level of care appropriate due to severity of illness  Dispo: The patient is from: Home              Anticipated d/c is to: Home              Patient currently is not medically stable to d/c.   Difficult to place patient No   DVT prophylaxis: Enoxaparin   Code Status:    full  Family Communication:   I spoke with patient's aunt at the bedside, we talked in detail about patient's condition, plan of care and prognosis and all questions were addressed.     Consultants:  Neurology    Subjective: Patient with no nausea or vomiting, no chest pain or dyspnea. Had one episode to tonic  posture during my examination, brief in duration, no postictal, no loss of continence or tongue biting    Objective: Vitals:   08/07/21 1000 08/07/21 1030 08/07/21 1100 08/07/21 1130  BP: 129/73 109/65 107/67 111/72  Pulse: 77 91 80 73  Resp: 20 (!) 22 18 19   Temp:      TempSrc:      SpO2: 100% 98% 100% 99%  Weight:       Height:       No intake or output data in the 24 hours ending 08/07/21 1137 Filed Weights   08/06/21 1649  Weight: 86 kg    Examination:   General: Not in pain or dyspnea Neurology: Awake and alert, non focal  E ENT: no pallor, no icterus, oral mucosa moist Cardiovascular: No JVD. S1-S2 present, rhythmic, no gallops, rubs, or murmurs. No lower extremity edema. Pulmonary: vesicular breath sounds bilaterally, adequate air movement, no wheezing, rhonchi or rales. Gastrointestinal. Abdomen soft and non tender Skin. No rashes Musculoskeletal: no joint deformities     Data Reviewed: I have personally reviewed following labs and imaging studies  CBC: Recent Labs  Lab 08/06/21 1732 08/06/21 1932 08/07/21 0351  WBC 8.1  --  7.0  NEUTROABS 5.2  --   --   HGB 13.5 11.9* 12.2  HCT 41.1 35.0* 37.4  MCV 86.3  --  87.0  PLT 345  --  294   Basic Metabolic Panel: Recent Labs  Lab 08/06/21 1732 08/06/21 1932 08/07/21 0351  NA 142 143 142  K 3.3* 3.3* 4.3  CL 106 108 109  CO2 27  --  26  GLUCOSE 115* 74 95  BUN 13 10 18   CREATININE 0.75 0.70 0.81  CALCIUM 9.3  --  9.5  MG 2.2  --   --    GFR: Estimated Creatinine Clearance: 123.3 mL/min (by C-G formula based on SCr of 0.81 mg/dL). Liver Function Tests: Recent Labs  Lab 08/06/21 1732  AST 20  ALT 16  ALKPHOS 65  BILITOT 0.5  PROT 7.6  ALBUMIN 4.5   No results for input(s): LIPASE, AMYLASE in the last 168 hours. No results for input(s): AMMONIA in the last 168 hours. Coagulation Profile: No results for input(s): INR, PROTIME in the last 168 hours. Cardiac Enzymes: No results for input(s): CKTOTAL, CKMB, CKMBINDEX, TROPONINI in the last 168 hours. BNP (last 3 results) No results for input(s): PROBNP in the last 8760 hours. HbA1C: No results for input(s): HGBA1C in the last 72 hours. CBG: No results for input(s): GLUCAP in the last 168 hours. Lipid Profile: No results for input(s): CHOL, HDL, LDLCALC, TRIG,  CHOLHDL, LDLDIRECT in the last 72 hours. Thyroid Function Tests: Recent Labs    08/06/21 1732  TSH 2.004   Anemia Panel: No results for input(s): VITAMINB12, FOLATE, FERRITIN, TIBC, IRON, RETICCTPCT in the last 72 hours.    Radiology Studies: I have reviewed all of the imaging during this hospital visit personally     Scheduled Meds:  [START ON 08/08/2021] buPROPion  100 mg Oral Daily   desvenlafaxine  50 mg Oral Daily   enoxaparin (LOVENOX) injection  40 mg Subcutaneous Q24H   lisdexamfetamine  60 mg Oral Daily   pyridOXINE  100 mg Oral Daily   Continuous Infusions:   LOS: 0 days        Ronette Hank 10/06/21, MD

## 2021-08-07 NOTE — Plan of Care (Signed)

## 2021-08-07 NOTE — ED Notes (Signed)
Pt given meal tray.

## 2021-08-07 NOTE — Progress Notes (Signed)
Started cEEG study.  Notified [facility] monitoring.  Tested patient event button. 

## 2021-08-07 NOTE — ED Notes (Addendum)
Pt and visitor report she had another "episode" or "seizure" like activity while Arrien, MD was at bedside. Pt reports the doctor was discussing different outcomes and it scared her causing her heart rate to increase and had a blank stare on her face.

## 2021-08-07 NOTE — ED Notes (Signed)
Spoke with EEG. States they do not have staff after 5pm and wanted to see if we can expedite her transfer. This RN will page MD to make him aware.

## 2021-08-07 NOTE — Progress Notes (Signed)
Patient being transferred to [facility] for LTM EEG - unsure what time patient will get to [facility] but will continue to check with team.

## 2021-08-08 DIAGNOSIS — R569 Unspecified convulsions: Secondary | ICD-10-CM

## 2021-08-08 MED ORDER — BUPROPION HCL ER (XL) 150 MG PO TB24: 150.0000 mg | ORAL_TABLET | Freq: Every day | ORAL | 3 refills | Status: AC

## 2021-08-08 MED ORDER — BUPROPION HCL ER (XL) 150 MG PO TB24: 300.0000 mg | ORAL_TABLET | Freq: Every day | ORAL | Status: DC

## 2021-08-08 MED ORDER — BUPROPION HCL ER (XL) 150 MG PO TB24: 150.0000 mg | ORAL_TABLET | Freq: Every day | ORAL | Status: DC

## 2021-08-08 MED ORDER — BUPROPION HCL ER (XL) 150 MG PO TB24: 150.0000 mg | ORAL_TABLET | Freq: Once | ORAL | Status: DC

## 2021-08-08 NOTE — Progress Notes (Signed)
LTM D/C by patient. Offered to clean pt's hair. Pt stated she will just wash it later. Tech didn't see any skin breakdown

## 2021-08-08 NOTE — Significant Event (Addendum)
Meeting with Dr. Sharl Ma, myself, patient's mother (Cheryl Acevedo), and NP Kirby-Graham  Mother expressed that their goal in this hospitalization was to get EEG done and MRI brain done as her outpatient providers had informed her that this was necessary for them to consider weaning Wellbutrin which she associates with a marked increase in her daughter's episodes, and she was never concerned about seizure activity herself.   She was very upset about the NP's interactions with herself and her family and felt the NP was rude, at which time the NP recused herself from the conversation.  [Redacted text at patient request]. I personally apologized for how difficult this hospitalization has been, and explained I cannot force anybody else to apologize for their actions but I am happy to file any reports she feels would be helpful.  Conversation was completed with Dr. Sharl Ma myself and patient's mother.  We discussed that the neurological work-up that was desired had been completed and I would defer to the outpatient providers  about further management of the patient's cataplexy and Wellbutrin dosing.  Mother expressed she did not feel safe taking the patient home due to the severity of her episodes and therefore inpatient psychiatry consultation regarding Wellbutrin weaning will be pursued.  At this time there is no further neurological work-up needed but neurology is happy to discuss further with the patient or family if questions about seizures or EEG and MRI here arise.  I defer any questions about her cataplexy diagnosis to her outpatient providers as this is not an inpatient issue that I routinely manage.  We are of course always available for new neurological questions or concerns as well.  Plan was discussed with mother and Dr. Sharl Ma and everyone expressed agreement  Cheryl Dare MD-PhD [facility] [phone]  Available 7 AM to 7 PM, outside these hours please contact Neurologist on call  listed on AMION

## 2021-08-08 NOTE — Procedures (Addendum)
Patient Name: Cheryl Acevedo  MRN: [id number]  Epilepsy Attending: Charlsie Quest  Referring Physician/Provider: Jimmye Norman, NP Duration: 08/07/2021 1740 to 08/08/2021 1446  Patient history: 30 yo female with multiple psychiatric diagnoses who presented after seizure like activity yesterday and has had the same type spells at home. EEG to evaluate for seizure  Level of alertness: Awake, asleep  AEDs during EEG study: None  Technical aspects: This EEG study was done with scalp electrodes positioned according to the 10-20 International system of electrode placement. Electrical activity was acquired at a sampling rate of 500Hz  and reviewed with a high frequency filter of 70Hz  and a low frequency filter of 1Hz . EEG data were recorded continuously and digitally stored.   Description: The posterior dominant rhythm consists of 9 Hz activity of moderate voltage (25-35 uV) seen predominantly in posterior head regions, symmetric and reactive to eye opening and eye closing. Sleep was characterized by vertex waves, sleep spindles (12 to 14 Hz), maximal frontocentral region. There is an excessive amount of 15 to 18 Hz beta activity distributed symmetrically and diffusely. Hyperventilation and photic stimulation were not performed.   Patient event button was pressed on 08/08/2021 at 1140. Patient was laying on floor, staring, less responsive. Concomitant eeg before,during and after the event didn't show any eeg change to suggest seizure.  Patient pulled all eeg electrodes after 1446 on 106/2022  IMPRESSION: This study is within normal limits. The excessive beta activity seen in the background is most likely due to the effect of benzodiazepine and is a benign EEG pattern. No seizures or epileptiform discharges were seen throughout the recording.  Patient event button was pressed on 08/08/2021 at 1140. Patient was laying on floor, staring, less responsive. There was no concomitant eeg change. This was a  NON-epileptic event.    Rhylan Kagel 10/08/2021

## 2021-08-08 NOTE — Plan of Care (Signed)
Neurology plan of care note:  No seizures seen on overnight EEG. 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 Can increase home Wellbutrin back to 150 as she was on at home, since we should not have to be concerned with lowering the seizure threshold. She should f/p with her regular psychiatrist on discharge.   Addendum: After talking to the hospitalists, we felt it prudent to check with Dr. Melynda Ripple to see if there were EEG changes related to this last spell she had around 11ish per RN note. Per Dr. Melynda Ripple, there were no EEG changes at the time of her spell, and this is not epilepsy. Patient has had LTM on since yesterday with out any EEG changes.   NP went to floor. NP and Dr.Lama went to room together and had a long discussion about there being no correlation on EEG with her spells. We encouraged her to f/up with her psychiatrist on discharge, as there is no medical reason to keep her here.   Family in room, told NP that I was saying the patient was faking it and calling her crazy. I replied, no, I have never said nor has any other person said those 2 words.   They are looking for an answer which is understandable. They think the starting of and increased dose of Wellbutrin triggered these episodes. NP informed them that psychiatrist will handle her medication changes when he/she consults. They asked if it could be cataplexy. NP informed them that cataplexy is a psychiatric management problem.   All of the sudden, patient started ripping her EEG leads out of her hair. She stood up and took off her gown exposing her breasts and just yelling out and marching around room, stating I don't want to be a lab rat. I just want to get better. Tried to reassure patient that we were here for her, but given this behavior, concern for cataplexy, and concern for medicines causing some issues, NP explained that we would get one of our psychiatrist to see her before discharge. Patient finally calmed down and RN helped her redress.  Please  see video of above event in the EEG lab.    Jimmye Norman, MSN, APN-BC Neurology Nurse Practitioner Pager [phone]

## 2021-08-08 NOTE — Discharge Summary (Addendum)
Physician Discharge Summary  Cheryl Acevedo:[id number] DOB: 1991/04/03 DOA: 08/06/2021  PCP: Medicine, [facility]  Admit date: 08/06/2021 Discharge date: 08/08/2021  Time spent: 60 minutes  Recommendations for Outpatient Follow-up:  Follow-up psychiatry as outpatient  Discharge Diagnoses:  Principal Problem:   Narcolepsy and cataplexy Active Problems:   Major depressive disorder, recurrent (HCC)   Altered mental status   Discharge Condition: Stable  Diet recommendation: Regular diet  Filed Weights   08/06/21 1649  Weight: 86 kg    History of present illness:  30 year old female with past medical history of narcolepsy, cataplexy, major depressive disorder, schizoaffective disorder presented with recurrent episodes of altered mentation with no tonic-clonic movements, brief in duration with no postictal symptoms.  There was no associated loss of bladder or bowel incontinence or tongue biting.  These episodes have been occurring more frequently, had 1 episode 3 days before admission and another episode 1 day before admission around 7 times. Because of these episodes she was referred to get brain MRI which could be obtained as patient had episode just before imaging.  She was recently placed on bupropion for her psychiatric illness. Toxicology screen was positive for amphetamines Head CT was negative for acute changes Brain MRI was normal Neurology was consulted and patient was transferred to [facility] for further work-up. Patient had LTM EEG monitoring hookup, and was being monitored with no epileptiform discharges noted or seizures noted on the EEG recording.    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 -She will need adjustment of her psych medications -Patient and her mother does not want to stay in the hospital for psychiatric consultation and want to be discharged -We will discharge her today  Agitation/anxiety/panic attack -Patient had episode of panic attack when she became agitated, after we told her that this is not a real seizure but PNES -She pulled out EEG leads and ripped off her gown -Later she was calm down by RN  -Psychiatry consultation was obtained -However patient and her mother does not want to stay in the hospital to be seen by psychiatrist tomorrow -They want to follow-up with their own psychiatrist as outpatient -We will cut down the dose of Wellbutrin XL to 150 mg p.o. daily -Continue home medications   Procedures: EEG Echocardiogram  Consultations: Neurology  Discharge Exam: Vitals:   08/08/21 1400 08/08/21 1636  BP: (!) 118/96 118/83  Pulse: (!) 105 96  Resp: 16 17  Temp: 98 F (36.7 C) 98.6 F (37 C)  SpO2: 99% 99%    General: Appears in no distress Cardiovascular: S1-S2, regular Respiratory: Clear to auscultation bilaterally  Discharge Instructions   Discharge Instructions     Discharge instructions   Complete by: As directed    Follow-up with your psychiatrist as outpatient for adjustment of medications   Increase activity slowly   Complete by: As directed       Allergies as of 08/08/2021   No Known Allergies      Medication List     STOP taking these medications    buPROPion 150 MG 12 hr tablet Commonly known as: WELLBUTRIN SR Replaced by: buPROPion 150  MG 24 hr tablet   ibuprofen 600 MG tablet Commonly known as: ADVIL       TAKE these medications    Abilify Maintena 400 MG Prsy prefilled syringe Generic drug: ARIPiprazole ER Inject 400 mg into the muscle every 28 (twenty-eight) days.   BC HEADACHE POWDER  PO Take 1 packet by mouth daily as needed (headache).   Biotin 10 MG Caps Take 10 mg by mouth daily.   buPROPion 150 MG 24 hr tablet Commonly known as: WELLBUTRIN XL Take 1 tablet (150 mg total) by mouth daily. Start taking on: August 09, 2021 Replaces: buPROPion 150 MG 12 hr tablet   desvenlafaxine 50 MG 24 hr tablet Commonly known as: PRISTIQ Take 50 mg by mouth daily.   lisdexamfetamine 60 MG capsule Commonly known as: VYVANSE Take 60 mg by mouth every morning.   Prevagen Extra Strength 20 MG Caps Generic drug: Apoaequorin Take 20 mg by mouth daily.   pyridOXINE 100 MG tablet Commonly known as: VITAMIN B-6 Take 100 mg by mouth daily.       No Known Allergies  Follow-up Information     Medicine, [facility] Follow up in 2 week(s).   Specialty: Family Medicine                 The results of significant diagnostics from this hospitalization (including imaging, microbiology, ancillary and laboratory) are listed below for reference.    Significant Diagnostic Studies: CT Head Wo Contrast  Result Date: 08/06/2021 CLINICAL DATA:  Mental status change.  Headache. EXAM: CT HEAD WITHOUT CONTRAST TECHNIQUE: Contiguous axial images were obtained from the base of the skull through the vertex without intravenous contrast. COMPARISON:  None. FINDINGS: Brain: No evidence of acute infarction, hemorrhage, hydrocephalus, extra-axial collection or mass lesion/mass effect. Vascular: No hyperdense vessel or unexpected calcification. Skull: Normal. Negative for fracture or focal lesion. Sinuses/Orbits: No acute finding. Other: None. IMPRESSION: No acute intracranial abnormalities. Normal brain. Electronically Signed   By: Signa Kell M.D.   On: 08/06/2021 18:40   MR BRAIN WO CONTRAST  Result Date: 08/07/2021 CLINICAL DATA:  Initial evaluation for mental status change, unknown cause. EXAM: MRI HEAD WITHOUT CONTRAST TECHNIQUE: Multiplanar, multiecho pulse  sequences of the brain and surrounding structures were obtained without intravenous contrast. COMPARISON:  Prior CT from 08/24/2020. FINDINGS: Brain: Cerebral volume within normal limits for patient age. Single few punctate subcentimeter foci of FLAIR hyperintensity noted involving the supratentorial cerebral white matter, nonspecific, but overall minimal in nature in felt to be within normal limits for age. No abnormal foci of restricted diffusion to suggest acute or subacute ischemia. Gray-white matter differentiation well maintained. No encephalomalacia to suggest chronic infarction. No foci of susceptibility artifact to suggest acute or chronic intracranial hemorrhage. No mass lesion, midline shift or mass effect. No hydrocephalus. No extra-axial fluid collection. Major dural sinuses are grossly patent. Pituitary gland and suprasellar region are normal. Midline structures intact and normal. Vascular: Major intracranial vascular flow voids well maintained and normal in appearance. Skull and upper cervical spine: Craniocervical junction normal. Visualized upper cervical spine within normal limits. Bone marrow signal intensity normal. No scalp soft tissue abnormality. Sinuses/Orbits: Globes and orbital soft tissues within normal limits. Paranasal sinuses are clear. No mastoid effusion. Inner ear structures normal. Other: None. IMPRESSION: Normal brain MRI.  No acute intracranial abnormality. Electronically Signed   By: Rise Mu M.D.   On: 08/07/2021 01:43   Overnight EEG with video  Result Date: 08/08/2021 Lindie Spruce  Val Eagle, MD     08/08/2021  9:42 AM Patient Name: Cheryl Acevedo MRN: [id number] Epilepsy Attending: Charlsie Quest Referring Physician/Provider: Jimmye Norman, NP Duration: 08/07/2021 1740 to 08/08/2021 0945 Patient history: 30 yo female with multiple psychiatric diagnoses who presented after seizure like activity yesterday and has had the same type spells at home. EEG to evaluate  for seizure Level of alertness: Awake, asleep AEDs during EEG study: None Technical aspects: This EEG study was done with scalp electrodes positioned according to the 10-20 International system of electrode placement. Electrical activity was acquired at a sampling rate of 500Hz  and reviewed with a high frequency filter of 70Hz  and a low frequency filter of 1Hz . EEG data were recorded continuously and digitally stored. Description: The posterior dominant rhythm consists of 9 Hz activity of moderate voltage (25-35 uV) seen predominantly in posterior head regions, symmetric and reactive to eye opening and eye closing. Sleep was characterized by vertex waves, sleep spindles (12 to 14 Hz), maximal frontocentral region. Hyperventilation and photic stimulation were not performed.   IMPRESSION: This study is within normal limits. No seizures or epileptiform discharges were seen throughout the recording.   ECHOCARDIOGRAM COMPLETE  Result Date: 08/07/2021    ECHOCARDIOGRAM REPORT   Patient Name:   Cheryl Acevedo Date of Exam: 08/07/2021 Medical Rec #:  10/07/2021      Height:       71.0 in Accession #:    Lise Auer     Weight:       189.6 lb Date of Birth:  [birth year]      BSA:          2.061 m Patient Age:    30 years       BP:           83/71 mmHg Patient Gender: F              HR:           56 bpm. Exam Location:  Inpatient Procedure: 2D Echo, Cardiac Doppler and Color Doppler Indications:    R55 Syncope  History:        Patient has no prior history of Echocardiogram examinations.  Sonographer:    [id number] Referring Phys: [phone] JARED M GARDNER IMPRESSIONS  1. Left ventricular ejection fraction, by estimation, is 60 to 65%. The left ventricle has normal function. The left ventricle has no regional wall motion abnormalities. Left ventricular diastolic parameters were normal.  2. Right ventricular systolic function is normal. The right ventricular size is normal.  3. The mitral valve is normal in  structure. No evidence of mitral valve regurgitation. No evidence of mitral stenosis.  4. The aortic valve is normal in structure. Aortic valve regurgitation is not visualized. No aortic stenosis is present.  5. The inferior vena cava is normal in size with greater than 50% respiratory variability, suggesting right atrial pressure of 3 mmHg. Comparison(s): No prior Echocardiogram. Conclusion(s)/Recommendation(s): Normal biventricular function without evidence of hemodynamically significant valvular heart disease. FINDINGS  Left Ventricle: Left ventricular ejection fraction, by estimation, is 60 to 65%. The left ventricle has normal function. The left ventricle has no regional wall motion abnormalities. The left ventricular internal cavity size was normal in size. There is  no left ventricular hypertrophy. Left ventricular diastolic parameters were normal. Right Ventricle: The right ventricular size is normal. No increase in right ventricular wall thickness. Right ventricular systolic function is normal. Left Atrium: Left atrial size was normal  in size. Right Atrium: Right atrial size was normal in size. Pericardium: There is no evidence of pericardial effusion. Mitral Valve: The mitral valve is normal in structure. No evidence of mitral valve regurgitation. No evidence of mitral valve stenosis. Tricuspid Valve: The tricuspid valve is normal in structure. Tricuspid valve regurgitation is trivial. No evidence of tricuspid stenosis. Aortic Valve: The aortic valve is normal in structure. Aortic valve regurgitation is not visualized. No aortic stenosis is present. Pulmonic Valve: The pulmonic valve was normal in structure. Pulmonic valve regurgitation is not visualized. No evidence of pulmonic stenosis. Aorta: The aortic root and ascending aorta are structurally normal, with no evidence of dilitation. Venous: The inferior vena cava is normal in size with greater than 50% respiratory variability, suggesting right atrial  pressure of 3 mmHg. IAS/Shunts: No atrial level shunt detected by color flow Doppler.  LEFT VENTRICLE PLAX 2D LVIDd:         4.90 cm     Diastology LVIDs:         3.00 cm     LV e' medial:    13.60 cm/s LV PW:         0.80 cm     LV E/e' medial:  5.7 LV IVS:        0.70 cm     LV e' lateral:   14.40 cm/s LVOT diam:     2.00 cm     LV E/e' lateral: 5.4 LV SV:         68 LV SV Index:   33 LVOT Area:     3.14 cm  LV Volumes (MOD) LV vol d, MOD A2C: 79.8 ml LV vol d, MOD A4C: 86.3 ml LV vol s, MOD A2C: 38.8 ml LV vol s, MOD A4C: 30.8 ml LV SV MOD A2C:     41.0 ml LV SV MOD A4C:     86.3 ml LV SV MOD BP:      49.6 ml RIGHT VENTRICLE TAPSE (M-mode): 2.4 cm LEFT ATRIUM             Index       RIGHT ATRIUM           Index LA diam:        2.50 cm 1.21 cm/m  RA Area:     14.30 cm LA Vol (A2C):   28.5 ml 13.83 ml/m RA Volume:   35.40 ml  17.17 ml/m LA Vol (A4C):   25.2 ml 12.23 ml/m LA Biplane Vol: 27.5 ml 13.34 ml/m  AORTIC VALVE LVOT Vmax:   102.00 cm/s LVOT Vmean:  68.600 cm/s LVOT VTI:    0.216 m  AORTA Ao Root diam: 2.50 cm Ao Asc diam:  2.70 cm MITRAL VALVE MV Area (PHT): 3.42 cm    SHUNTS MV Decel Time: 222 msec    Systemic VTI:  0.22 m MV E velocity: 77.60 cm/s  Systemic Diam: 2.00 cm MV A velocity: 45.00 cm/s MV E/A ratio:  1.72 Zoila Shutter MD Electronically signed by Zoila Shutter MD Signature Date/Time: 08/07/2021/10:04:21 AM    Final     Microbiology: Recent Results (from the past 240 hour(s))  Resp Panel by RT-PCR (Flu A&B, Covid) Nasopharyngeal Swab     Status: None   Collection Time: 08/06/21 10:01 PM   Specimen: Nasopharyngeal Swab; Nasopharyngeal(NP) swabs in vial transport medium  Result Value Ref Range Status   SARS Coronavirus 2 by RT PCR NEGATIVE NEGATIVE Final    Comment: (NOTE) SARS-CoV-2 target nucleic  acids are NOT DETECTED.  The SARS-CoV-2 RNA is generally detectable in upper respiratory specimens during the acute phase of infection. The lowest concentration of SARS-CoV-2 viral  copies this assay can detect is 138 copies/mL. A negative result does not preclude SARS-Cov-2 infection and should not be used as the sole basis for treatment or other patient management decisions. A negative result may occur with  improper specimen collection/handling, submission of specimen other than nasopharyngeal swab, presence of viral mutation(s) within the areas targeted by this assay, and inadequate number of viral copies(<138 copies/mL). A negative result must be combined with clinical observations, patient history, and epidemiological information. The expected result is Negative.  Fact Sheet for Patients:  BloggerCourse.com  Fact Sheet for Healthcare Providers:  SeriousBroker.it  This test is no t yet approved or cleared by the [place] FDA and  has been authorized for detection and/or diagnosis of SARS-CoV-2 by FDA under an Emergency Use Authorization (EUA). This EUA will remain  in effect (meaning this test can be used) for the duration of the COVID-19 declaration under Section 564(b)(1) of the Act, 21 U.S.C.section 360bbb-3(b)(1), unless the authorization is terminated  or revoked sooner.       Influenza A by PCR NEGATIVE NEGATIVE Final   Influenza B by PCR NEGATIVE NEGATIVE Final    Comment: (NOTE) The Xpert Xpress SARS-CoV-2/FLU/RSV plus assay is intended as an aid in the diagnosis of influenza from Nasopharyngeal swab specimens and should not be used as a sole basis for treatment. Nasal washings and aspirates are unacceptable for Xpert Xpress SARS-CoV-2/FLU/RSV testing.  Fact Sheet for Patients: BloggerCourse.com  Fact Sheet for Healthcare Providers: SeriousBroker.it  This test is not yet approved or cleared by the [place] FDA and has been authorized for detection and/or diagnosis of SARS-CoV-2 by FDA under an Emergency Use Authorization (EUA). This  EUA will remain in effect (meaning this test can be used) for the duration of the COVID-19 declaration under Section 564(b)(1) of the Act, 21 U.S.C. section 360bbb-3(b)(1), unless the authorization is terminated or revoked.  Performed at [facility], [address], [place], [place] [address]      Labs: Basic Metabolic Panel: Recent Labs  Lab 08/06/21 1732 08/06/21 1932 08/07/21 0351  NA 142 143 142  K 3.3* 3.3* 4.3  CL 106 108 109  CO2 27  --  26  GLUCOSE 115* 74 95  BUN 13 10 18   CREATININE 0.75 0.70 0.81  CALCIUM 9.3  --  9.5  MG 2.2  --   --    Liver Function Tests: Recent Labs  Lab 08/06/21 1732  AST 20  ALT 16  ALKPHOS 65  BILITOT 0.5  PROT 7.6  ALBUMIN 4.5   No results for input(s): LIPASE, AMYLASE in the last 168 hours. No results for input(s): AMMONIA in the last 168 hours. CBC: Recent Labs  Lab 08/06/21 1732 08/06/21 1932 08/07/21 0351  WBC 8.1  --  7.0  NEUTROABS 5.2  --   --   HGB 13.5 11.9* 12.2  HCT 41.1 35.0* 37.4  MCV 86.3  --  87.0  PLT 345  --  294   Cardiac Enzymes: No results for input(s): CKTOTAL, CKMB, CKMBINDEX, TROPONINI in the last 168 hours. BNP: BNP (last 3 results) No results for input(s): BNP in the last 8760 hours.  ProBNP (last 3 results) No results for input(s): PROBNP in the last 8760 hours.  CBG: No results for input(s): GLUCAP in the last 168 hours.  Signed:  Meredeth Ide MD.  [facility] 08/08/2021, 6:58 PM

## 2021-08-08 NOTE — Progress Notes (Signed)
LTM EEG leads checked for skin breakdown= none seen. Reapplied C4, Cz, F4, Fz, and PZ. Results pending.

## 2021-08-08 NOTE — Progress Notes (Addendum)
  Called by patient's RN that patient's mother wanted to talk to the neurologist, myself NP Kirby-Graham.  Met patient's mother in conference room with Dr. Curly Shores, NP Clance Boll  She was explained by Dr. Curly Shores that EEG and MRI were done which were unremarkable for seizure.  And this was more like a PNES.  Patient's mother was very upset with NP's interaction with patient and her family.  She said that she carries a gun and we should be grateful that she has self restraint given the stressor of situation.  Santiago Glad left the room, and me and Dr. Curly Shores, calmed her down.  Patient's mother wanted NP to apologize to her and her daughter.  I explained to patient's mother that we cannot force her to apologize that she had already left the conference room.  I told her that she could talk to patient advocate or file a formal complaint against NP if she is unhappy with her interaction.  I have already consulted psychiatrist  Patient's mother does not want to wait for psychiatry consultation.  She wants patient to be discharged home today.  I went and told the charge nurse regarding this conversation and that she said that she is carrying a gun.  Security was called by Merry Lofty , and they went and spoke to patient's family

## 2021-08-08 NOTE — Progress Notes (Signed)
[facility]  PROGRESS NOTE  Cheryl Acevedo RXV:[id number] DOB: June 08, 1991 DOA: 08/06/2021 PCP: Medicine, [facility]   Brief HPI:   30 year old female with past medical history of narcolepsy, cataplexy, major depressive disorder, schizoaffective disorder presented with recurrent episodes of altered mentation with no tonic-clonic movements, brief in duration with no postictal symptoms.  There was no associated loss of bladder or bowel incontinence or tongue biting.  These episodes have been occurring more frequently, had 1 episode 3 days before admission and another episode 1 day before admission around 7 times. Because of these episodes she was referred to get brain MRI which could be obtained as patient had episode just before imaging.  She was recently placed on bupropion for her psychiatric illness. Toxicology screen was positive for amphetamines Head CT was negative for acute changes Brain MRI was normal Neurology was consulted and patient was transferred to [facility] for further work-up. Patient had LTM EEG monitoring hookup, and was being monitored with no epileptiform discharges noted or seizures noted on the EEG recording.   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  They wanted to discuss with neurologist so neurologist NP Maren Reamer was called and I went to discuss the findings with Maren Reamer.  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  Patient got very upset and became violent, pulled out her EEG leads and ripped off her gown exposing herself.  RN was called to help, patient calmed down later.  RN helped her redress.   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 -Neurologist discussed with patient and her mother and explained that this is PNES -She will need adjustment of her psych medications -Patient and her mother does not want to stay in the hospital for psychiatric consultation and want to be discharged -We will discharge her today       Scheduled medications:    buPROPion  150 mg Oral Once   [START ON 08/09/2021] buPROPion  150 mg Oral Daily   desvenlafaxine  50 mg Oral Daily   enoxaparin (LOVENOX) injection  40 mg Subcutaneous Q24H   lisdexamfetamine  60 mg Oral Daily   pantoprazole  40 mg Oral Daily   pyridOXINE  100 mg Oral Daily     Data Reviewed:   CBG:  No results for input(s): GLUCAP in the last 168 hours.  SpO2: 99 %    Vitals:   08/08/21 0749 08/08/21 1159 08/08/21 1400 08/08/21 1636  BP: 114/74 114/80 (!) 118/96 118/83  Pulse: 86 86 (!) 105 96  Resp: 17 18 16 17   Temp: 98.2 F (36.8 C) 98.2 F (36.8 C) 98 F (36.7 C) 98.6 F (37 C)  TempSrc: Oral Oral Oral Oral  SpO2: 99% 99% 99% 99%  Weight:      Height:        No intake or output data in the 24 hours ending 08/08/21 1731  No intake/output data recorded.  Filed Weights   08/06/21 1649  Weight: 86 kg    Data Reviewed: Basic Metabolic Panel: Recent Labs  Lab 08/06/21 1732  08/06/21 1932 08/07/21 0351  NA 142 143 142  K 3.3* 3.3* 4.3  CL 106 108 109  CO2 27  --  26  GLUCOSE 115* 74 95  BUN 13 10 18   CREATININE 0.75 0.70 0.81  CALCIUM 9.3  --  9.5  MG 2.2  --   --    Liver Function Tests: Recent Labs  Lab 08/06/21 1732  AST 20  ALT 16  ALKPHOS 65  BILITOT 0.5  PROT 7.6  ALBUMIN 4.5   No results for input(s): LIPASE, AMYLASE in the last 168 hours. No results for input(s): AMMONIA in the last 168 hours. CBC: Recent Labs  Lab 08/06/21 1732 08/06/21 1932 08/07/21 0351  WBC 8.1  --  7.0  NEUTROABS  5.2  --   --   HGB 13.5 11.9* 12.2  HCT 41.1 35.0* 37.4  MCV 86.3  --  87.0  PLT 345  --  294   Cardiac Enzymes: No results for input(s): CKTOTAL, CKMB, CKMBINDEX, TROPONINI in the last 168 hours. BNP (last 3 results) No results for input(s): BNP in the last 8760 hours.  ProBNP (last 3 results) No results for input(s): PROBNP in the last 8760 hours.  CBG: No results for input(s): GLUCAP in the last 168 hours.     Radiology Reports  CT Head Wo Contrast  Result Date: 08/06/2021 CLINICAL DATA:  Mental status change.  Headache. EXAM: CT HEAD WITHOUT CONTRAST TECHNIQUE: Contiguous axial images were obtained from the base of the skull through the vertex without intravenous contrast. COMPARISON:  None. FINDINGS: Brain: No evidence of acute infarction, hemorrhage, hydrocephalus, extra-axial collection or mass lesion/mass effect. Vascular: No hyperdense vessel or unexpected calcification. Skull: Normal. Negative for fracture or focal lesion. Sinuses/Orbits: No acute finding. Other: None. IMPRESSION: No acute intracranial abnormalities. Normal brain. Electronically Signed   By: 10/06/2021 M.D.   On: 08/06/2021 18:40   MR BRAIN WO CONTRAST  Result Date: 08/07/2021 CLINICAL DATA:  Initial evaluation for mental status change, unknown cause. EXAM: MRI HEAD WITHOUT CONTRAST TECHNIQUE: Multiplanar, multiecho pulse sequences of the brain and surrounding structures were obtained without intravenous contrast. COMPARISON:  Prior CT from 08/24/2020. FINDINGS: Brain: Cerebral volume within normal limits for patient age. Single few punctate subcentimeter foci of FLAIR hyperintensity noted involving the supratentorial cerebral white matter, nonspecific, but overall minimal in nature in felt to be within normal limits for age. No abnormal foci of restricted diffusion to suggest acute or subacute ischemia. Gray-white matter differentiation well maintained. No encephalomalacia to suggest chronic infarction. No  foci of susceptibility artifact to suggest acute or chronic intracranial hemorrhage. No mass lesion, midline shift or mass effect. No hydrocephalus. No extra-axial fluid collection. Major dural sinuses are grossly patent. Pituitary gland and suprasellar region are normal. Midline structures intact and normal. Vascular: Major intracranial vascular flow voids well maintained and normal in appearance. Skull and upper cervical spine: Craniocervical junction normal. Visualized upper cervical spine within normal limits. Bone marrow signal intensity normal. No scalp soft tissue abnormality. Sinuses/Orbits: Globes and orbital soft tissues within normal limits. Paranasal sinuses are clear. No mastoid effusion. Inner ear structures normal. Other: None. IMPRESSION: Normal brain MRI.  No acute intracranial abnormality. Electronically Signed   By: 08/26/2020 M.D.   On: 08/07/2021 01:43   Overnight EEG with video  Result Date: 08/08/2021 10/08/2021, MD     08/08/2021  9:42 AM Patient Name: HERTHA GERGEN MRN: Lise Auer Epilepsy Attending: [id number] Referring Physician/Provider: Charlsie Quest  Kirby-Graham, NP Duration: 08/07/2021 1740 to 08/08/2021 0945 Patient history: 30 yo female with multiple psychiatric diagnoses who presented after seizure like activity yesterday and has had the same type spells at home. EEG to evaluate for seizure Level of alertness: Awake, asleep AEDs during EEG study: None Technical aspects: This EEG study was done with scalp electrodes positioned according to the 10-20 International system of electrode placement. Electrical activity was acquired at a sampling rate of 500Hz  and reviewed with a high frequency filter of 70Hz  and a low frequency filter of 1Hz . EEG data were recorded continuously and digitally stored. Description: The posterior dominant rhythm consists of 9 Hz activity of moderate voltage (25-35 uV) seen predominantly in posterior head regions, symmetric and reactive to eye  opening and eye closing. Sleep was characterized by vertex waves, sleep spindles (12 to 14 Hz), maximal frontocentral region. Hyperventilation and photic stimulation were not performed.   IMPRESSION: This study is within normal limits. No seizures or epileptiform discharges were seen throughout the recording.   ECHOCARDIOGRAM COMPLETE  Result Date: 08/07/2021    ECHOCARDIOGRAM REPORT   Patient Name:   DEVEN AUDI Date of Exam: 08/07/2021 Medical Rec #:  10/07/2021      Height:       71.0 in Accession #:    [id number] Auer     Weight:       189.6 lb Date of Birth:  1991/08/26      BSA:          2.061 m Patient Age:    30 years       BP:           83/71 mmHg Patient Gender: F              HR:           56 bpm. Exam Location:  Inpatient Procedure: 2D Echo, Cardiac Doppler and Color Doppler Indications:    R55 Syncope  History:        Patient has no prior history of Echocardiogram examinations.  Sonographer:    [id number] RDCS Referring Phys: [phone] JARED M GARDNER IMPRESSIONS  1. Left ventricular ejection fraction, by estimation, is 60 to 65%. The left ventricle has normal function. The left ventricle has no regional wall motion abnormalities. Left ventricular diastolic parameters were normal.  2. Right ventricular systolic function is normal. The right ventricular size is normal.  3. The mitral valve is normal in structure. No evidence of mitral valve regurgitation. No evidence of mitral stenosis.  4. The aortic valve is normal in structure. Aortic valve regurgitation is not visualized. No aortic stenosis is present.  5. The inferior vena cava is normal in size with greater than 50% respiratory variability, suggesting right atrial pressure of 3 mmHg. Comparison(s): No prior Echocardiogram. Conclusion(s)/Recommendation(s): Normal biventricular function without evidence of hemodynamically significant valvular heart disease. FINDINGS  Left Ventricle: Left ventricular ejection fraction, by estimation,  is 60 to 65%. The left ventricle has normal function. The left ventricle has no regional wall motion abnormalities. The left ventricular internal cavity size was normal in size. There is  no left ventricular hypertrophy. Left ventricular diastolic parameters were normal. Right Ventricle: The right ventricular size is normal. No increase in right ventricular wall thickness. Right ventricular systolic function is normal. Left Atrium: Left atrial size was normal in size. Right Atrium: Right atrial size was normal in size. Pericardium: There is no evidence of pericardial effusion. Mitral Valve: The mitral valve is  normal in structure. No evidence of mitral valve regurgitation. No evidence of mitral valve stenosis. Tricuspid Valve: The tricuspid valve is normal in structure. Tricuspid valve regurgitation is trivial. No evidence of tricuspid stenosis. Aortic Valve: The aortic valve is normal in structure. Aortic valve regurgitation is not visualized. No aortic stenosis is present. Pulmonic Valve: The pulmonic valve was normal in structure. Pulmonic valve regurgitation is not visualized. No evidence of pulmonic stenosis. Aorta: The aortic root and ascending aorta are structurally normal, with no evidence of dilitation. Venous: The inferior vena cava is normal in size with greater than 50% respiratory variability, suggesting right atrial pressure of 3 mmHg. IAS/Shunts: No atrial level shunt detected by color flow Doppler.  LEFT VENTRICLE PLAX 2D LVIDd:         4.90 cm     Diastology LVIDs:         3.00 cm     LV e' medial:    13.60 cm/s LV PW:         0.80 cm     LV E/e' medial:  5.7 LV IVS:        0.70 cm     LV e' lateral:   14.40 cm/s LVOT diam:     2.00 cm     LV E/e' lateral: 5.4 LV SV:         68 LV SV Index:   33 LVOT Area:     3.14 cm  LV Volumes (MOD) LV vol d, MOD A2C: 79.8 ml LV vol d, MOD A4C: 86.3 ml LV vol s, MOD A2C: 38.8 ml LV vol s, MOD A4C: 30.8 ml LV SV MOD A2C:     41.0 ml LV SV MOD A4C:     86.3 ml LV  SV MOD BP:      49.6 ml RIGHT VENTRICLE TAPSE (M-mode): 2.4 cm LEFT ATRIUM             Index       RIGHT ATRIUM           Index LA diam:        2.50 cm 1.21 cm/m  RA Area:     14.30 cm LA Vol (A2C):   28.5 ml 13.83 ml/m RA Volume:   35.40 ml  17.17 ml/m LA Vol (A4C):   25.2 ml 12.23 ml/m LA Biplane Vol: 27.5 ml 13.34 ml/m  AORTIC VALVE LVOT Vmax:   102.00 cm/s LVOT Vmean:  68.600 cm/s LVOT VTI:    0.216 m  AORTA Ao Root diam: 2.50 cm Ao Asc diam:  2.70 cm MITRAL VALVE MV Area (PHT): 3.42 cm    SHUNTS MV Decel Time: 222 msec    Systemic VTI:  0.22 m MV E velocity: 77.60 cm/s  Systemic Diam: 2.00 cm MV A velocity: 45.00 cm/s MV E/A ratio:  1.72 Zoila Shutter MD Electronically signed by Zoila Shutter MD Signature Date/Time: 08/07/2021/10:04:21 AM    Final        Antibiotics: Anti-infectives (From admission, onward)    None          Objective    Physical Examination:   General-appears in no acute distress Heart-S1-S2, regular, no murmur auscultated Lungs-clear to auscultation bilaterally, no wheezing or crackles auscultated Abdomen-soft, nontender, no organomegaly Extremities-no edema in the lower extremities Neuro-alert, oriented x3, no focal deficit noted  COVID-19 Labs  No results for input(s): DDIMER, FERRITIN, LDH, CRP in the last 72 hours.  Lab Results  Component Value Date   SARSCOV2NAA  NEGATIVE 08/06/2021   SARSCOV2NAA NEGATIVE 03/26/2020            Recent Results (from the past 240 hour(s))  Resp Panel by RT-PCR (Flu A&B, Covid) Nasopharyngeal Swab     Status: None   Collection Time: 08/06/21 10:01 PM   Specimen: Nasopharyngeal Swab; Nasopharyngeal(NP) swabs in vial transport medium  Result Value Ref Range Status   SARS Coronavirus 2 by RT PCR NEGATIVE NEGATIVE Final    Comment: (NOTE) SARS-CoV-2 target nucleic acids are NOT DETECTED.  The SARS-CoV-2 RNA is generally detectable in upper respiratory specimens during the acute phase of infection. The  lowest concentration of SARS-CoV-2 viral copies this assay can detect is 138 copies/mL. A negative result does not preclude SARS-Cov-2 infection and should not be used as the sole basis for treatment or other patient management decisions. A negative result may occur with  improper specimen collection/handling, submission of specimen other than nasopharyngeal swab, presence of viral mutation(s) within the areas targeted by this assay, and inadequate number of viral copies(<138 copies/mL). A negative result must be combined with clinical observations, patient history, and epidemiological information. The expected result is Negative.  Fact Sheet for Patients:  BloggerCourse.com  Fact Sheet for Healthcare Providers:  SeriousBroker.it  This test is no t yet approved or cleared by the [place] FDA and  has been authorized for detection and/or diagnosis of SARS-CoV-2 by FDA under an Emergency Use Authorization (EUA). This EUA will remain  in effect (meaning this test can be used) for the duration of the COVID-19 declaration under Section 564(b)(1) of the Act, 21 U.S.C.section 360bbb-3(b)(1), unless the authorization is terminated  or revoked sooner.       Influenza A by PCR NEGATIVE NEGATIVE Final   Influenza B by PCR NEGATIVE NEGATIVE Final    Comment: (NOTE) The Xpert Xpress SARS-CoV-2/FLU/RSV plus assay is intended as an aid in the diagnosis of influenza from Nasopharyngeal swab specimens and should not be used as a sole basis for treatment. Nasal washings and aspirates are unacceptable for Xpert Xpress SARS-CoV-2/FLU/RSV testing.  Fact Sheet for Patients: BloggerCourse.com  Fact Sheet for Healthcare Providers: SeriousBroker.it  This test is not yet approved or cleared by the [place] FDA and has been authorized for detection and/or diagnosis of SARS-CoV-2 by FDA under  an Emergency Use Authorization (EUA). This EUA will remain in effect (meaning this test can be used) for the duration of the COVID-19 declaration under Section 564(b)(1) of the Act, 21 U.S.C. section 360bbb-3(b)(1), unless the authorization is terminated or revoked.  Performed at [facility], [address], [place], [place] [address]     Meredeth Ide   [facility] If 7PM-7AM, please contact night-coverage at [email or web address], Office  [phone]   08/08/2021, 5:31 PM  LOS: 1 day

## 2021-08-08 NOTE — Plan of Care (Signed)

## 2021-08-08 NOTE — Progress Notes (Signed)
1145 patient safely sat herself on floor because she stated she "felt more comfortable" and was "concerned about having an episode and falling from my bed"   1155 EEG called and said patient having episode. Patient lying on right side on floor with her head on a pillow, crying, moaning loudly, mouth open and when asked if she was able to close her mouth she was able to shake her head no, no gaze preference was observed and when RN talked to her she was able to look over at RN. After the episode which lasted approximately 2 min, patient sat up on floor. Patient A+Ox4 and was transferred back to bed. Patient stated she was experiencing extreme thirst, water was given. Neuro NP notified.

## 2021-08-09 NOTE — Consult Note (Signed)
Brief Psychiatry Consult Note  The patient was last seen by the psychiatry service on 08/08/21 in late afternoon. Interim documentation by primary team and nursing staff has been reviewed. We discussed with primary team that we would see pt today 10/7. At this time, patient has been discharged before we could see them.   [name] A Cheryl Acevedo

## 2023-01-05 ENCOUNTER — Emergency Department
Admission: EM | Admit: 2023-01-05 | Discharge: 2023-01-05 | Disposition: A | Discharge status: Home / Self Care | Payer: Medicare Other | Attending: Emergency Medicine | Admitting: Emergency Medicine

## 2023-01-05 ENCOUNTER — Other Ambulatory Visit: Payer: Self-pay

## 2023-01-05 DIAGNOSIS — R5600 Simple febrile convulsions: Secondary | ICD-10-CM | POA: Diagnosis present

## 2023-01-05 DIAGNOSIS — R569 Unspecified convulsions: Secondary | ICD-10-CM

## 2023-01-05 LAB — COMPREHENSIVE METABOLIC PANEL
ALT: 18 U/L (ref 0–44)
AST: 17 U/L (ref 15–41)
Albumin: 4.1 g/dL (ref 3.5–5.0)
Alkaline Phosphatase: 42 U/L (ref 38–126)
Anion gap: 9 (ref 5–15)
BUN: 17 mg/dL (ref 6–20)
CO2: 23 mmol/L (ref 22–32)
Calcium: 9.1 mg/dL (ref 8.9–10.3)
Chloride: 106 mmol/L (ref 98–111)
Creatinine, Ser: 0.75 mg/dL (ref 0.44–1.00)
GFR, Estimated: >60 mL/min (ref >60)
Glucose, Bld: 95 mg/dL (ref 70–99)
Potassium: 3.5 mmol/L (ref 3.5–5.1)
Sodium: 138 mmol/L (ref 135–145)
Total Bilirubin: 0.4 mg/dL (ref 0.3–1.2)
Total Protein: 6.9 g/dL (ref 6.5–8.1)

## 2023-01-05 LAB — CBC WITH DIFFERENTIAL/PLATELET
Abs Immature Granulocytes: 0.02 10*3/uL (ref 0.00–0.07)
Basophils Absolute: 0 10*3/uL (ref 0.0–0.1)
Basophils Relative: 0 %
Eosinophils Absolute: 0.1 10*3/uL (ref 0.0–0.5)
Eosinophils Relative: 1 %
HCT: 37.5 % (ref 36.0–46.0)
Hemoglobin: 12.3 g/dL (ref 12.0–15.0)
Immature Granulocytes: 0 %
Lymphocytes Relative: 32 %
Lymphs Abs: 2.1 10*3/uL (ref 0.7–4.0)
MCH: 27.6 pg (ref 26.0–34.0)
MCHC: 32.8 g/dL (ref 30.0–36.0)
MCV: 84.3 fL (ref 80.0–100.0)
Monocytes Absolute: 0.5 10*3/uL (ref 0.1–1.0)
Monocytes Relative: 8 %
Neutro Abs: 3.9 10*3/uL (ref 1.7–7.7)
Neutrophils Relative %: 59 %
Platelets: 291 10*3/uL (ref 150–400)
RBC: 4.45 MIL/uL (ref 3.87–5.11)
RDW: 14 % (ref 11.5–15.5)
WBC: 6.7 10*3/uL (ref 4.0–10.5)
nRBC: 0 % (ref 0.0–0.2)

## 2023-01-05 LAB — POC URINE PREG, ED: Preg Test, Ur: NEGATIVE

## 2023-01-05 MED ORDER — LAMOTRIGINE 25 MG PO TABS
50.0000 mg | ORAL_TABLET | Freq: Once | ORAL | Status: AC
  Administered 2023-01-05: 50 mg via ORAL
  Filled 2023-01-05: qty 2

## 2023-01-05 NOTE — Discharge Instructions (Addendum)
Please continue to take your antiepileptic medications as prescribed.  Please call your neurologist today or tomorrow to discuss your breakthrough seizure.  If you have recurrent breakthrough seizures please return to the emergency department.

## 2023-01-05 NOTE — ED Notes (Signed)
Seizure pads placed on stretcher

## 2023-01-05 NOTE — ED Triage Notes (Signed)
Pt arrives by [facility] from surgery center for several back to back seizures lasting 4-5 minutes each.  P was with daughter who is having dental procedure and thinks it was triggered by high pitch noise and bright lights. She has hx of seizures, medications recently changed.  '5mg'$  versed given at 1119 via ems  No witnessed seizures en route. EMS stated HR goes up to 140-150 before seizure.  VSS otherwise

## 2023-01-05 NOTE — ED Provider Notes (Signed)
[facility] Provider Note    Event Date/Time   First MD Initiated Contact with Patient 01/05/23 1205     (approximate)   History   Seizures   HPI  Cheryl Acevedo is a 32 y.o. female past medical history of bipolar disorder, OCD, narcolepsy and cataplexy and frontal lobe epilepsy who presents after seizure.  Patient was an outpatient surgical center with her daughter when in the recovery room her daughter was yelling and it was somewhat high-pitched and this triggered a seizure.  She typically has aura of some paresthesias on her face prior to having a seizure.  Per triage note patient had about 4 to 5-minute seizure received 5 mg of Versed with EMS.  Currently patient says she feels fatigued but has no other complaints denies headache numbness tingling weakness.  Tells me her last seizure was about 2 weeks ago.  She is currently transitioning from Topamax to lamotrigine and is on 50 mg daily with goal to eventually be at 100 mg.  She also takes  Patient follows with neurologist in [place] through [facility].  Recent illnesses fevers chills cough urinary symptoms vomiting diarrhea.  Has been compliant with her medication.  Did not take the Lamictal this morning however.     Past Medical History:  Diagnosis Date   ACL (anterior cruciate ligament) tear    left   Anxiety    Bipolar disorder (HCC)    Depression    Obsessive compulsive disorder    Paranoia ([place])    S/P tubal ligation 03/26/2020   SVD (spontaneous vaginal delivery) 03/26/2020    Patient Active Problem List   Diagnosis Date Noted   Altered mental status 08/07/2021   Narcolepsy and cataplexy 08/06/2021   Labor and delivery indication for care or intervention 03/26/2020   SVD (spontaneous vaginal delivery) 03/26/2020   S/P tubal ligation 03/26/2020   Normal labor 04/22/2017   Postpartum care following vaginal delivery 04/22/2017   Schizoaffective disorder, bipolar type ([place]) 02/20/2012    Major depressive disorder, recurrent ([place]) 02/17/2012   OCD (obsessive compulsive disorder) 02/17/2012   Generalized anxiety disorder 02/17/2012     Physical Exam  Triage Vital Signs: ED Triage Vitals  Enc Vitals Group     BP 01/05/23 1158 119/86     Pulse Rate 01/05/23 1158 74     Resp 01/05/23 1158 18     Temp 01/05/23 1155 98.2 F (36.8 C)     Temp Source 01/05/23 1155 Oral     SpO2 01/05/23 1152 98 %     Weight 01/05/23 1155 174 lb (78.9 kg)     Height 01/05/23 1155 '5\' 11"'$  (1.803 m)     Head Circumference --      Peak Flow --      Pain Score 01/05/23 1155 0     Pain Loc --      Pain Edu? --      Excl. in [place]? --     Most recent vital signs: Vitals:   01/05/23 1400 01/05/23 1404  BP: 116/72   Pulse: 80   Resp: (!) 21   Temp:  98.2 F (36.8 C)  SpO2: 100%      General: Awake, no distress.  CV:  Good peripheral perfusion.  Resp:  Normal effort.  Abd:  No distention.  Neuro:             Awake, Alert, Oriented x 3  Other:  Aox3, nml speech  PERRL, EOMI, face  symmetric, nml tongue movement  5/5 strength in the BL upper and lower extremities  Sensation grossly intact in the BL upper and lower extremities  Finger-nose-finger intact BL   ED Results / Procedures / Treatments  Labs (all labs ordered are listed, but only abnormal results are displayed) Labs Reviewed  COMPREHENSIVE METABOLIC PANEL  CBC WITH DIFFERENTIAL/PLATELET  POC URINE PREG, ED     EKG  EKG interpretation performed by myself: NSR, nml axis, nml intervals, no acute ischemic changes    RADIOLOGY    PROCEDURES:  Critical Care performed: No  .1-3 Lead EKG Interpretation  Performed by: Rada Hay, MD Authorized by: Rada Hay, MD     Interpretation: normal     ECG rate assessment: normal     Rhythm: sinus rhythm     Ectopy: none     Conduction: normal     The patient is on the cardiac monitor to evaluate for evidence of arrhythmia and/or significant heart rate  changes.   MEDICATIONS ORDERED IN ED: Medications  lamoTRIgine (LAMICTAL) tablet 50 mg (50 mg Oral Given 01/05/23 1240)     IMPRESSION / MDM / ASSESSMENT AND PLAN / ED COURSE  I reviewed the triage vital signs and the nursing notes.                              Patient's presentation is most consistent with acute complicated illness / injury requiring diagnostic workup.  Differential diagnosis includes, but is not limited to, breakthrough seizure, electrode abnormality, pregnancy, medication noncompliance, breakthrough seizure secondary to medication adjustment, pseudoseizure  Patient is a 32 year old female with frontal lobe epilepsy and also PNES who presents after a seizure.  She was an outpatient surgical center with her daughter when her daughter screaming seem to trigger a seizure.  Did receive Versed with EMS apparently seizure lasted for about 4 to 5 minutes.  Patient did have her typical aura which is facial numbness.  She is denying any neurologic complaints currently denies recent illnesses have been compliant with her medication although did not take her Lamictal this morning.  Patient was recently switched from Topamax to Lamictal for seizures and she is at 50 mg twice daily with eventually being titrated to 100 mg.  She sees neurology in [place].  Patient's vital signs are reassuring neurologic exam is nonfocal.  Obtained CBC CMP to screen for any lab abnormalities that could lower seizure threshold these are all reassuring.  Pregnancy test negative.  Patient back to baseline.  Her last breakthrough seizure was about 2 weeks ago.  Says that prior to this was having much more frequent breakthrough seizures.  Given she is back to baseline with nonfocal neurologic exam I think that she can safely be discharged.  I suspect that this breakthrough seizure and this in the setting of her medications being adjusted.  I did give her her dose of a.m. Lamictal.  Discussed return precautions.        FINAL CLINICAL IMPRESSION(S) / ED DIAGNOSES   Final diagnoses:  Seizure ([place])     Rx / DC Orders   ED Discharge Orders     None        Note:  This document was prepared using Dragon voice recognition software and may include unintentional dictation errors.   Rada Hay, MD 01/05/23 [phone]

## 2024-10-04 ENCOUNTER — Other Ambulatory Visit: Payer: Self-pay | Admitting: Neurosurgery

## 2024-10-14 NOTE — Progress Notes (Signed)
 Surgical Instructions   Your procedure is scheduled on October 20, 2024. Report to [facility] Main Entrance A at 10:30 A.M., then check in with the Admitting office. Any questions or running late day of surgery: call [phone]  Questions prior to your surgery date: call [phone], Monday-Friday, 8am-4pm. If you experience any cold or flu symptoms such as cough, fever, chills, shortness of breath, etc. between now and your scheduled surgery, please notify us  at the above number.     Remember:  Do not eat or drink after midnight the night before your surgery    Take these medicines the morning of surgery with A SIP OF WATER  buPROPion  (WELLBUTRIN  XL)  desvenlafaxine  (PRISTIQ )  lamoTRIgine  (LAMICTAL )  sertraline  (ZOLOFT    May take these medicines IF NEEDED: VALTOCO     One week prior to surgery, STOP taking any Aspirin (unless otherwise instructed by your surgeon) Aleve, Naproxen, Ibuprofen , Motrin , Advil , Goody's, BC's, all herbal medications, fish oil, and non-prescription vitamins.                     Do NOT Smoke (Tobacco/Vaping) for 24 hours prior to your procedure.  If you use a CPAP at night, you may bring your mask/headgear for your overnight stay.   You will be asked to remove any contacts, glasses, piercing's, hearing aid's, dentures/partials prior to surgery. Please bring cases for these items if needed.    Patients discharged the day of surgery will not be allowed to drive home, and someone needs to stay with them for 24 hours.  SURGICAL WAITING ROOM VISITATION Patients may have no more than 2 support people in the waiting area - these visitors may rotate.   Pre-op nurse will coordinate an appropriate time for 1 ADULT support person, who may not rotate, to accompany patient in pre-op.  Children under the age of 100 must have an adult with them who is not the patient and must remain in the main waiting area with an adult.  If the patient needs to stay at the  hospital during part of their recovery, the visitor guidelines for inpatient rooms apply.  Please refer to the [facility] website for the visitor guidelines for any additional information.   If you received a COVID test during your pre-op visit  it is requested that you wear a mask when out in public, stay away from anyone that may not be feeling well and notify your surgeon if you develop symptoms. If you have been in contact with anyone that has tested positive in the last 10 days please notify you surgeon.      Pre-operative CHG Bathing Instructions   You can play a key role in reducing the risk of infection after surgery. Your skin needs to be as free of germs as possible. You can reduce the number of germs on your skin by washing with CHG (chlorhexidine gluconate) soap before surgery. CHG is an antiseptic soap that kills germs and continues to kill germs even after washing.   DO NOT use if you have an allergy to chlorhexidine/CHG or antibacterial soaps. If your skin becomes reddened or irritated, stop using the CHG and notify one of our RNs at [phone].              TAKE A SHOWER THE NIGHT BEFORE SURGERY   Please keep in mind the following:  DO NOT shave, including legs and underarms, 48 hours prior to surgery.   You may shave your face  before/day of surgery.  Place clean sheets on your bed the night before surgery Use a clean washcloth (not used since being washed) for shower. DO NOT sleep with pet's night before surgery.  CHG Shower Instructions:  Wash your face and private area with normal soap. If you choose to wash your hair, wash first with your normal shampoo.  After you use shampoo/soap, rinse your hair and body thoroughly to remove shampoo/soap residue.  Turn the water OFF and apply half the bottle of CHG soap to a CLEAN washcloth.  Apply CHG soap ONLY FROM YOUR NECK DOWN TO YOUR TOES (washing for 3-5 minutes)  DO NOT use CHG soap on face, private areas, open wounds,  or sores.  Pay special attention to the area where your surgery is being performed.  If you are having back surgery, having someone wash your back for you may be helpful. Wait 2 minutes after CHG soap is applied, then you may rinse off the CHG soap.  Pat dry with a clean towel  Put on clean pajamas    Additional instructions for the day of surgery: If you choose, you may shower the morning of surgery with an antibacterial soap.  DO NOT APPLY any lotions, deodorants, cologne, or perfumes.   Do not wear jewelry or makeup Do not wear nail polish, gel polish, artificial nails, or any other type of covering on natural nails (fingers and toes) Do not bring valuables to the hospital. [facility] is not responsible for valuables/personal belongings. Put on clean/comfortable clothes.  Please brush your teeth.  Ask your nurse before applying any prescription medications to the skin.

## 2024-10-17 ENCOUNTER — Other Ambulatory Visit: Payer: Self-pay

## 2024-10-17 ENCOUNTER — Inpatient Hospital Stay (HOSPITAL_COMMUNITY)
Admission: RE | Admit: 2024-10-17 | Discharge: 2024-10-17 | Disposition: A | Source: Ambulatory Visit | Attending: Neurosurgery

## 2024-10-17 ENCOUNTER — Encounter (HOSPITAL_COMMUNITY): Payer: Self-pay

## 2024-10-17 DIAGNOSIS — I442 Atrioventricular block, complete: Secondary | ICD-10-CM | POA: Diagnosis not present

## 2024-10-17 DIAGNOSIS — I4892 Unspecified atrial flutter: Secondary | ICD-10-CM | POA: Diagnosis not present

## 2024-10-17 DIAGNOSIS — I4891 Unspecified atrial fibrillation: Secondary | ICD-10-CM | POA: Diagnosis not present

## 2024-10-17 DIAGNOSIS — I4510 Unspecified right bundle-branch block: Secondary | ICD-10-CM | POA: Insufficient documentation

## 2024-10-17 DIAGNOSIS — I472 Ventricular tachycardia, unspecified: Secondary | ICD-10-CM | POA: Insufficient documentation

## 2024-10-17 DIAGNOSIS — F319 Bipolar disorder, unspecified: Secondary | ICD-10-CM | POA: Insufficient documentation

## 2024-10-17 DIAGNOSIS — I493 Ventricular premature depolarization: Secondary | ICD-10-CM | POA: Diagnosis not present

## 2024-10-17 DIAGNOSIS — G40219 Localization-related (focal) (partial) symptomatic epilepsy and epileptic syndromes with complex partial seizures, intractable, without status epilepticus: Secondary | ICD-10-CM | POA: Diagnosis not present

## 2024-10-17 DIAGNOSIS — Z01818 Encounter for other preprocedural examination: Secondary | ICD-10-CM | POA: Diagnosis present

## 2024-10-17 DIAGNOSIS — F419 Anxiety disorder, unspecified: Secondary | ICD-10-CM | POA: Diagnosis not present

## 2024-10-17 DIAGNOSIS — F429 Obsessive-compulsive disorder, unspecified: Secondary | ICD-10-CM | POA: Diagnosis not present

## 2024-10-17 DIAGNOSIS — R531 Weakness: Secondary | ICD-10-CM | POA: Diagnosis not present

## 2024-10-17 NOTE — Progress Notes (Signed)
 PCP - [facility] Cardiologist - Nile Nanny, MD, [facility] [place] Neurologist - Glory Cress MD  PPM/ICD - denies Device Orders -  Rep Notified -   Chest x-ray -07/27/24  EKG - 09/24/24 [facility]- requested Stress Test - denies ECHO - 09/18/23 [facility] Cardiac cath - denies  Sleep Study - 10/29/20-+ narcolepsy with cataplexy CPAP - no  Fasting Blood Sugar - na Checks Blood Sugar _____ times a day  Last dose of GLP1 agonist-  na GLP1 instructions: na  Blood Thinner Instructions:na Aspirin Instructions:na  ERAS Protcol -NPO PRE-SURGERY Ensure or G2-   COVID TEST- na   Anesthesia review: Yes- HX daily seizures,POTS syndrome  Patient denies shortness of breath, fever, cough and chest pain at PAT appointment   All instructions explained to the patient, with a verbal understanding of the material. Patient agrees to go over the instructions while at home for a better understanding. Patient also instructed to self quarantine after being tested for COVID-19. The opportunity to ask questions was provided.

## 2024-10-18 NOTE — Anesthesia Preprocedure Evaluation (Signed)
 Anesthesia Evaluation  Patient identified by MRN, date of birth, ID band Patient awake    Reviewed: Allergy & Precautions, NPO status , Patient's Chart, lab work & pertinent test results  Airway Mallampati: II  TM Distance: >3 FB Neck ROM: Full    Dental no notable dental hx.    Pulmonary neg pulmonary ROS   Pulmonary exam normal        Cardiovascular negative cardio ROS  Rhythm:Regular Rate:Normal     Neuro/Psych  Headaches, Seizures -,   Anxiety Depression Bipolar Disorder Schizophrenia     GI/Hepatic negative GI ROS, Neg liver ROS,,,  Endo/Other  negative endocrine ROS    Renal/GU negative Renal ROS  negative genitourinary   Musculoskeletal negative musculoskeletal ROS (+)    Abdominal Normal abdominal exam  (+)   Peds  Hematology  (+) Blood dyscrasia, anemia Lab Results      Component                Value               Date                      WBC                      6.7                 01/05/2023                HGB                      12.3                01/05/2023                HCT                      37.5                01/05/2023                MCV                      84.3                01/05/2023                PLT                      291                 01/05/2023             Lab Results      Component                Value               Date                      NA                       138                 01/05/2023                K  3.5                 01/05/2023                CO2                      23                  01/05/2023                GLUCOSE                  95                  01/05/2023                BUN                      17                  01/05/2023                CREATININE               0.75                01/05/2023                CALCIUM                  9.1                 01/05/2023                GFRNONAA                 >60                  01/05/2023              Anesthesia Other Findings   Reproductive/Obstetrics                              Anesthesia Physical Anesthesia Plan  ASA: 3  Anesthesia Plan: General   Post-op Pain Management: Tylenol  PO (pre-op)* and Celebrex  PO (pre-op)*   Induction: Intravenous  PONV Risk Score and Plan: 3 and Ondansetron , Dexamethasone , Midazolam  and Treatment may vary due to age or medical condition  Airway Management Planned: Mask and Oral ETT  Additional Equipment: None  Intra-op Plan:   Post-operative Plan: Extubation in OR  Informed Consent: I have reviewed the patients History and Physical, chart, labs and discussed the procedure including the risks, benefits and alternatives for the proposed anesthesia with the patient or authorized representative who has indicated his/her understanding and acceptance.     Dental advisory given  Plan Discussed with: CRNA  Anesthesia Plan Comments: (PAT note written 10/18/2024 by Inaaya Vellucci, PA-C.  )         Anesthesia Quick Evaluation

## 2024-10-18 NOTE — Progress Notes (Signed)
 Anesthesia Chart Review:  Case: [id number] Date/Time: 10/20/24 1215   Procedure: VAGAL NERVE STIMULATOR IMPLANT - placement of VNS   Anesthesia type: General   Diagnosis: Partial symptomatic epilepsy with complex partial seizures, intractable, without status epilepticus (HCC) [G40.219]   Pre-op diagnosis: Partial symptomatic epilepsy with complex partial seizures, intractable, without status epilepticus   [facility] ROOM 20 / [facility]   Surgeons: Lanis Pupa, MD       DISCUSSION: Patient is a 33 year old female scheduled for the above procedure.  History includes never smoker, Bipolar disorder, OCD, anxiety, seizure disorder (including non-epileptic seizures 07/16/2023 [facility]), POTS, anemia. [facility] admission 09/25/2024 following ED evaluation for attempted intentional OD with meclizine and nasal diazepam.   I do not have available records from her current neurologist Dr. Euna, but according to [facility] evaluation on 07/16/2203, seizure like episodes include episodes of weakness and collpasing, sudden flexion of her right hand, or episodes of crying, laughing or twitching. Brain MRI was unremarkable. She had an EMU admission at [facility] in May 2024, where she had three of her typical episodes, but no EEG correlation was found and was diagnosed with non-epileptic seizures. Per neurosurgery evaluation on 10/03/2024, she reports having quit thrombi complex partial seizures including lip-smacking and automatisms. Symptoms occur clustered around her menses, sometimes with up to 20 episodes per week. She has been on multiple different antiepileptic medications with continued seizures. She was therefore referred for possible VNS implantation.  Last cardiology visit was on 09/20/2024 with Majed, Al-Ghandour, PA with [facility] for follow-up dysautonomia (POTS with tachycardia, dizziness, and post-seizure bradycardia). Managed with nadolol, causing  fatigue. She supplements with caffeine pills to counteract fatigue. She had tried Concerta but switched to Vyvanse  due to side effects, including hallucination. Vyvanse  was effective, but caused tachycardia. Suboptimal fluid and salt intake. Compression stockings beneficial. Exercise limited by symptoms. B-blocker stopped temporarily after post-seizure bradycardia event. APP noted neurology looking into implanting a vagus nerve stimulator. Cardiology follow-up planned in six months.   Anesthesia team to evaluate on the day of surgery.   VS: BP 113/82   Pulse 74   Temp 36.8 C (Oral)   Resp 18   Ht 5' 10 (1.778 m)   Wt 77.1 kg   LMP 09/08/2024 (Approximate)   SpO2 95%   BMI 24.39 kg/m    PROVIDERS: PCP - [facility] Cardiologist - Nile Nanny, MD, [facility] [place]  Neurologist - Glory Euna, MD ([facility])   LABS: She had labs through [facility] 09/24/2024-09/26/2024. See Care Everywhere, but results include sodium 141, potassium 3.4, calcium 8.4, ALT 7, AST 13, BUN 10, creatinine 0.92, glucose 83, WBC 6.8, hemoglobin 10.4, hematocrit 32.7, platelet count 354, TSH 0.83, A1c 4.9%.   OTHER: EEG awake and drowsy 06/14/2023 ([facility]): Interpretation:   - This is normal awake and asleep EEG.  - Clinical Correlation: While a normal EEG does not rule out epilepsy, there was no evidence of an underlying seizure disorder on this study.      Sleep Study 10/23/2020 & 10/24/2020 (as outlined in [facility] 11/01/2020 note by Rumalda Salines, PA): diagnostic NPSG/MSLT on 10/23/2020 and 12/222021 respectively. She had an episode of body weakness as per recent documentation. Test results discussed at length.   her AHI is 0.1. her minimum oxygen saturation is 92% while the average was 97%. she spent 0 minutes with oxygen saturation <89%. her EKG showed essentially NSR  with an average rate of 76 bpm. No significant snoring and no  significant PLMs were observed. Latency to the first REM. Was 41.3 minutes.  Her MSLT revealed an average SOL of 1.8 minutes. She had 0 sleep onset REM periods (SOREMPs).   IMAGES: CT Head 09/08/2024 ([facility]): IMPRESSION:  No acute intracranial abnormality. No obvious cause for the patient's seizures.   CXR 07/27/2024 ([facility]): FINDINGS:  Cardiovascular: Cardiac silhouette and pulmonary vasculature are within normal limits.  Mediastinum: Within normal limits.  Lungs/pleura: No focal consolidations. No pleural effusion or pneumothorax.  Upper abdomen: Visualized portions are unremarkable.  Chest wall/osseous structures: Interval osseous changes.  IMPRESSION: There is no evidence of acute cardiac or pulmonary abnormality.   EKG: 09/14/2024 ([facility]): Tracing requested. Per Narrative in [place] [facility]: Normal sinus rhythm  Incomplete right bundle branch block  Nonspecific ST and T wave abnormality  Abnormal ECG  When compared with ECG of 14-Sep-2024 17:26,  No significant change was found  Roby Hacker ([id number]) on 09/24/2024 9:16:54 PM    CV: Echo 09/18/2023 [facility]): Summary   1. The left ventricle is normal in size with normal wall thickness.    2. There is normal left ventricular diastolic function.    3. The left ventricular systolic function is normal, LVEF is visually  estimated at > 55%.    4. The right ventricle is normal in size, with normal systolic function.    5. There are no significant valvular abnormalities.    6. There is no pericardial effusion.    EP Tilt Table Test 09/16/2023 [facility]): Impression:  This tilt table test is probably consistent with postural orthostatic tachycardia syndrome.    30 day cardiac event monitor 01/09/2023 ([facility]): - The patient was monitored for a total of 12d 16h, underlying rhythm is Sinus. The minimum heart rate was 50 bpm; the maximum 162 bpm; the average 88 bpm.  - 0 % of Atrial fibrillation/Atrial flutter. - The  total burden of AV Block present was 0 % [Complete Heart Block: 0 %; Advanced (High Grade): 0 %; 2nd Degree, Mobitz II: 0 %; 2nd Degree, Mobitz I: 0 %].  - There were 0 pauses. - Total count of Ventricular Tachycardia (VT): 0 episode(s).  - 0 supraventricular episodes were found.  - There were a total of 5 PVCs with 1 morphologies and 0 couplets. Overall PVC Burden at < 0.01 % There were a total of 0 Other Beats. There were 0 total number of paced beats.  There were a total of 118 PSVCs with 1 morphologies and 0 couplets. Overall PSVC Burden at < 0.01 %  - There is a total of 3 patient events.    Past Medical History:  Diagnosis Date   ACL (anterior cruciate ligament) tear    left   Anemia    Anxiety    Bipolar disorder (HCC)    Depression    Headache    Neuromuscular disorder (HCC)    Obsessive compulsive disorder    Paranoia (HCC)    S/P tubal ligation 03/26/2020   SVD (spontaneous vaginal delivery) 03/26/2020    Past Surgical History:  Procedure Laterality Date   ANTERIOR CRUCIATE LIGAMENT REPAIR Left    knee   COSMETIC SURGERY     head   TUBAL LIGATION N/A 03/26/2020   Procedure: POST PARTUM TUBAL LIGATION;  Surgeon: Danielle Rom, MD;  Location: [facility] LD ORS;  Service: Gynecology;  Laterality: N/A;   WISDOM TOOTH EXTRACTION Bilateral 11/03/1998  x4    MEDICATIONS:  Biotin  10 MG CAPS   buPROPion  (WELLBUTRIN  XL) 150 MG 24 hr tablet   desvenlafaxine  (PRISTIQ ) 50 MG 24 hr tablet   lamoTRIgine  (LAMICTAL ) 150 MG tablet   lisdexamfetamine  (VYVANSE ) 50 MG capsule   lurasidone  (LATUDA ) 40 MG TABS tablet   nebivolol (BYSTOLIC) 2.5 MG tablet   sertraline  (ZOLOFT ) 100 MG tablet   topiramate (TOPAMAX) 100 MG tablet   VALTOCO 20 MG DOSE 2 x 10 MG/0.1ML LQPK   No current facility-administered medications for this encounter.   Isaiah Ruder, PA-C Surgical Short Stay/Anesthesiology [facility] Phone [phone] [facility] Phone [phone] 10/18/2024 3:15 PM

## 2024-10-20 ENCOUNTER — Other Ambulatory Visit (HOSPITAL_COMMUNITY): Payer: Self-pay

## 2024-10-20 ENCOUNTER — Ambulatory Visit (HOSPITAL_COMMUNITY)
Admission: RE | Admit: 2024-10-20 | Discharge: 2024-10-20 | Disposition: A | Attending: Neurosurgery | Admitting: Neurosurgery

## 2024-10-20 ENCOUNTER — Encounter: Admission: RE | Disposition: A | Payer: Self-pay | Attending: Neurosurgery

## 2024-10-20 ENCOUNTER — Ambulatory Visit (HOSPITAL_COMMUNITY): Admitting: Vascular Surgery

## 2024-10-20 ENCOUNTER — Ambulatory Visit (HOSPITAL_COMMUNITY): Admitting: Anesthesiology

## 2024-10-20 DIAGNOSIS — Z79899 Other long term (current) drug therapy: Secondary | ICD-10-CM | POA: Insufficient documentation

## 2024-10-20 DIAGNOSIS — F319 Bipolar disorder, unspecified: Secondary | ICD-10-CM | POA: Diagnosis not present

## 2024-10-20 DIAGNOSIS — G40919 Epilepsy, unspecified, intractable, without status epilepticus: Secondary | ICD-10-CM | POA: Diagnosis not present

## 2024-10-20 DIAGNOSIS — F418 Other specified anxiety disorders: Secondary | ICD-10-CM | POA: Diagnosis not present

## 2024-10-20 DIAGNOSIS — F419 Anxiety disorder, unspecified: Secondary | ICD-10-CM | POA: Insufficient documentation

## 2024-10-20 DIAGNOSIS — G40219 Localization-related (focal) (partial) symptomatic epilepsy and epileptic syndromes with complex partial seizures, intractable, without status epilepticus: Secondary | ICD-10-CM

## 2024-10-20 HISTORY — PX: VAGUS NERVE STIMULATOR INSERTION: SHX348

## 2024-10-20 LAB — POCT PREGNANCY, URINE: Preg Test, Ur: NEGATIVE

## 2024-10-20 SURGERY — VAGAL NERVE STIMULATOR IMPLANT
Anesthesia: General

## 2024-10-20 MED ORDER — LIDOCAINE-EPINEPHRINE 1 %-1:100000 IJ SOLN
INTRAMUSCULAR | Status: DC | PRN
  Administered 2024-10-20: 14:00:00 4 mL

## 2024-10-20 MED ORDER — CELECOXIB 200 MG PO CAPS
200.0000 mg | ORAL_CAPSULE | Freq: Once | ORAL | Status: AC
  Administered 2024-10-20: 11:00:00 200 mg via ORAL
  Filled 2024-10-20: qty 1

## 2024-10-20 MED ORDER — PROPOFOL 10 MG/ML IV BOLUS
INTRAVENOUS | Status: AC
  Filled 2024-10-20: qty 20

## 2024-10-20 MED ORDER — ROCURONIUM BROMIDE 10 MG/ML (PF) SYRINGE
PREFILLED_SYRINGE | INTRAVENOUS | Status: AC
  Filled 2024-10-20: qty 10

## 2024-10-20 MED ORDER — CHLORHEXIDINE GLUCONATE CLOTH 2 % EX PADS
6.0000 | MEDICATED_PAD | Freq: Once | CUTANEOUS | Status: AC
  Administered 2024-10-20: 11:00:00 6 via TOPICAL

## 2024-10-20 MED ORDER — FENTANYL CITRATE (PF) 100 MCG/2ML IJ SOLN
INTRAMUSCULAR | Status: AC
  Filled 2024-10-20: qty 2

## 2024-10-20 MED ORDER — FENTANYL CITRATE (PF) 250 MCG/5ML IJ SOLN
INTRAMUSCULAR | Status: DC | PRN
  Administered 2024-10-20 (×2): 50 ug via INTRAVENOUS

## 2024-10-20 MED ORDER — SUGAMMADEX SODIUM 200 MG/2ML IV SOLN
INTRAVENOUS | Status: AC
  Filled 2024-10-20: qty 2

## 2024-10-20 MED ORDER — CEFAZOLIN SODIUM-DEXTROSE 2-4 GM/100ML-% IV SOLN
2.0000 g | INTRAVENOUS | Status: AC
  Administered 2024-10-20: 13:00:00 2 g via INTRAVENOUS
  Filled 2024-10-20: qty 100

## 2024-10-20 MED ORDER — THROMBIN 5000 UNITS EX SOLR
CUTANEOUS | Status: DC | PRN
  Administered 2024-10-20: 15:00:00 5 mL via TOPICAL

## 2024-10-20 MED ORDER — PHENYLEPHRINE HCL-NACL 20-0.9 MG/250ML-% IV SOLN
INTRAVENOUS | Status: DC | PRN
  Administered 2024-10-20: 15:00:00 40 ug/min via INTRAVENOUS

## 2024-10-20 MED ORDER — BUPIVACAINE HCL 0.5 % IJ SOLN
INTRAMUSCULAR | Status: DC | PRN
  Administered 2024-10-20: 14:00:00 4 mL

## 2024-10-20 MED ORDER — THROMBIN 5000 UNITS EX KIT
PACK | CUTANEOUS | Status: AC
  Filled 2024-10-20: qty 1

## 2024-10-20 MED ORDER — ROCURONIUM BROMIDE 10 MG/ML (PF) SYRINGE
PREFILLED_SYRINGE | INTRAVENOUS | Status: DC | PRN
  Administered 2024-10-20: 14:00:00 20 mg via INTRAVENOUS
  Administered 2024-10-20 (×2): 10 mg via INTRAVENOUS
  Administered 2024-10-20: 13:00:00 60 mg via INTRAVENOUS

## 2024-10-20 MED ORDER — PROPOFOL 10 MG/ML IV BOLUS
INTRAVENOUS | Status: DC | PRN
  Administered 2024-10-20: 13:00:00 140 mg via INTRAVENOUS

## 2024-10-20 MED ORDER — PHENYLEPHRINE 80 MCG/ML (10ML) SYRINGE FOR IV PUSH (FOR BLOOD PRESSURE SUPPORT)
PREFILLED_SYRINGE | INTRAVENOUS | Status: DC | PRN
  Administered 2024-10-20 (×3): 80 ug via INTRAVENOUS
  Administered 2024-10-20: 15:00:00 160 ug via INTRAVENOUS
  Administered 2024-10-20 (×2): 80 ug via INTRAVENOUS

## 2024-10-20 MED ORDER — CHLORHEXIDINE GLUCONATE CLOTH 2 % EX PADS: 6.0000 | MEDICATED_PAD | Freq: Once | CUTANEOUS | Status: DC

## 2024-10-20 MED ORDER — 0.9 % SODIUM CHLORIDE (POUR BTL) OPTIME
TOPICAL | Status: DC | PRN
  Administered 2024-10-20: 15:00:00 1000 mL

## 2024-10-20 MED ORDER — MIDAZOLAM HCL (PF) 2 MG/2ML IJ SOLN
INTRAMUSCULAR | Status: DC | PRN
  Administered 2024-10-20: 13:00:00 2 mg via INTRAVENOUS

## 2024-10-20 MED ORDER — CHLORHEXIDINE GLUCONATE 0.12 % MT SOLN
OROMUCOSAL | Status: AC
  Administered 2024-10-20: 11:00:00 15 mL
  Filled 2024-10-20: qty 15

## 2024-10-20 MED ORDER — MIDAZOLAM HCL 2 MG/2ML IJ SOLN
INTRAMUSCULAR | Status: AC
  Filled 2024-10-20: qty 2

## 2024-10-20 MED ORDER — ACETAMINOPHEN 500 MG PO TABS
1000.0000 mg | ORAL_TABLET | Freq: Once | ORAL | Status: AC
  Administered 2024-10-20: 11:00:00 1000 mg via ORAL
  Filled 2024-10-20: qty 2

## 2024-10-20 MED ORDER — EPHEDRINE SULFATE-NACL 50-0.9 MG/10ML-% IV SOSY
PREFILLED_SYRINGE | INTRAVENOUS | Status: DC | PRN
  Administered 2024-10-20 (×3): 5 mg via INTRAVENOUS
  Administered 2024-10-20: 14:00:00 10 mg via INTRAVENOUS

## 2024-10-20 MED ORDER — LIDOCAINE 2% (20 MG/ML) 5 ML SYRINGE
INTRAMUSCULAR | Status: AC
  Filled 2024-10-20: qty 5

## 2024-10-20 MED ORDER — DEXMEDETOMIDINE HCL IN NACL 80 MCG/20ML IV SOLN
INTRAVENOUS | Status: DC | PRN
  Administered 2024-10-20: 15:00:00 8 ug via INTRAVENOUS

## 2024-10-20 MED ORDER — SUGAMMADEX SODIUM 200 MG/2ML IV SOLN
INTRAVENOUS | Status: DC | PRN
  Administered 2024-10-20: 16:00:00 150 mg via INTRAVENOUS

## 2024-10-20 MED ORDER — BUPIVACAINE HCL (PF) 0.5 % IJ SOLN
INTRAMUSCULAR | Status: AC
  Filled 2024-10-20: qty 30

## 2024-10-20 MED ORDER — LIDOCAINE 2% (20 MG/ML) 5 ML SYRINGE
INTRAMUSCULAR | Status: DC | PRN
  Administered 2024-10-20: 13:00:00 60 mg via INTRAVENOUS

## 2024-10-20 MED ORDER — LIDOCAINE-EPINEPHRINE 1 %-1:100000 IJ SOLN
INTRAMUSCULAR | Status: AC
  Filled 2024-10-20: qty 1

## 2024-10-20 MED ORDER — DEXAMETHASONE SOD PHOSPHATE PF 10 MG/ML IJ SOLN
INTRAMUSCULAR | Status: DC | PRN
  Administered 2024-10-20: 14:00:00 10 mg via INTRAVENOUS

## 2024-10-20 MED ORDER — HYDROCODONE-ACETAMINOPHEN 5-325 MG PO TABS
1.0000 | ORAL_TABLET | Freq: Four times a day (QID) | ORAL | 0 refills | Status: AC | PRN
  Filled 2024-10-20: qty 12, 3d supply, fill #0

## 2024-10-20 MED ORDER — LACTATED RINGERS IV SOLN: INTRAVENOUS | Status: DC | PRN

## 2024-10-20 MED ADMIN — Ondansetron HCl Inj 4 MG/2ML (2 MG/ML): 4 mg | INTRAVENOUS | @ 15:00:00 | NDC 60505613005

## 2024-10-20 SURGICAL SUPPLY — 48 items
BAG COUNTER SPONGE SURGICOUNT (BAG) ×1 IMPLANT
BAND RUBBER 3X1/6 STRL (MISCELLANEOUS) ×2 IMPLANT
BENZOIN TINCTURE PRP APPL 2/3 (GAUZE/BANDAGES/DRESSINGS) IMPLANT
BLADE CLIPPER SURG (BLADE) IMPLANT
BLADE SURG 11 STRL SS (BLADE) ×1 IMPLANT
CANISTER SUCTION 3000ML PPV (SUCTIONS) ×1 IMPLANT
DERMABOND ADVANCED .7 DNX12 (GAUZE/BANDAGES/DRESSINGS) ×1 IMPLANT
DRAPE CAMERA VIDEO/LASER (DRAPES) ×1 IMPLANT
DRAPE HALF SHEET 40X57 (DRAPES) IMPLANT
DRAPE LAPAROTOMY 100X72X124 (DRAPES) ×1 IMPLANT
DRAPE MICROSCOPE LEICA (MISCELLANEOUS) ×1 IMPLANT
DRSG OPSITE 4X5.5 SM (GAUZE/BANDAGES/DRESSINGS) ×2 IMPLANT
DRSG OPSITE POSTOP 3X4 (GAUZE/BANDAGES/DRESSINGS) IMPLANT
DRSG TEGADERM 4X4.75 (GAUZE/BANDAGES/DRESSINGS) ×4 IMPLANT
DURAPREP 6ML APPLICATOR 50/CS (WOUND CARE) ×1 IMPLANT
ELECT COATED BLADE 2.86 ST (ELECTRODE) ×1 IMPLANT
ELECTRODE REM PT RTRN 9FT ADLT (ELECTROSURGICAL) ×1 IMPLANT
GAUZE 4X4 16PLY ~~LOC~~+RFID DBL (SPONGE) IMPLANT
GENERATOR M1000 SENTIVA (Generator) IMPLANT
GLOVE BIO SURGEON STRL SZ7 (GLOVE) IMPLANT
GLOVE BIOGEL PI IND STRL 7.5 (GLOVE) ×3 IMPLANT
GLOVE BIOGEL PI MICRO STRL 7 (GLOVE) ×1 IMPLANT
GOWN STRL REUS W/ TWL LRG LVL3 (GOWN DISPOSABLE) ×2 IMPLANT
GOWN STRL REUS W/ TWL XL LVL3 (GOWN DISPOSABLE) IMPLANT
GOWN STRL REUS W/TWL 2XL LVL3 (GOWN DISPOSABLE) IMPLANT
HEMOSTAT POWDER KIT SURGIFOAM (HEMOSTASIS) ×1 IMPLANT
KIT BASIN OR (CUSTOM PROCEDURE TRAY) ×1 IMPLANT
KIT TURNOVER KIT B (KITS) ×1 IMPLANT
LEAD PERENNIAFLEX 2-3 304 (Neuro Prosthesis/Implant) IMPLANT
LOOP VASCLR MAXI BLUE 18IN ST (MISCELLANEOUS) IMPLANT
NDL HYPO 22X1.5 SAFETY MO (MISCELLANEOUS) ×1 IMPLANT
NEEDLE HYPO 22X1.5 SAFETY MO (MISCELLANEOUS) ×1 IMPLANT
PACK LAMINECTOMY NEURO (CUSTOM PROCEDURE TRAY) ×1 IMPLANT
PAD ARMBOARD POSITIONER FOAM (MISCELLANEOUS) ×3 IMPLANT
SOLN 0.9% NACL POUR BTL 1000ML (IV SOLUTION) ×1 IMPLANT
SOLN STERILE WATER BTL 1000 ML (IV SOLUTION) ×1 IMPLANT
SPIKE FLUID TRANSFER (MISCELLANEOUS) ×1 IMPLANT
SPONGE INTESTINAL PEANUT (DISPOSABLE) IMPLANT
SPONGE SURGIFOAM ABS GEL SZ50 (HEMOSTASIS) IMPLANT
SUT 3-0 BLK 1X30 PSL (SUTURE) IMPLANT
SUT NURALON 4 0 TR CR/8 (SUTURE) ×1 IMPLANT
SUT VIC AB 0 CT1 27XBRD ANBCTR (SUTURE) IMPLANT
SUT VIC AB 3-0 SH 8-18 (SUTURE) ×1 IMPLANT
SUT VICRYL 3-0 RB1 18 ABS (SUTURE) ×2 IMPLANT
TOWEL GREEN STERILE (TOWEL DISPOSABLE) ×1 IMPLANT
TOWEL GREEN STERILE FF (TOWEL DISPOSABLE) ×1 IMPLANT
TUNNELING TOOL (MISCELLANEOUS) IMPLANT
VASCULAR TIE MINI RED 18IN STL (MISCELLANEOUS) IMPLANT

## 2024-10-20 NOTE — Op Note (Signed)
°  NEUROSURGERY OPERATIVE NOTE   PREOP DIAGNOSIS: Medically intractable epilepsy   POSTOP DIAGNOSIS: Same  PROCEDURE: 1. Placement of left vagus nerve stimulator  SURGEON: Dr. Gerldine Maizes, MD  ASSISTANT: Camie Pickle, PA-C  ANESTHESIA: General Endotracheal  EBL: Minimal  SPECIMENS: None  DRAINS: None  COMPLICATIONS: None immediate  CONDITION: Hemodynamically stable to PACU  HISTORY: Cheryl Acevedo is a 33 y.o.  female who was initially seen in the outpatient clinic as a referral from neurology for medically intractable epilepsy. She continues to have seizures despite AEDs. The patient appears to be a good candidate for the procedure. The risks, benefits, and alternatives to the surgery were reviewed in detail. After all questions were answered, informed consent was obtained.  PROCEDURE IN DETAIL: After informed consent was obtained and witnessed, the patient was brought to the operating room. After induction of general anesthesia, the patient was positioned on the operative table in the supine position. All pressure points were meticulously padded. Skin incisions were then marked out and prepped and draped in the usual sterile fashion.  After timeout was conducted, left transverse neck skin incision was infiltrated with local anesthetic with epinephrine . Skin incision was then made sharply, and Bovie electrocautery was used to dissect the subcutaneous tissue. The platysma muscle was identified and incised. The platysma was then undermined. The medial border of the sternocleidomastoid muscle was identified, and dissection was carried out utilizing natural tissue planes of the neck until the carotid sheath was identified. The jugular vein was initially identified and the carotid sheath was incised. The vagus nerve was then identified running between the carotid and jugular. The vagus nerve was circumferentially dissected for length of a proximally 2-1/2 cm. The infraclavicular  incision was then infiltrated with local and opened sharply, and Bovie electrocautery was used to dissect the subcutaneous tissue. A subcutaneous pocket was then made. A subcutaneous tunneler was then passed from the neck to the infraclavicular incision. The vagal stimulator leads were then tunneled. The leads were then placed on the vagus nerve. The generator was then connected to the leads, and placed in the pocket.  Testing was done to confirm normal impedance, and good placement and heart rate detection.   Wounds were then irrigated with copious amounts of normal saline irrigation. Relaxing curl was placed on the neck leads and tacked to the SCM. The platysma was closed using interrupted 3-0 Vicryl stitches. Subcutaneous layer and the subclavicular incision was closed using interrupted 3-0 Vicryl stitches. Skin was then closed using interrupted 3-0 Vicryl, and a layer of Dermabond was applied.   At the end of the case all sponge, needle, cottonoid, and instrument counts were correct. The patient was then extubated, transferred to the stretcher, and taken to the postanesthesia care unit in stable hemodynamic condition.   Gerldine Maizes, MD [facility]

## 2024-10-20 NOTE — Discharge Summary (Signed)
 Physician Discharge Summary  Patient ID: Cheryl Acevedo MRN: [id number] DOB/AGE: Mar 19, 1991 33 y.o.  Admit date: 10/20/2024 Discharge date: 10/20/2024  Admission Diagnoses:  Medically intractable epilepsy  Discharge Diagnoses:  Same Active Problems:   * No active hospital problems. *   Discharged Condition: Stable  Hospital Course:  Cheryl Acevedo is a 33 y.o. female who underwent uncomplicated placement of a left VNS. She was at baseline postop and discharged home from PACU in stable condition.  Treatments: Surgery - Placement of left VNS  Discharge Exam: Blood pressure 108/73, pulse 65, temperature 98.5 F (36.9 C), temperature source Oral, resp. rate 17, height 5' 10 (1.778 m), weight 75.8 kg, SpO2 100%. Awake, alert, oriented Speech fluent, appropriate CN grossly intact 5/5 BUE/BLE Wound c/d/i  Disposition: Discharge disposition: 01-Home or Self Care       Discharge Instructions     Call MD for:  redness, tenderness, or signs of infection (pain, swelling, redness, odor or green/yellow discharge around incision site)   Complete by: As directed    Call MD for:  temperature >100.4   Complete by: As directed    Discharge instructions   Complete by: As directed    Walk at home as much as possible, at least 4 times / day   Increase activity slowly   Complete by: As directed    Lifting restrictions   Complete by: As directed    No lifting > 10 lbs   May shower / Bathe   Complete by: As directed    48 hours after surgery   May walk up steps   Complete by: As directed    Other Restrictions   Complete by: As directed    No bending/twisting at waist   Remove dressing in 48 hours   Complete by: As directed       Allergies as of 10/20/2024   No Known Allergies      Medication List     TAKE these medications    Biotin  10 MG Caps Take 10 mg by mouth daily.   buPROPion  150 MG 24 hr tablet Commonly known as: WELLBUTRIN  XL Take 1 tablet (150 mg  total) by mouth daily.   Cenobamate 25 MG Tabs Take 25 mg by mouth at bedtime.   desvenlafaxine  50 MG 24 hr tablet Commonly known as: PRISTIQ  Take 50 mg by mouth daily.   HYDROcodone -acetaminophen  5-325 MG tablet Commonly known as: NORCO/VICODIN Take 1 tablet by mouth every 6 (six) hours as needed for up to 3 days.   lamoTRIgine  150 MG tablet Commonly known as: LAMICTAL  Take 150 mg by mouth 2 (two) times daily.   lisdexamfetamine  50 MG capsule Commonly known as: VYVANSE  Take 50 mg by mouth daily.   lurasidone  40 MG Tabs tablet Commonly known as: LATUDA  Take 40 mg by mouth at bedtime.   nebivolol 2.5 MG tablet Commonly known as: BYSTOLIC Take 2.5 mg by mouth daily.   sertraline  100 MG tablet Commonly known as: ZOLOFT  Take 150 mg by mouth daily.   topiramate 100 MG tablet Commonly known as: TOPAMAX Take 100 mg by mouth at bedtime.   Valtoco 20 MG Dose 2 x 10 MG/0.1ML Lqpk Generic drug: diazePAM (20 MG Dose) Place 1 spray into the nose daily as needed (seizure).        Follow-up Information     Lanis Pupa, MD Follow up in 3 week(s).   Specialty: Neurosurgery Contact information: [address] [place] [address] [phone]  SignedBETHA Gerldine JAYSON Lanis 10/20/2024, 12:25 PM

## 2024-10-20 NOTE — Transfer of Care (Signed)
 Immediate Anesthesia Transfer of Care Note  Patient: Cheryl Acevedo  Procedure(s) Performed: PLACEMENT OF VAGAL NERVE STIMULATOR IMPLANT  Patient Location: PACU  Anesthesia Type:General  Level of Consciousness: drowsy and patient cooperative  Airway & Oxygen Therapy: Patient Spontanous Breathing and Patient connected to nasal cannula oxygen  Post-op Assessment: Report given to RN and Post -op Vital signs reviewed and stable  Post vital signs: Reviewed and stable  Last Vitals:  Vitals Value Taken Time  BP 107/64 10/20/24 15:48  Temp 98.3   Pulse 81 10/20/24 15:50  Resp 15 10/20/24 15:50  SpO2 100 % 10/20/24 15:50  Vitals shown include unfiled device data.  Last Pain:  Vitals:   10/20/24 1107  TempSrc:   PainSc: 3          Complications: No notable events documented.

## 2024-10-20 NOTE — Anesthesia Procedure Notes (Signed)
 Procedure Name: Intubation Date/Time: 10/20/2024 1:26 PM  Performed by: Atanacio Arland HERO, CRNAPre-anesthesia Checklist: Patient identified, Emergency Drugs available, Suction available and Patient being monitored Patient Re-evaluated:Patient Re-evaluated prior to induction Oxygen Delivery Method: Circle System Utilized Preoxygenation: Pre-oxygenation with 100% oxygen Induction Type: IV induction Ventilation: Mask ventilation without difficulty Laryngoscope Size: Mac and 4 Grade View: Grade I Tube type: Oral Tube size: 7.0 mm Number of attempts: 1 Airway Equipment and Method: Stylet Placement Confirmation: ETT inserted through vocal cords under direct vision, positive ETCO2 and breath sounds checked- equal and bilateral Secured at: 22 cm Tube secured with: Tape Dental Injury: Teeth and Oropharynx as per pre-operative assessment

## 2024-10-20 NOTE — H&P (Signed)
° °  Chief Complaint   epilepsy  History of Present Illness  Cheryl Acevedo is a 33 y.o. female with a history of medically, intractable, epilepsy, under the care of an apologist. She has been on multiple different antiepileptic medications with continued seizures. She was therefore referred for vagus nerve stimulator placement.  Past Medical History  Past Medical History:  Diagnosis Date   ACL (anterior cruciate ligament) tear    left   Anemia    Anxiety    Bipolar disorder (HCC)    Depression    Headache    Neuromuscular disorder (HCC)    Obsessive compulsive disorder    Paranoia (HCC)    S/P tubal ligation 03/26/2020   SVD (spontaneous vaginal delivery) 03/26/2020    Past Surgical History  Past Surgical History:  Procedure Laterality Date   ANTERIOR CRUCIATE LIGAMENT REPAIR Left    knee   COSMETIC SURGERY     head   TUBAL LIGATION N/A 03/26/2020   Procedure: POST PARTUM TUBAL LIGATION;  Surgeon: Danielle Rom, MD;  Location: [facility] ORS;  Service: Gynecology;  Laterality: N/A;   WISDOM TOOTH EXTRACTION Bilateral 11/03/1998   x4    Social History  Social History   Tobacco Use   Smoking status: Never   Smokeless tobacco: Never  Vaping Use   Vaping status: Never Used  Substance Use Topics   Alcohol use: No    Comment: Occasional   Drug use: No    Medications   Prior to Admission medications  Medication Sig Start Date End Date Taking? Authorizing Provider  Biotin  10 MG CAPS Take 10 mg by mouth daily.   Yes [provider]  buPROPion  (WELLBUTRIN  XL) 150 MG 24 hr tablet Take 1 tablet (150 mg total) by mouth daily. 08/09/21  Yes Drusilla Sabas RAMAN, MD  lamoTRIgine  (LAMICTAL ) 150 MG tablet Take 150 mg by mouth 2 (two) times daily.   Yes [provider]  lisdexamfetamine  (VYVANSE ) 50 MG capsule Take 50 mg by mouth daily.   Yes [provider]  lurasidone  (LATUDA ) 40 MG TABS tablet Take 40 mg by mouth at bedtime.  09/22/24  Yes [provider]  nebivolol (BYSTOLIC) 2.5 MG tablet Take 2.5 mg by mouth daily.   Yes [provider]  sertraline  (ZOLOFT ) 100 MG tablet Take 150 mg by mouth daily.   Yes [provider]  topiramate (TOPAMAX) 100 MG tablet Take 100 mg by mouth at bedtime. 04/20/24 10/28/24 Yes [provider]  VALTOCO 20 MG DOSE 2 x 10 MG/0.1ML LQPK Place 1 spray into the nose daily as needed (seizure). 09/16/24  Yes [provider]  desvenlafaxine  (PRISTIQ ) 50 MG 24 hr tablet Take 50 mg by mouth daily.    [provider]    Allergies  No Known Allergies  Review of Systems  ROS  Neurologic Exam  alert oriented. Cranial nerves grossly intact. Good strength, all extremities  Imaging  none relevant  Impression  - 33 y.o. female with medically, intractable epilepsy, who appears to be a candidate for [facility] nerve stimulator placement  Plan  - proceed with placement of left vagus nerve stimulator.  I have reviewed the details of the procedure, alternatives, expected postoperative, of course, and risks of the procedure at linked with the patient in the office. All her questions were answered. She provided informed consent to proceed.

## 2024-10-21 ENCOUNTER — Encounter (HOSPITAL_COMMUNITY): Payer: Self-pay | Admitting: Neurosurgery

## 2024-10-21 NOTE — Anesthesia Postprocedure Evaluation (Signed)
"   Anesthesia Post Note  Patient: Cheryl Acevedo  Procedure(s) Performed: PLACEMENT OF VAGAL NERVE STIMULATOR IMPLANT     Patient location during evaluation: PACU Anesthesia Type: General Level of consciousness: awake and alert Pain management: pain level controlled Vital Signs Assessment: post-procedure vital signs reviewed and stable Respiratory status: spontaneous breathing, nonlabored ventilation, respiratory function stable and patient connected to nasal cannula oxygen Cardiovascular status: blood pressure returned to baseline and stable Postop Assessment: no apparent nausea or vomiting Anesthetic complications: no   No notable events documented.  Last Vitals:  Vitals:   10/20/24 1630 10/20/24 1645  BP: 103/60 101/60  Pulse: 79 79  Resp: 15 16  Temp:  36.7 C  SpO2: 96% 98%    Last Pain:  Vitals:   10/20/24 1645  TempSrc:   PainSc: 0-No pain                 Cordella P Akhilesh Sassone      "
